# Patient Record
Sex: Female | Born: 1935
Health system: Southern US, Community
[De-identification: ages and names within clinical notes are randomized; demographics above are authoritative.]

## PROBLEM LIST (undated history)

## (undated) DIAGNOSIS — I509 Heart failure, unspecified: Secondary | ICD-10-CM

## (undated) DIAGNOSIS — N289 Disorder of kidney and ureter, unspecified: Secondary | ICD-10-CM

## (undated) DIAGNOSIS — E785 Hyperlipidemia, unspecified: Secondary | ICD-10-CM

## (undated) DIAGNOSIS — I10 Essential (primary) hypertension: Secondary | ICD-10-CM

## (undated) HISTORY — DX: Hyperlipidemia, unspecified: E78.5

## (undated) HISTORY — DX: Essential (primary) hypertension: I10

## (undated) HISTORY — PX: POLYPECTOMY: SHX149

## (undated) HISTORY — PX: COLONOSCOPY: SHX174

## (undated) HISTORY — PX: ABDOMINAL HYSTERECTOMY: SHX81

## (undated) HISTORY — PX: KNEE SURGERY: SHX244

## (undated) SURGERY — HEMIARTHROPLASTY, HIP, DIRECT ANTERIOR APPROACH, FOR FRACTURE
Anesthesia: Choice | Laterality: Right

---

## 1999-11-26 ENCOUNTER — Other Ambulatory Visit: Admission: RE | Admit: 1999-11-26 | Discharge: 1999-11-26 | Payer: Self-pay | Admitting: Obstetrics and Gynecology

## 1999-12-01 ENCOUNTER — Encounter: Admission: RE | Admit: 1999-12-01 | Discharge: 1999-12-01 | Payer: Self-pay | Admitting: Obstetrics and Gynecology

## 1999-12-01 ENCOUNTER — Encounter: Payer: Self-pay | Admitting: Obstetrics and Gynecology

## 2000-12-08 ENCOUNTER — Encounter: Payer: Self-pay | Admitting: Obstetrics and Gynecology

## 2000-12-08 ENCOUNTER — Encounter: Admission: RE | Admit: 2000-12-08 | Discharge: 2000-12-08 | Payer: Self-pay | Admitting: Obstetrics and Gynecology

## 2000-12-10 ENCOUNTER — Other Ambulatory Visit: Admission: RE | Admit: 2000-12-10 | Discharge: 2000-12-10 | Payer: Self-pay | Admitting: Obstetrics and Gynecology

## 2000-12-21 ENCOUNTER — Encounter: Admission: RE | Admit: 2000-12-21 | Discharge: 2000-12-21 | Payer: Self-pay | Admitting: Obstetrics and Gynecology

## 2000-12-21 ENCOUNTER — Encounter: Payer: Self-pay | Admitting: Obstetrics and Gynecology

## 2002-01-11 ENCOUNTER — Encounter: Admission: RE | Admit: 2002-01-11 | Discharge: 2002-01-11 | Payer: Self-pay | Admitting: Internal Medicine

## 2002-01-11 ENCOUNTER — Encounter: Payer: Self-pay | Admitting: Internal Medicine

## 2003-01-02 ENCOUNTER — Encounter: Admission: RE | Admit: 2003-01-02 | Discharge: 2003-01-02 | Payer: Self-pay | Admitting: Internal Medicine

## 2003-01-02 ENCOUNTER — Encounter: Payer: Self-pay | Admitting: Internal Medicine

## 2004-01-04 ENCOUNTER — Encounter: Admission: RE | Admit: 2004-01-04 | Discharge: 2004-01-04 | Payer: Self-pay | Admitting: Internal Medicine

## 2005-01-05 ENCOUNTER — Encounter: Admission: RE | Admit: 2005-01-05 | Discharge: 2005-01-05 | Payer: Self-pay | Admitting: Internal Medicine

## 2005-01-26 ENCOUNTER — Ambulatory Visit: Payer: Self-pay | Admitting: Internal Medicine

## 2005-02-05 ENCOUNTER — Ambulatory Visit: Payer: Self-pay | Admitting: Internal Medicine

## 2005-02-05 ENCOUNTER — Encounter (INDEPENDENT_AMBULATORY_CARE_PROVIDER_SITE_OTHER): Payer: Self-pay | Admitting: Specialist

## 2005-07-07 ENCOUNTER — Ambulatory Visit (HOSPITAL_COMMUNITY): Admission: RE | Admit: 2005-07-07 | Discharge: 2005-07-07 | Payer: Self-pay | Admitting: Internal Medicine

## 2006-01-06 ENCOUNTER — Encounter: Admission: RE | Admit: 2006-01-06 | Discharge: 2006-01-06 | Payer: Self-pay | Admitting: Internal Medicine

## 2006-04-01 ENCOUNTER — Ambulatory Visit: Payer: Self-pay | Admitting: Internal Medicine

## 2006-04-14 ENCOUNTER — Ambulatory Visit: Payer: Self-pay | Admitting: Internal Medicine

## 2006-04-14 ENCOUNTER — Encounter (INDEPENDENT_AMBULATORY_CARE_PROVIDER_SITE_OTHER): Payer: Self-pay | Admitting: Specialist

## 2007-01-10 ENCOUNTER — Encounter: Admission: RE | Admit: 2007-01-10 | Discharge: 2007-01-10 | Payer: Self-pay | Admitting: Internal Medicine

## 2008-01-11 ENCOUNTER — Encounter: Admission: RE | Admit: 2008-01-11 | Discharge: 2008-01-11 | Payer: Self-pay | Admitting: Internal Medicine

## 2009-01-11 ENCOUNTER — Encounter: Admission: RE | Admit: 2009-01-11 | Discharge: 2009-01-11 | Payer: Self-pay | Admitting: Internal Medicine

## 2009-03-07 ENCOUNTER — Encounter (INDEPENDENT_AMBULATORY_CARE_PROVIDER_SITE_OTHER): Payer: Self-pay | Admitting: *Deleted

## 2009-09-06 ENCOUNTER — Encounter (INDEPENDENT_AMBULATORY_CARE_PROVIDER_SITE_OTHER): Payer: Self-pay | Admitting: *Deleted

## 2009-09-09 ENCOUNTER — Ambulatory Visit: Payer: Self-pay | Admitting: Internal Medicine

## 2009-09-09 ENCOUNTER — Encounter (INDEPENDENT_AMBULATORY_CARE_PROVIDER_SITE_OTHER): Payer: Self-pay | Admitting: *Deleted

## 2009-09-20 ENCOUNTER — Telehealth: Payer: Self-pay | Admitting: Internal Medicine

## 2010-01-13 ENCOUNTER — Encounter: Admission: RE | Admit: 2010-01-13 | Discharge: 2010-01-13 | Payer: Self-pay | Admitting: Internal Medicine

## 2010-07-15 NOTE — Letter (Signed)
Summary: Diabetic Instructions  [facility]  [place], [place] [address]   Phone: [phone]  Fax: [phone]    Abigail Huang 1930/08/06 MRN: [id number]   _  _   ORAL DIABETIC MEDICATION INSTRUCTIONS  The day before your procedure:   Take your diabetic pill as you do normally  The day of your procedure:   Do not take your diabetic pill    We will check your blood sugar levels during the admission process and again in Recovery before discharging you home  ________________________________________________________________________

## 2010-07-15 NOTE — Letter (Signed)
Summary: [facility] Instructions  [facility]  [place], [place] [address]   Phone: [phone]  Fax: [phone]       SHANEN MACFADDEN    [birth year]    MRN: [id number]        Procedure Day Sudie Grumbling:  Wednesday 09/25/2009     Arrival Time: 7:30 am      Procedure Time: 8:30 am     Location of Procedure:                    _x _  [place] (4th Floor)                        [place]   Starting 5 days prior to your procedure Friday 4/8 do not eat nuts, seeds, popcorn, corn, beans, peas,  salads, or any raw vegetables.  Do not take any fiber supplements (e.g. Metamucil, Citrucel, and Benefiber).  THE DAY BEFORE YOUR PROCEDURE         DATE: Tuesday 4/12  1.  Drink clear liquids the entire day-NO SOLID FOOD  2.  Do not drink anything colored red or purple.  Avoid juices with pulp.  No orange juice.  3.  Drink at least 64 oz. (8 glasses) of fluid/clear liquids during the day to prevent dehydration and help the prep work efficiently.  CLEAR LIQUIDS INCLUDE: Water Jello Ice Popsicles Tea (sugar ok, no milk/cream) Powdered fruit flavored drinks Coffee (sugar ok, no milk/cream) Gatorade Juice: apple, white grape, white cranberry  Lemonade Clear bullion, consomm, broth Carbonated beverages (any kind) Strained chicken noodle soup Hard Candy                             4.  In the morning, mix first dose of MoviPrep solution:    Empty 1 Pouch A and 1 Pouch B into the disposable container    Add lukewarm drinking water to the top line of the container. Mix to dissolve    Refrigerate (mixed solution should be used within 24 hrs)  5.  Begin drinking the prep at 5:00 p.m. The MoviPrep container is divided by 4 marks.   Every 15 minutes drink the solution down to the next mark (approximately 8 oz) until the full liter is complete.   6.  Follow completed prep with 16 oz of clear liquid of your choice (Nothing  red or purple).  Continue to drink clear liquids until bedtime.  7.  Before going to bed, mix second dose of MoviPrep solution:    Empty 1 Pouch A and 1 Pouch B into the disposable container    Add lukewarm drinking water to the top line of the container. Mix to dissolve    Refrigerate  THE DAY OF YOUR PROCEDURE      DATE: Wednesday 4/13  Beginning at 3:30 a.m. (5 hours before procedure):         1. Every 15 minutes, drink the solution down to the next mark (approx 8 oz) until the full liter is complete.  2. Follow completed prep with 16 oz. of clear liquid of your choice.    3. You may drink clear liquids until 6:30 am (2 HOURS BEFORE PROCEDURE).   MEDICATION INSTRUCTIONS  Unless otherwise instructed, you should take regular prescription medications with a small sip of water   as early as possible the morning of  your procedure.  Diabetic patients - see separate instructions.    Additional medication instructions:  Do not take Triam/HCTZ morning of procedure.         OTHER INSTRUCTIONS  You will need a responsible adult at least 75 years of age to accompany you and drive you home.   This person must remain in the waiting room during your procedure.  Wear loose fitting clothing that is easily removed.  Leave jewelry and other valuables at home.  However, you may wish to bring a book to read or  an iPod/MP3 player to listen to music as you wait for your procedure to start.  Remove all body piercing jewelry and leave at home.  Total time from sign-in until discharge is approximately 2-3 hours.  You should go home directly after your procedure and rest.  You can resume normal activities the  day after your procedure.  The day of your procedure you should not:   Drive   Make legal decisions   Operate machinery   Drink alcohol   Return to work  You will receive specific instructions about eating, activities and medications before you leave.    The above  instructions have been reviewed and explained to me by   Ulice Dash RN  September 09, 2009 11:12 AM     I fully understand and can verbalize these instructions _____________________________ Date _________

## 2010-07-15 NOTE — Miscellaneous (Signed)
Summary: [facility]  Clinical Lists Changes  Medications: Added new medication of MOVIPREP 100 GM  SOLR (PEG-KCL-NACL-NASULF-NA ASC-C) As per prep instructions. - Signed Rx of MOVIPREP 100 GM  SOLR (PEG-KCL-NACL-NASULF-NA ASC-C) As per prep instructions.;  #1 x 0;  Signed;  Entered by: Ulice Dash RN;  Authorized by: Irene Shipper MD;  Method used: Print then Give to Patient Observations: Added new observation of NKA: T (09/09/2009 10:46)    Prescriptions: MOVIPREP 100 GM  SOLR (PEG-KCL-NACL-NASULF-NA ASC-C) As per prep instructions.  #1 x 0   Entered by:   Ulice Dash RN   Authorized by:   Irene Shipper MD   Signed by:   Ulice Dash RN on 09/09/2009   Method used:   Print then Give to Patient   RxID:   [id number]

## 2010-07-15 NOTE — Progress Notes (Signed)
Summary: Moviprep too expensive  Phone Note Call from Patient Call back at [facility] Phone [phone]   Caller: Patient Call For: Dr. Henrene Pastor Reason for Call: Talk to Nurse Summary of Call: Moviprep is too expensive. Needs something less expensive Initial call taken by: Webb Laws,  September 20, 2009 10:45 AM    Offered pt. rebate coupon toward the cost of her Movi prep or miralax prep.  Explained to pt. she would need to come by for new prep instructions.  Pt. decided to cancel procedure for now and will do at a later time.

## 2010-08-25 ENCOUNTER — Encounter (INDEPENDENT_AMBULATORY_CARE_PROVIDER_SITE_OTHER): Payer: Self-pay | Admitting: *Deleted

## 2010-09-02 NOTE — Letter (Signed)
Summary: Pre Visit Letter Revised  [facility]  [place], [place] [address]   Phone: [phone]  Fax: [phone]        08/25/2010 MRN: [id number] Abigail Huang [address] [place], [place]  [address]             Procedure Date:  October 09, 2010   recall col-Dr Dora Sims to the Gastroenterology Division at [facility].    You are scheduled to see a nurse for your pre-procedure visit on September 25, 2010 at 9:00am on the 3rd floor at [facility] [place] [facility].  We ask that you try to arrive at our office 15 minutes prior to your appointment time to allow for check-in.  Please take a minute to review the attached form.  If you answer "Yes" to one or more of the questions on the first page, we ask that you call the person listed at your earliest opportunity.  If you answer "No" to all of the questions, please complete the rest of the form and bring it to your appointment.    Your nurse visit will consist of discussing your medical and surgical history, your immediate family medical history, and your medications.   If you are unable to list all of your medications on the form, please bring the medication bottles to your appointment and we will list them.  We will need to be aware of both prescribed and over the counter drugs.  We will need to know exact dosage information as well.    Please be prepared to read and sign documents such as consent forms, a financial agreement, and acknowledgement forms.  If necessary, and with your consent, a friend or relative is welcome to sit-in on the nurse visit with you.  Please bring your insurance card so that we may make a copy of it.  If your insurance requires a referral to see a specialist, please bring your referral form from your primary care physician.  No co-pay is required for this nurse visit.     If you cannot keep your appointment, please call [phone] to cancel or reschedule prior to your  appointment date.  This allows [place] the opportunity to schedule an appointment for another patient in need of care.    Thank you for choosing [facility] for your medical needs.  We appreciate the opportunity to care for you.  Please visit [place] at our website  to learn more about our practice.  Sincerely, The Gastroenterology Division

## 2010-09-25 ENCOUNTER — Ambulatory Visit (AMBULATORY_SURGERY_CENTER): Payer: Medicare HMO

## 2010-09-25 VITALS — Ht 67.0 in | Wt 153.3 lb

## 2010-09-25 DIAGNOSIS — Z86010 Personal history of colonic polyps: Secondary | ICD-10-CM

## 2010-09-25 MED ORDER — PEG-KCL-NACL-NASULF-NA ASC-C 100 G PO SOLR: ORAL | Status: DC

## 2010-09-26 ENCOUNTER — Telehealth: Payer: Self-pay | Admitting: *Deleted

## 2010-09-26 MED ORDER — PEG-KCL-NACL-NASULF-NA ASC-C 100 G PO SOLR: ORAL | Status: DC

## 2010-09-26 NOTE — Telephone Encounter (Signed)
Patient's insurance will not pay would need prior auth for Moviprep. Cancelled rx with [employer]. Sample given to patient

## 2010-10-08 ENCOUNTER — Encounter: Payer: Self-pay | Admitting: Internal Medicine

## 2010-10-09 ENCOUNTER — Encounter: Payer: Self-pay | Admitting: Internal Medicine

## 2010-10-09 ENCOUNTER — Ambulatory Visit (AMBULATORY_SURGERY_CENTER): Payer: Medicare HMO | Admitting: Internal Medicine

## 2010-10-09 VITALS — BP 175/81 | HR 73 | Temp 96.8°F | Resp 18 | Ht 67.0 in | Wt 153.0 lb

## 2010-10-09 DIAGNOSIS — K648 Other hemorrhoids: Secondary | ICD-10-CM

## 2010-10-09 DIAGNOSIS — Z86010 Personal history of colonic polyps: Secondary | ICD-10-CM

## 2010-10-09 DIAGNOSIS — Z1211 Encounter for screening for malignant neoplasm of colon: Secondary | ICD-10-CM

## 2010-10-09 LAB — GLUCOSE, CAPILLARY
Glucose-Capillary: 185 mg/dL — ABNORMAL HIGH (ref 70–99)
Glucose-Capillary: 167 mg/dL — ABNORMAL HIGH (ref 70–99)

## 2010-10-09 MED ORDER — SODIUM CHLORIDE 0.9 % IV SOLN: 500.0000 mL | INTRAVENOUS | Status: DC

## 2010-10-09 NOTE — Patient Instructions (Signed)
Discharged instructions given with verbal understanding. Handout on hemorrhoids given. Resume previous medications.

## 2010-10-09 NOTE — Progress Notes (Signed)
Pt discharged from monitor in procedure room prior to printing vs strip. VSS throughout procedure, last bp 180/88. SR rate in the 70's. RR 14, sats 100%

## 2010-10-10 ENCOUNTER — Telehealth: Payer: Self-pay

## 2010-10-10 NOTE — Telephone Encounter (Signed)

## 2010-12-26 ENCOUNTER — Other Ambulatory Visit: Payer: Self-pay | Admitting: Internal Medicine

## 2010-12-29 ENCOUNTER — Other Ambulatory Visit: Payer: Self-pay | Admitting: Internal Medicine

## 2010-12-29 ENCOUNTER — Ambulatory Visit
Admission: RE | Admit: 2010-12-29 | Discharge: 2010-12-29 | Disposition: A | Discharge status: Home / Self Care | Payer: Medicare HMO | Source: Ambulatory Visit | Attending: Internal Medicine | Admitting: Internal Medicine

## 2011-02-09 ENCOUNTER — Other Ambulatory Visit: Payer: Self-pay | Admitting: Internal Medicine

## 2011-02-09 DIAGNOSIS — Z1231 Encounter for screening mammogram for malignant neoplasm of breast: Secondary | ICD-10-CM

## 2011-02-12 ENCOUNTER — Ambulatory Visit
Admission: RE | Admit: 2011-02-12 | Discharge: 2011-02-12 | Disposition: A | Discharge status: Home / Self Care | Payer: Medicare HMO | Source: Ambulatory Visit | Attending: Internal Medicine | Admitting: Internal Medicine

## 2011-02-12 ENCOUNTER — Ambulatory Visit: Payer: Medicare HMO

## 2011-02-12 DIAGNOSIS — Z1231 Encounter for screening mammogram for malignant neoplasm of breast: Secondary | ICD-10-CM

## 2011-06-06 ENCOUNTER — Other Ambulatory Visit: Payer: Self-pay

## 2011-06-06 ENCOUNTER — Emergency Department (HOSPITAL_COMMUNITY)
Admission: EM | Admit: 2011-06-06 | Discharge: 2011-06-06 | Disposition: A | Discharge status: Home / Self Care | Payer: Medicare HMO | Attending: Emergency Medicine | Admitting: Emergency Medicine

## 2011-06-06 ENCOUNTER — Encounter (HOSPITAL_COMMUNITY): Payer: Self-pay | Admitting: *Deleted

## 2011-06-06 ENCOUNTER — Emergency Department (HOSPITAL_COMMUNITY): Payer: Medicare HMO

## 2011-06-06 DIAGNOSIS — I10 Essential (primary) hypertension: Secondary | ICD-10-CM | POA: Insufficient documentation

## 2011-06-06 DIAGNOSIS — J40 Bronchitis, not specified as acute or chronic: Secondary | ICD-10-CM | POA: Insufficient documentation

## 2011-06-06 DIAGNOSIS — R079 Chest pain, unspecified: Secondary | ICD-10-CM | POA: Insufficient documentation

## 2011-06-06 DIAGNOSIS — Z79899 Other long term (current) drug therapy: Secondary | ICD-10-CM | POA: Insufficient documentation

## 2011-06-06 DIAGNOSIS — R059 Cough, unspecified: Secondary | ICD-10-CM | POA: Insufficient documentation

## 2011-06-06 DIAGNOSIS — E119 Type 2 diabetes mellitus without complications: Secondary | ICD-10-CM | POA: Insufficient documentation

## 2011-06-06 DIAGNOSIS — R05 Cough: Secondary | ICD-10-CM | POA: Insufficient documentation

## 2011-06-06 DIAGNOSIS — E785 Hyperlipidemia, unspecified: Secondary | ICD-10-CM | POA: Insufficient documentation

## 2011-06-06 MED ORDER — AEROCHAMBER Z-STAT PLUS/MEDIUM MISC
1.0000 | Freq: Once | Status: AC
  Administered 2011-06-06: 1
  Filled 2011-06-06: qty 1

## 2011-06-06 MED ORDER — ALBUTEROL SULFATE HFA 108 (90 BASE) MCG/ACT IN AERS
2.0000 | INHALATION_SPRAY | RESPIRATORY_TRACT | Status: DC | PRN
  Administered 2011-06-06: 2 via RESPIRATORY_TRACT
  Filled 2011-06-06: qty 6.7

## 2011-06-06 NOTE — ED Provider Notes (Signed)
History     CSN: [id number]  Arrival date & time 06/06/11  1510   First MD Initiated Contact with Patient 06/06/11 1740      Chief Complaint  Patient presents with  . Chest Pain    (Consider location/radiation/quality/duration/timing/severity/associated sxs/prior treatment) Patient is a 77 y.o. female presenting with chest pain. The history is provided by the patient and a relative.  Chest Pain Episode onset: 4 days ago. Chest pain occurs intermittently. The chest pain is unchanged. Associated with: Intermittent para, chest fullness lasting a few seconds, in the last several days. Quality: Full feeling. The pain does not radiate. Primary symptoms include cough (Productive of green, and yellow mucus). Pertinent negatives for primary symptoms include no fever, no fatigue, no syncope, no wheezing, no nausea and no vomiting.  Pertinent negatives for associated symptoms include no diaphoresis and no orthopnea. Treatments tried: Treated with an oral antibiotic and oral cough meds.  Pertinent negatives for past medical history include no aneurysm, no aortic dissection, no arrhythmia, no MI and no PE.     Past Medical History  Diagnosis Date  . Diabetes mellitus   . Hyperlipidemia   . Hypertension     Past Surgical History  Procedure Date  . Abdominal hysterectomy   . Colonoscopy   . Polypectomy     Family History  Problem Relation Age of Onset  . Heart disease Mother   . Heart disease Father   . Heart disease Sister     History  Substance Use Topics  . Smoking status: Former Smoker    Quit date: 06/27/1967  . Smokeless tobacco: Never Used  . Alcohol Use: No    OB History    Grav Para Term Preterm Abortions TAB SAB Ect Mult Living                  Review of Systems  Constitutional: Negative for fever, diaphoresis and fatigue.  Respiratory: Positive for cough (Productive of green, and yellow mucus). Negative for wheezing.   Cardiovascular: Positive for chest pain.  Negative for orthopnea and syncope.  Gastrointestinal: Negative for nausea and vomiting.  All other systems reviewed and are negative.    Allergies  Review of patient's allergies indicates no known allergies.  Home Medications   Current Outpatient Rx  Name Route Sig Dispense Refill  . ATENOLOL 50 MG PO TABS Oral Take 50 mg by mouth daily.     Marland Kitchen CALTRATE 600+D PO Oral Take 1 tablet by mouth.      . TYLENOL PM EXTRA STRENGTH PO Oral Take 1 tablet by mouth at bedtime as needed. For sleep     . ESTRADIOL 1 MG PO TABS Oral Take 1 mg by mouth daily.     Marland Kitchen GLIPIZIDE ER 10 MG PO TB24 Oral Take 10 mg by mouth daily.     Marland Kitchen LISINOPRIL 20 MG PO TABS Oral Take 20 mg by mouth daily.     Marland Kitchen METFORMIN HCL 1000 MG PO TABS Oral Take 1,000 mg by mouth 2 (two) times daily before a meal.     . NIACIN 500 MG PO TABS Oral Take 500 mg by mouth daily with breakfast.      . SIMVASTATIN 20 MG PO TABS Oral Take 20 mg by mouth at bedtime.      . TRIAMTERENE-HCTZ 75-50 MG PO TABS Oral Take 1 tablet by mouth daily.       BP 177/70  Pulse 71  Temp(Src) 97.7 F (36.5 C) (Oral)  Resp 16  SpO2 100%  Physical Exam  Nursing note and vitals reviewed. Constitutional: She is oriented to person, place, and time. She appears well-developed and well-nourished. No distress.  HENT:  Head: Normocephalic and atraumatic.  Right Ear: External ear normal.  Left Ear: External ear normal.  Nose: Nose normal.  Mouth/Throat: Oropharynx is clear and moist. No oropharyngeal exudate.  Eyes: Conjunctivae and lids are normal. Pupils are equal, round, and reactive to light.  Neck: Trachea normal, normal range of motion and phonation normal. Neck supple. No Brudzinski's sign noted.  Cardiovascular: Normal rate, regular rhythm, normal heart sounds and intact distal pulses.   Pulmonary/Chest: Effort normal and breath sounds normal. No accessory muscle usage. No respiratory distress. She has no wheezes. She has no rales.  Abdominal:  Soft. Bowel sounds are normal. She exhibits no distension and no mass. There is no tenderness.  Musculoskeletal: Normal range of motion. She exhibits no edema and no tenderness.       Right upper arm: Normal.       Left upper arm: Normal.       Right upper leg: Normal.       Left upper leg: Normal.       Right lower leg: Normal.       Left lower leg: Normal.  Lymphadenopathy:       Head (right side): No submandibular and no preauricular adenopathy present.       Head (left side): No submandibular and no preauricular adenopathy present.       Right cervical: No deep cervical adenopathy present.      Left cervical: No deep cervical adenopathy present.  Neurological: She is alert and oriented to person, place, and time. No cranial nerve deficit or sensory deficit. She exhibits normal muscle tone. Coordination normal.  Skin: Skin is warm and dry. No rash noted. No erythema. No pallor.  Psychiatric: She has a normal mood and affect. Her behavior is normal. Judgment and thought content normal.    ED Course  Procedures (including critical care time)  Labs Reviewed - No data to display Dg Chest 2 View  06/06/2011  *RADIOLOGY REPORT*  Clinical Data: Chest pain, cough.  Shortness of breath.  CHEST - 2 VIEW  Comparison: None.  Findings: Heart and mediastinal contours are within normal limits. No focal opacities or effusions.  No acute bony abnormality.  IMPRESSION: No active cardiopulmonary disease.  Original Report Authenticated By: Raelyn Number, M.D.     1. Bronchitis       MDM  Patient with a reassuring chest x-ray, with persistent cough, productive after antibiotic treatment. This most likely represents a bronchitis. Which may improve with time and/or bronchodilator therapy. She does not have COPD       Richarda Blade, MD 06/06/11 [phone]

## 2011-06-06 NOTE — ED Notes (Signed)
Patient with chest congestion for about three weeks and now having chest discomfort.  Patient was taking antibiotics for URI and symptoms are not any better

## 2011-12-03 ENCOUNTER — Emergency Department (HOSPITAL_BASED_OUTPATIENT_CLINIC_OR_DEPARTMENT_OTHER): Payer: Medicare HMO

## 2011-12-03 ENCOUNTER — Encounter (HOSPITAL_BASED_OUTPATIENT_CLINIC_OR_DEPARTMENT_OTHER): Payer: Self-pay | Admitting: *Deleted

## 2011-12-03 ENCOUNTER — Emergency Department (HOSPITAL_BASED_OUTPATIENT_CLINIC_OR_DEPARTMENT_OTHER)
Admission: EM | Admit: 2011-12-03 | Discharge: 2011-12-03 | Disposition: A | Discharge status: Home / Self Care | Payer: Medicare HMO | Attending: Emergency Medicine | Admitting: Emergency Medicine

## 2011-12-03 DIAGNOSIS — S0003XA Contusion of scalp, initial encounter: Secondary | ICD-10-CM | POA: Insufficient documentation

## 2011-12-03 DIAGNOSIS — W010XXA Fall on same level from slipping, tripping and stumbling without subsequent striking against object, initial encounter: Secondary | ICD-10-CM | POA: Insufficient documentation

## 2011-12-03 DIAGNOSIS — H5710 Ocular pain, unspecified eye: Secondary | ICD-10-CM | POA: Insufficient documentation

## 2011-12-03 DIAGNOSIS — I10 Essential (primary) hypertension: Secondary | ICD-10-CM | POA: Insufficient documentation

## 2011-12-03 DIAGNOSIS — E785 Hyperlipidemia, unspecified: Secondary | ICD-10-CM | POA: Insufficient documentation

## 2011-12-03 DIAGNOSIS — S0083XA Contusion of other part of head, initial encounter: Secondary | ICD-10-CM

## 2011-12-03 DIAGNOSIS — Y9301 Activity, walking, marching and hiking: Secondary | ICD-10-CM | POA: Insufficient documentation

## 2011-12-03 DIAGNOSIS — E119 Type 2 diabetes mellitus without complications: Secondary | ICD-10-CM | POA: Insufficient documentation

## 2011-12-03 DIAGNOSIS — R51 Headache: Secondary | ICD-10-CM | POA: Insufficient documentation

## 2011-12-03 NOTE — ED Notes (Signed)
Tripped and fell onto gravel driveway. Abrasions swelling and pain to her face.

## 2011-12-03 NOTE — ED Notes (Signed)
Pt states she had an xray of her nose earlier today after the fall and the xray was negative. Her daughter is a Marine scientist in [place] and would not rest until she came to the ED for evaluation.

## 2011-12-03 NOTE — Discharge Instructions (Signed)

## 2011-12-03 NOTE — ED Provider Notes (Signed)
History     CSN: [id number]  Arrival date & time 12/03/11  2155   First MD Initiated Contact with Patient 12/03/11 2210      Chief Complaint  Patient presents with  . Fall    (Consider location/radiation/quality/duration/timing/severity/associated sxs/prior treatment) Patient is a 78 y.o. female presenting with fall. The history is provided by the patient.  Fall The accident occurred 3 to 5 hours ago. The fall occurred while walking (tripped and fell face first into the gravel). She fell from a height of 1 to 2 ft. She landed on a hard floor. The volume of blood lost was minimal. Point of impact: face. Pain location: face. The pain is at a severity of 5/10. The pain is moderate. She was ambulatory at the scene. Pertinent negatives include no visual change, no numbness, no nausea, no vomiting, no headaches, no loss of consciousness and no tingling. Exacerbated by: nothing. She has tried ice and acetaminophen for the symptoms. The treatment provided moderate relief.    Past Medical History  Diagnosis Date  . Diabetes mellitus   . Hyperlipidemia   . Hypertension     Past Surgical History  Procedure Date  . Abdominal hysterectomy   . Colonoscopy   . Polypectomy     Family History  Problem Relation Age of Onset  . Heart disease Mother   . Heart disease Father   . Heart disease Sister     History  Substance Use Topics  . Smoking status: Former Smoker    Quit date: 06/27/1967  . Smokeless tobacco: Never Used  . Alcohol Use: No    OB History    Grav Para Term Preterm Abortions TAB SAB Ect Mult Living                  Review of Systems  Gastrointestinal: Negative for nausea and vomiting.  Neurological: Negative for tingling, loss of consciousness, syncope, weakness, numbness and headaches.  All other systems reviewed and are negative.    Allergies  Review of patient's allergies indicates no known allergies.  Home Medications   Current Outpatient Rx  Name  Route Sig Dispense Refill  . ATENOLOL 50 MG PO TABS Oral Take 50 mg by mouth daily.     Marland Kitchen CALTRATE 600+D PO Oral Take 1 tablet by mouth daily.     Marland Kitchen ESTRADIOL 1 MG PO TABS Oral Take 1 mg by mouth daily.     Marland Kitchen GLIPIZIDE ER 10 MG PO TB24 Oral Take 10 mg by mouth daily.     Marland Kitchen LISINOPRIL 20 MG PO TABS Oral Take 20 mg by mouth daily.     Marland Kitchen METFORMIN HCL 1000 MG PO TABS Oral Take 1,000 mg by mouth 2 (two) times daily before a meal.     . NIACIN 500 MG PO TABS Oral Take 500 mg by mouth daily with breakfast.      . SIMVASTATIN 20 MG PO TABS Oral Take 20 mg by mouth daily.     . TRIAMTERENE-HCTZ 75-50 MG PO TABS Oral Take 1 tablet by mouth daily.       BP 174/77  Pulse 80  Temp 98.1 F (36.7 C) (Oral)  Resp 18  SpO2 99%  Physical Exam  Nursing note and vitals reviewed. Constitutional: She is oriented to person, place, and time. She appears well-developed and well-nourished. No distress.  HENT:  Head: Normocephalic. Head is with raccoon's eyes, with abrasion and with contusion.    Mouth/Throat: Oropharynx is clear and  moist.  Eyes: Conjunctivae and EOM are normal. Pupils are equal, round, and reactive to light.  Neck: Normal range of motion. Neck supple. No spinous process tenderness and no muscular tenderness present.  Musculoskeletal: Normal range of motion. She exhibits no edema and no tenderness.       Arms: Neurological: She is alert and oriented to person, place, and time.  Skin: Skin is warm and dry. No rash noted. No erythema.  Psychiatric: She has a normal mood and affect. Her behavior is normal.    ED Course  Procedures (including critical care time)  Labs Reviewed - No data to display Ct Head Wo Contrast  12/03/2011  *RADIOLOGY REPORT*  Clinical Data:  Fall.  Head trauma.  Headache and orbital pain and bruising.  CT HEAD WITHOUT CONTRAST CT MAXILLOFACIAL WITHOUT CONTRAST  Technique:  Multidetector CT imaging of the head and maxillofacial structures were performed using the  standard protocol without intravenous contrast. Multiplanar CT image reconstructions of the maxillofacial structures were also generated.  Comparison:   None.  CT HEAD  Findings: There is no evidence of intracranial hemorrhage, brain edema or other signs of acute infarction.  There is no evidence of intracranial mass lesion or mass effect.  No abnormal extra-axial fluid collections are identified.  Ventricles are normal in size.  Mild chronic small vessel disease is noted.  No skull abnormality identified.  IMPRESSION:  1.  No acute intracranial abnormality. 2.  Mild chronic small vessel disease.  CT MAXILLOFACIAL  Findings:   No evidence of orbital or facial bone fracture.  Globes and intraorbital contents are normal appearance.  No evidence of orbital emphysema or sinus air fluid levels.  No other significant bone abnormality identified.  IMPRESSION: Negative.  No evidence of orbital or facial bone fracture.  Original Report Authenticated By: Marlaine Hind, M.D.   Ct Maxillofacial Wo Cm  12/03/2011  *RADIOLOGY REPORT*  Clinical Data:  Fall.  Head trauma.  Headache and orbital pain and bruising.  CT HEAD WITHOUT CONTRAST CT MAXILLOFACIAL WITHOUT CONTRAST  Technique:  Multidetector CT imaging of the head and maxillofacial structures were performed using the standard protocol without intravenous contrast. Multiplanar CT image reconstructions of the maxillofacial structures were also generated.  Comparison:   None.  CT HEAD  Findings: There is no evidence of intracranial hemorrhage, brain edema or other signs of acute infarction.  There is no evidence of intracranial mass lesion or mass effect.  No abnormal extra-axial fluid collections are identified.  Ventricles are normal in size.  Mild chronic small vessel disease is noted.  No skull abnormality identified.  IMPRESSION:  1.  No acute intracranial abnormality. 2.  Mild chronic small vessel disease.  CT MAXILLOFACIAL  Findings:   No evidence of orbital or facial  bone fracture.  Globes and intraorbital contents are normal appearance.  No evidence of orbital emphysema or sinus air fluid levels.  No other significant bone abnormality identified.  IMPRESSION: Negative.  No evidence of orbital or facial bone fracture.  Original Report Authenticated By: Marlaine Hind, M.D.     1. Facial contusion       MDM   Patient with a mechanical fall earlier today where she fell face first into the pavement. She denies LOC and is not on blood thinners however she was very concerned about possible bleeding in the brain. She denies any neck pain but does have raccoon eyes on exam and tenderness over her orbital bones. CT of the head and  face are negative for acute injury. This was relayed to the patient she was discharged home        Blanchie Dessert, MD 12/03/11 2334

## 2012-02-29 ENCOUNTER — Other Ambulatory Visit: Payer: Self-pay | Admitting: Internal Medicine

## 2012-02-29 DIAGNOSIS — Z1231 Encounter for screening mammogram for malignant neoplasm of breast: Secondary | ICD-10-CM

## 2012-03-08 ENCOUNTER — Emergency Department (HOSPITAL_COMMUNITY)
Admission: EM | Admit: 2012-03-08 | Discharge: 2012-03-08 | Disposition: A | Discharge status: Home / Self Care | Payer: Medicare HMO | Attending: Emergency Medicine | Admitting: Emergency Medicine

## 2012-03-08 ENCOUNTER — Emergency Department (HOSPITAL_COMMUNITY): Payer: Medicare HMO

## 2012-03-08 ENCOUNTER — Encounter (HOSPITAL_COMMUNITY): Payer: Self-pay | Admitting: Emergency Medicine

## 2012-03-08 DIAGNOSIS — R42 Dizziness and giddiness: Secondary | ICD-10-CM | POA: Insufficient documentation

## 2012-03-08 DIAGNOSIS — Z79899 Other long term (current) drug therapy: Secondary | ICD-10-CM | POA: Insufficient documentation

## 2012-03-08 DIAGNOSIS — H539 Unspecified visual disturbance: Secondary | ICD-10-CM | POA: Insufficient documentation

## 2012-03-08 DIAGNOSIS — E119 Type 2 diabetes mellitus without complications: Secondary | ICD-10-CM | POA: Insufficient documentation

## 2012-03-08 DIAGNOSIS — I10 Essential (primary) hypertension: Secondary | ICD-10-CM | POA: Insufficient documentation

## 2012-03-08 LAB — TROPONIN I: Troponin I: <0.3 ng/mL (ref <0.30)

## 2012-03-08 LAB — COMPREHENSIVE METABOLIC PANEL
Albumin: 3.9 g/dL (ref 3.5–5.2)
Alkaline Phosphatase: 40 U/L (ref 39–117)
BUN: 26 mg/dL — ABNORMAL HIGH (ref 6–23)
CO2: 21 mEq/L (ref 19–32)
Chloride: 102 mEq/L (ref 96–112)
Creatinine, Ser: 1.58 mg/dL — ABNORMAL HIGH (ref 0.50–1.10)
GFR calc non Af Amer: 30 mL/min — ABNORMAL LOW (ref >90)
Potassium: 4.8 mEq/L (ref 3.5–5.1)
Total Bilirubin: 0.3 mg/dL (ref 0.3–1.2)

## 2012-03-08 LAB — URINALYSIS, ROUTINE W REFLEX MICROSCOPIC
Bilirubin Urine: NEGATIVE
Glucose, UA: 100 mg/dL — AB
Ketones, ur: NEGATIVE mg/dL
Protein, ur: NEGATIVE mg/dL
pH: 5 (ref 5.0–8.0)

## 2012-03-08 LAB — CBC WITH DIFFERENTIAL/PLATELET
Basophils Relative: 0 % (ref 0–1)
HCT: 38.5 % (ref 36.0–46.0)
Hemoglobin: 12.7 g/dL (ref 12.0–15.0)
Lymphocytes Relative: 26 % (ref 12–46)
Lymphs Abs: 2.2 10*3/uL (ref 0.7–4.0)
MCHC: 33 g/dL (ref 30.0–36.0)
Monocytes Absolute: 0.7 10*3/uL (ref 0.1–1.0)
Monocytes Relative: 9 % (ref 3–12)
Neutro Abs: 5.4 10*3/uL (ref 1.7–7.7)
Neutrophils Relative %: 64 % (ref 43–77)
RBC: 4.18 MIL/uL (ref 3.87–5.11)
WBC: 8.5 10*3/uL (ref 4.0–10.5)

## 2012-03-08 MED ORDER — SODIUM CHLORIDE 0.9 % IV BOLUS (SEPSIS)
500.0000 mL | Freq: Once | INTRAVENOUS | Status: AC
  Administered 2012-03-08: 500 mL via INTRAVENOUS

## 2012-03-08 NOTE — ED Notes (Addendum)
Pt didn't want to have blood drawn at this time. Reports that she had blood drawn less than a week ago. Pt wants to sit with her husband who is in the main waiting room. Nurse first aware.

## 2012-03-08 NOTE — ED Notes (Signed)
Pt informed of wait times. 

## 2012-03-08 NOTE — ED Notes (Addendum)
Pt c/o vision change x 1 episode 1 month ago. 3 days ago pt reports felt like something was over her eyes and she became dizzy. A third episode occurred last night and vision remains blurry at this time.

## 2012-03-08 NOTE — ED Provider Notes (Signed)
History     CSN: [id number]  Arrival date & time 03/08/12  1132   First MD Initiated Contact with Patient 03/08/12 1500      Chief Complaint  Patient presents with  . Dizziness     HPI The patient presents after several episodes of visual changes.  She was the first episode began insidiously, with transient, consisted of darkening of her visual fields acuity loss.  It lasted briefly, resolved spontaneously.  Yesterday she had a similar episode.  She notes that since the event last night she's had persistent headache.  Headache is frontal, throbbing, nonradiating.  She notes that her episode of visual field loss has resolved, and she currently only complains of a vague bilateral blurriness.  No visual pain. No current fever, chills, cough, ataxia, disequilibrium or discoordination. No clear precipitating, alleviating, exacerbating factors. The patient has a family history of prior brain tumor. Past Medical History  Diagnosis Date  . Diabetes mellitus   . Hyperlipidemia   . Hypertension     Past Surgical History  Procedure Date  . Abdominal hysterectomy   . Colonoscopy   . Polypectomy     Family History  Problem Relation Age of Onset  . Heart disease Mother   . Heart disease Father   . Heart disease Sister     History  Substance Use Topics  . Smoking status: Former Smoker    Quit date: 06/27/1967  . Smokeless tobacco: Never Used  . Alcohol Use: No    OB History    Grav Para Term Preterm Abortions TAB SAB Ect Mult Living                  Review of Systems  Constitutional:       HPI  HENT:       HPI otherwise negative  Eyes: Negative.   Respiratory:       HPI, otherwise negative  Cardiovascular:       HPI, otherwise nmegative  Gastrointestinal: Negative for vomiting.  Genitourinary:       HPI, otherwise negative  Musculoskeletal:       HPI, otherwise negative  Skin: Negative.   Neurological: Positive for headaches. Negative for dizziness, tremors,  seizures, syncope, facial asymmetry, speech difficulty, weakness, light-headedness and numbness.    Allergies  Review of patient's allergies indicates no known allergies.  Home Medications   Current Outpatient Rx  Name Route Sig Dispense Refill  . ATENOLOL 50 MG PO TABS Oral Take 50 mg by mouth daily.     Marland Kitchen CALTRATE 600+D PO Oral Take 1 tablet by mouth daily.     Marland Kitchen VITAMIN D 1000 UNITS PO TABS Oral Take 1,000 Units by mouth daily.    Marland Kitchen ESTRADIOL 1 MG PO TABS Oral Take 1 mg by mouth daily.     Marland Kitchen GLIPIZIDE ER 10 MG PO TB24 Oral Take 10 mg by mouth daily.     Marland Kitchen LISINOPRIL 20 MG PO TABS Oral Take 20 mg by mouth daily.     Marland Kitchen METFORMIN HCL 1000 MG PO TABS Oral Take 1,000 mg by mouth 2 (two) times daily before a meal.     . CENTRUM SILVER PO Oral Take 1 tablet by mouth daily.    Marland Kitchen NIACIN 500 MG PO TABS Oral Take 500 mg by mouth daily with breakfast.      . SIMVASTATIN 20 MG PO TABS Oral Take 20 mg by mouth daily.     . TRIAMTERENE-HCTZ 75-50 MG PO  TABS Oral Take 1 tablet by mouth daily.       BP 162/60  Pulse 78  Temp 98.7 F (37.1 C) (Oral)  Resp 16  Ht 5\' 6"  (1.676 m)  Wt 150 lb (68.04 kg)  BMI 24.21 kg/m2  SpO2 98%  Physical Exam  Nursing note and vitals reviewed. Constitutional: She is oriented to person, place, and time. She appears well-developed and well-nourished. No distress.  HENT:  Head: Normocephalic and atraumatic.  Eyes: Conjunctivae normal and EOM are normal. No scleral icterus. Right eye exhibits normal extraocular motion and no nystagmus. Left eye exhibits normal extraocular motion and no nystagmus. Right pupil is round and reactive. Left pupil is round and reactive. Pupils are equal.       On funduscopic exam there no gross hemorrhage, arteries and veins appear appropriate.  Cardiovascular: Normal rate and regular rhythm.   Pulmonary/Chest: Effort normal and breath sounds normal. No stridor. No respiratory distress.  Abdominal: She exhibits no distension.    Musculoskeletal: She exhibits no edema.  Neurological: She is alert and oriented to person, place, and time. No cranial nerve deficit.  Skin: Skin is warm and dry.  Psychiatric: She has a normal mood and affect.    ED Course  Procedures (including critical care time)   Labs Reviewed  CBC WITH DIFFERENTIAL  COMPREHENSIVE METABOLIC PANEL  TROPONIN I  URINALYSIS, ROUTINE W REFLEX MICROSCOPIC  PROTIME-INR   No results found.   No diagnosis found.  Cardiac: 70 sr, normal  Pulse ox 99%ra, normal   Date: 03/08/2012  Rate: 71  Rhythm: normal sinus rhythm  QRS Axis: normal  Intervals: normal  ST/T Wave abnormalities: normal  Conduction Disutrbances:none  Narrative Interpretation:   Old EKG Reviewed: none available NORMAL   MDM    This pleasant elderly female presents after multiple episodes of visual disruption.  On exam the patient is in no distress.  The patient has no discernible neurologic deficiencies.  Given the patient's description of bilateral visual changes or some suspicion for a mass lesion versus TIA.  Given the patient's history of a family member with brain tumor, the patient had MRI evaluation following initial CT which was negative.  MRI was similarly reassuring with no acute findings, but with age-appropriate changes.  The patient's labs were unremarkable beyond borderline renal function.  The patient was advised of all results, discharged in stable condition to follow up with primary care physician.        Carmin Muskrat, MD 03/08/12 [phone]

## 2012-03-18 ENCOUNTER — Ambulatory Visit
Admission: RE | Admit: 2012-03-18 | Discharge: 2012-03-18 | Disposition: A | Discharge status: Home / Self Care | Payer: Medicare HMO | Source: Ambulatory Visit | Attending: Internal Medicine | Admitting: Internal Medicine

## 2012-03-18 DIAGNOSIS — Z1231 Encounter for screening mammogram for malignant neoplasm of breast: Secondary | ICD-10-CM

## 2013-02-14 ENCOUNTER — Other Ambulatory Visit: Payer: Self-pay

## 2013-02-14 DIAGNOSIS — Z1231 Encounter for screening mammogram for malignant neoplasm of breast: Secondary | ICD-10-CM

## 2013-03-31 ENCOUNTER — Ambulatory Visit: Payer: Medicare HMO

## 2013-04-14 ENCOUNTER — Ambulatory Visit
Admission: RE | Admit: 2013-04-14 | Discharge: 2013-04-14 | Disposition: A | Discharge status: Home / Self Care | Payer: Medicare HMO | Source: Ambulatory Visit

## 2013-04-14 DIAGNOSIS — Z1231 Encounter for screening mammogram for malignant neoplasm of breast: Secondary | ICD-10-CM

## 2014-02-03 ENCOUNTER — Encounter (HOSPITAL_COMMUNITY): Payer: Self-pay | Admitting: Emergency Medicine

## 2014-02-03 ENCOUNTER — Emergency Department (HOSPITAL_COMMUNITY): Payer: Medicare HMO

## 2014-02-03 ENCOUNTER — Observation Stay (HOSPITAL_COMMUNITY)
Admission: EM | Admit: 2014-02-03 | Discharge: 2014-02-05 | Disposition: A | Discharge status: Skilled Nursing Facility | Payer: Medicare HMO | Attending: Internal Medicine | Admitting: Internal Medicine

## 2014-02-03 DIAGNOSIS — Z860100 Personal history of colon polyps, unspecified: Secondary | ICD-10-CM | POA: Insufficient documentation

## 2014-02-03 DIAGNOSIS — S0003XA Contusion of scalp, initial encounter: Secondary | ICD-10-CM | POA: Diagnosis not present

## 2014-02-03 DIAGNOSIS — S3282XA Multiple fractures of pelvis without disruption of pelvic ring, initial encounter for closed fracture: Secondary | ICD-10-CM | POA: Insufficient documentation

## 2014-02-03 DIAGNOSIS — Z90710 Acquired absence of both cervix and uterus: Secondary | ICD-10-CM | POA: Insufficient documentation

## 2014-02-03 DIAGNOSIS — Z87891 Personal history of nicotine dependence: Secondary | ICD-10-CM | POA: Insufficient documentation

## 2014-02-03 DIAGNOSIS — W108XXA Fall (on) (from) other stairs and steps, initial encounter: Secondary | ICD-10-CM | POA: Insufficient documentation

## 2014-02-03 DIAGNOSIS — M810 Age-related osteoporosis without current pathological fracture: Secondary | ICD-10-CM | POA: Diagnosis not present

## 2014-02-03 DIAGNOSIS — E119 Type 2 diabetes mellitus without complications: Secondary | ICD-10-CM | POA: Insufficient documentation

## 2014-02-03 DIAGNOSIS — Y92009 Unspecified place in unspecified non-institutional (private) residence as the place of occurrence of the external cause: Secondary | ICD-10-CM | POA: Diagnosis not present

## 2014-02-03 DIAGNOSIS — S0083XA Contusion of other part of head, initial encounter: Secondary | ICD-10-CM | POA: Insufficient documentation

## 2014-02-03 DIAGNOSIS — S0100XA Unspecified open wound of scalp, initial encounter: Secondary | ICD-10-CM | POA: Diagnosis not present

## 2014-02-03 DIAGNOSIS — Y9301 Activity, walking, marching and hiking: Secondary | ICD-10-CM | POA: Insufficient documentation

## 2014-02-03 DIAGNOSIS — E785 Hyperlipidemia, unspecified: Secondary | ICD-10-CM | POA: Diagnosis not present

## 2014-02-03 DIAGNOSIS — G319 Degenerative disease of nervous system, unspecified: Secondary | ICD-10-CM | POA: Insufficient documentation

## 2014-02-03 DIAGNOSIS — S32592A Other specified fracture of left pubis, initial encounter for closed fracture: Secondary | ICD-10-CM

## 2014-02-03 DIAGNOSIS — IMO0002 Reserved for concepts with insufficient information to code with codable children: Secondary | ICD-10-CM

## 2014-02-03 DIAGNOSIS — S1093XA Contusion of unspecified part of neck, initial encounter: Secondary | ICD-10-CM

## 2014-02-03 DIAGNOSIS — Y998 Other external cause status: Secondary | ICD-10-CM | POA: Insufficient documentation

## 2014-02-03 DIAGNOSIS — I10 Essential (primary) hypertension: Secondary | ICD-10-CM | POA: Diagnosis not present

## 2014-02-03 DIAGNOSIS — S329XXA Fracture of unspecified parts of lumbosacral spine and pelvis, initial encounter for closed fracture: Secondary | ICD-10-CM | POA: Diagnosis present

## 2014-02-03 LAB — GLUCOSE, CAPILLARY: Glucose-Capillary: 188 mg/dL — ABNORMAL HIGH (ref 70–99)

## 2014-02-03 MED ORDER — OXYCODONE HCL 5 MG PO TABS
5.0000 mg | ORAL_TABLET | ORAL | Status: DC | PRN
  Administered 2014-02-03 – 2014-02-05 (×8): 5 mg via ORAL
  Filled 2014-02-03 (×8): qty 1

## 2014-02-03 MED ORDER — ONDANSETRON HCL 4 MG PO TABS: 4.0000 mg | ORAL_TABLET | Freq: Four times a day (QID) | ORAL | Status: DC | PRN

## 2014-02-03 MED ORDER — ACETAMINOPHEN 325 MG PO TABS
650.0000 mg | ORAL_TABLET | Freq: Four times a day (QID) | ORAL | Status: DC | PRN
Start: 2014-02-03 — End: 2014-02-05
  Administered 2014-02-03 – 2014-02-05 (×6): 650 mg via ORAL
  Filled 2014-02-03 (×6): qty 2

## 2014-02-03 MED ORDER — POLYETHYLENE GLYCOL 3350 17 G PO PACK
17.0000 g | PACK | Freq: Every day | ORAL | Status: DC | PRN
  Administered 2014-02-04: 17 g via ORAL
  Filled 2014-02-03: qty 1

## 2014-02-03 MED ORDER — METFORMIN HCL 500 MG PO TABS
1000.0000 mg | ORAL_TABLET | Freq: Two times a day (BID) | ORAL | Status: DC
Start: 2014-02-04 — End: 2014-02-05
  Administered 2014-02-04 – 2014-02-05 (×3): 1000 mg via ORAL
  Filled 2014-02-03 (×5): qty 2

## 2014-02-03 MED ORDER — ATENOLOL 50 MG PO TABS
50.0000 mg | ORAL_TABLET | Freq: Every day | ORAL | Status: DC
  Administered 2014-02-04 – 2014-02-05 (×2): 50 mg via ORAL
  Filled 2014-02-03 (×2): qty 1

## 2014-02-03 MED ORDER — VITAMIN D3 25 MCG (1000 UNIT) PO TABS
1000.0000 [IU] | ORAL_TABLET | Freq: Every day | ORAL | Status: DC
  Administered 2014-02-04 – 2014-02-05 (×2): 1000 [IU] via ORAL
  Filled 2014-02-03 (×2): qty 1

## 2014-02-03 MED ORDER — ONDANSETRON HCL 4 MG/2ML IJ SOLN: 4.0000 mg | Freq: Four times a day (QID) | INTRAMUSCULAR | Status: DC | PRN

## 2014-02-03 MED ORDER — SODIUM CHLORIDE 0.9 % IV SOLN: 500.0000 mL | INTRAVENOUS | Status: DC

## 2014-02-03 MED ORDER — ADULT MULTIVITAMIN W/MINERALS CH
1.0000 | ORAL_TABLET | Freq: Every day | ORAL | Status: DC
  Administered 2014-02-04 – 2014-02-05 (×2): 1 via ORAL
  Filled 2014-02-03 (×2): qty 1

## 2014-02-03 MED ORDER — ATORVASTATIN CALCIUM 20 MG PO TABS
20.0000 mg | ORAL_TABLET | Freq: Every day | ORAL | Status: DC
  Administered 2014-02-04 – 2014-02-05 (×2): 20 mg via ORAL
  Filled 2014-02-03 (×2): qty 1

## 2014-02-03 MED ORDER — SODIUM CHLORIDE 0.9 % IV SOLN: INTRAVENOUS | Status: DC

## 2014-02-03 MED ORDER — DOCUSATE SODIUM 100 MG PO CAPS
100.0000 mg | ORAL_CAPSULE | Freq: Two times a day (BID) | ORAL | Status: DC
  Administered 2014-02-04 – 2014-02-05 (×3): 100 mg via ORAL
  Filled 2014-02-03 (×4): qty 1

## 2014-02-03 MED ORDER — LISINOPRIL 20 MG PO TABS
20.0000 mg | ORAL_TABLET | Freq: Every day | ORAL | Status: DC
  Administered 2014-02-04 – 2014-02-05 (×2): 20 mg via ORAL
  Filled 2014-02-03 (×2): qty 1

## 2014-02-03 MED ORDER — ACETAMINOPHEN 650 MG RE SUPP: 650.0000 mg | Freq: Four times a day (QID) | RECTAL | Status: DC | PRN

## 2014-02-03 MED ORDER — OXYCODONE-ACETAMINOPHEN 5-325 MG PO TABS
1.0000 | ORAL_TABLET | Freq: Once | ORAL | Status: AC
  Administered 2014-02-03: 1 via ORAL
  Filled 2014-02-03: qty 1

## 2014-02-03 MED ORDER — MORPHINE SULFATE 2 MG/ML IJ SOLN: 2.0000 mg | INTRAMUSCULAR | Status: DC | PRN

## 2014-02-03 MED ORDER — CALCIUM CARBONATE-VITAMIN D 500-200 MG-UNIT PO TABS
ORAL_TABLET | Freq: Every day | ORAL | Status: DC
  Administered 2014-02-04 – 2014-02-05 (×2): 1 via ORAL
  Filled 2014-02-03 (×2): qty 1

## 2014-02-03 MED ORDER — CENTRUM SILVER PO TABS: ORAL_TABLET | Freq: Every day | ORAL | Status: DC

## 2014-02-03 MED ORDER — ENOXAPARIN SODIUM 40 MG/0.4ML ~~LOC~~ SOLN
40.0000 mg | SUBCUTANEOUS | Status: DC
  Administered 2014-02-04 – 2014-02-05 (×2): 40 mg via SUBCUTANEOUS
  Filled 2014-02-03 (×2): qty 0.4

## 2014-02-03 NOTE — H&P (Signed)
Abigail Huang is an 80 y.o. female.   Chief Complaint: fall HPI: Abigail Huang is a pleasant, independent lady.  She was at her daughters house when she fell coming down some stairs. She staggered and fell on the driveway.  She has a scalp laceration which has been stapled.  She has a left pelvic fracture and cannot ambulate.   She was feeling well prior to this.  No fevers,  No LOC. She will be admited.  Past Medical History  Diagnosis Date  . Diabetes mellitus  Since 30 yrs ago   . Hyperlipidemia   . Hypertension   Colon poly removed.  Past Surgical History  Procedure Laterality Date  . Abdominal hysterectomy- but appendix in.  Both ovaries removed 1980.  She has been on HRT ever since.    . Colonoscopy    . Polypectomy      Family History  Problem Relation Age of Onset  . Heart disease Mother   . Heart disease Father   . Heart disease Sister    Social History:  reports that she quit smoking about 46 years ago. She has never used smokeless tobacco. She reports that she does not drink alcohol or use illicit drugs.  Allergies: No Known Allergies  Home meds:  Glucotrol  XL  10 mg ER Metformin 1000 mg twice a day Atenolol 50 mg daily Estrace 1 mg daily lipitor 20 mg daily Niacin 500 mg daily caltrate with  D 600   No results found for this or any previous visit (from the past 48 hour(s)). Dg Hip Complete Left  02/03/2014   CLINICAL DATA:  Pain post trauma  EXAM: LEFT HIP - COMPLETE 2+ VIEW  COMPARISON:  None.  FINDINGS: Frontal pelvis as well as frontal and lateral left hip images were obtained. There is a fracture of the medial left superior pubic ramus, mildly displaced. There is a fracture of the medial left ischium. There is a fracture along the superior most aspect of the right pubic symphysis. No other fractures. No dislocation. There is mild symmetric narrowing of both hip joints.  IMPRESSION: Complex fractures of the medial left superior pubic ramus and ischium. Fracture  along the superior most aspect of the right pubic symphysis. Alignment in these areas is overall near anatomic. No other fractures. No dislocation. Mild symmetric narrowing of both hip joints.   Electronically Signed   By: Lowella Grip M.D.   On: 02/03/2014 17:46   Ct Head Wo Contrast  02/03/2014   CLINICAL DATA:  Fall, head trauma  EXAM: CT HEAD WITHOUT CONTRAST  TECHNIQUE: Contiguous axial images were obtained from the base of the skull through the vertex without intravenous contrast.  COMPARISON:  03/08/2012  FINDINGS: There is scalp swelling in left parietal region. Subcutaneous hematoma in the left scalp measures 2.7 cm length by 6 mm thickness.  No skull fracture is noted. Paranasal sinuses and mastoid air cells are unremarkable. Mild cerebral atrophy. Periventricular white matter decreased attenuation probable due to chronic small vessel ischemic changes. No acute cortical infarction. No mass lesion is noted on this unenhanced scan.  IMPRESSION: No acute intracranial abnormality. There is scalp swelling and subcutaneous hematoma in left parietal scalp. Mild cerebral atrophy. Mild periventricular white matter decreased attenuation probable due to chronic small vessel ischemic changes.   Electronically Signed   By: Lahoma Crocker M.D.   On: 02/03/2014 18:01    [id number] well prior to this accident. No dementia  Blood pressure 140/65, pulse 87,  temperature 97.9 F (36.6 C), temperature source Oral, resp. rate 21, SpO2 97.00%.  age appropriate, nad. supine in bed. no pallor or icterus. alert and oriented times 4.  lungs cta anterolaterally, no w/r/r.  heart rrr no m/r/g . abd soft, nt, nd, no mass or hsm . moe ext. x 4. grossly neurologically intact.    Assessment/Plan 80 yo female with fall, scalp laceration and left pelvic fracture.  She needs inpt admit for pain control, PT and possible snf vs. D/c home with family in 1-3 days.   We will follow sugars.   Hold glucotrol in house.  Continue  other home meds. Add dvt prophylaxis.  This is technically an osteoporotic fracture although she has been on long term HRT for surgical menopause.   If pain not resolving well in 4-6 weeks addition of forteo could be considered.  She reports actually having well preserved bone density on testing at the office and this is her first ever clinical fracture.   Either way, defer any additional or change in bone medication to pcp.  Full code status.  Jerlyn Ly, MD 02/03/2014, 8:32 PM

## 2014-02-03 NOTE — ED Notes (Signed)
Pt c/o severe left hip pain when taken off LSB, pain 10 out of 10 on 0 to 10 pain scale; no other c/o pain.  LSB removed and C-collar.  Dr. Canary Brim advised of left hip pain and gave verbal order for xray

## 2014-02-03 NOTE — ED Notes (Signed)
[facility] presents with a 80 yo female from daughter's house that fell from the bottom step while exiting the residence to leave.  The pt was going home and exiting residence when she got to bottom step and slipped and hit her buttocks and hit the back of head causing laceration of approximately 2 to 3 inches.  No LOC.  Pt was heard screaming by family and called for help.  Fall was unwitnessed.  VS stable and pt placed on LSB and transported to this facility.

## 2014-02-03 NOTE — ED Provider Notes (Signed)
CSN: [id number]     Arrival date & time 02/03/14  1447 History   First MD Initiated Contact with Patient 02/03/14 1638     Chief Complaint  Patient presents with  . Fall     (Consider location/radiation/quality/duration/timing/severity/associated sxs/prior Treatment) HPI Comments: Patient presents after sustaining a fall. She was at her daughter's house and was going down some stairs outside her front door. She tripped on one of the steps and fell into her left side. She also hit her head. She denies any loss of consciousness. She had a laceration to the left side of her head. She denies any neck or back pain. She's had some constant throbbing pain to her left hip since the fall. She denies any other injuries. She denies any symptoms preceding the fall such as chest pain dizziness or shortness of breath. She states her tetanus shot is up-to-date.  Patient is a 80 y.o. female presenting with fall.  Fall Associated symptoms include headaches. Pertinent negatives include no chest pain, no abdominal pain and no shortness of breath.    Past Medical History  Diagnosis Date  . Diabetes mellitus   . Hyperlipidemia   . Hypertension    Past Surgical History  Procedure Laterality Date  . Abdominal hysterectomy    . Colonoscopy    . Polypectomy     Family History  Problem Relation Age of Onset  . Heart disease Mother   . Heart disease Father   . Heart disease Sister    History  Substance Use Topics  . Smoking status: Former Smoker    Quit date: 06/27/1967  . Smokeless tobacco: Never Used  . Alcohol Use: No   OB History   Grav Para Term Preterm Abortions TAB SAB Ect Mult Living                 Review of Systems  Constitutional: Negative for fever, chills, diaphoresis and fatigue.  HENT: Negative for congestion, rhinorrhea and sneezing.   Eyes: Negative.   Respiratory: Negative for cough, chest tightness and shortness of breath.   Cardiovascular: Negative for chest pain and leg  swelling.  Gastrointestinal: Negative for nausea, vomiting, abdominal pain, diarrhea and blood in stool.  Genitourinary: Negative for frequency, hematuria, flank pain and difficulty urinating.  Musculoskeletal: Positive for arthralgias. Negative for back pain.  Skin: Positive for wound. Negative for rash.  Neurological: Positive for headaches. Negative for speech difficulty, weakness and numbness.      Allergies  Review of patient's allergies indicates no known allergies.  Home Medications   Prior to Admission medications   Medication Sig Start Date End Date Taking? Authorizing Provider  atenolol (TENORMIN) 50 MG tablet Take 50 mg by mouth daily.    Yes Historical Provider, MD  atorvastatin (LIPITOR) 20 MG tablet Take 20 mg by mouth daily.   Yes Historical Provider, MD  Calcium Carbonate-Vitamin D (CALTRATE 600+D PO) Take 1 tablet by mouth daily.    Yes Historical Provider, MD  cholecalciferol (VITAMIN D) 1000 UNITS tablet Take 1,000 Units by mouth daily.   Yes Historical Provider, MD  estradiol (ESTRACE) 1 MG tablet Take 1 mg by mouth once a week.    Yes Historical Provider, MD  glipiZIDE (GLUCOTROL) 10 MG 24 hr tablet Take 10 mg by mouth 2 (two) times daily.    Yes Historical Provider, MD  lisinopril (PRINIVIL,ZESTRIL) 20 MG tablet Take 20 mg by mouth daily.    Yes Historical Provider, MD  metFORMIN (GLUCOPHAGE) 1000 MG tablet  Take 1,000 mg by mouth daily with breakfast.    Yes Historical Provider, MD  Multiple Vitamins-Minerals (CENTRUM SILVER PO) Take 1 tablet by mouth daily.   Yes Historical Provider, MD   BP 140/65  Pulse 87  Temp(Src) 97.9 F (36.6 C) (Oral)  Resp 21  SpO2 97% Physical Exam  Constitutional: She is oriented to person, place, and time. She appears well-developed and well-nourished.  HENT:  Head: Normocephalic.  2 cm laceration to the left parietal area. there is no active bleeding.  Eyes: Pupils are equal, round, and reactive to light.  Neck:  No pain  along the cervical thoracic or lumbosacral spine  Cardiovascular: Normal rate, regular rhythm and normal heart sounds.   Pulmonary/Chest: Effort normal and breath sounds normal. No respiratory distress. She has no wheezes. She has no rales. She exhibits no tenderness.  Abdominal: Soft. Bowel sounds are normal. There is no tenderness. There is no rebound and no guarding.  Musculoskeletal: Normal range of motion. She exhibits no edema.  Positive tenderness on range of motion of left hip. There is no pain to the knee or ankle. She has positive pedal pulses bilaterally.  She has abrasions to her hands, her left elbow, and her left ankle. There is no underlying bony tenderness.  Lymphadenopathy:    She has no cervical adenopathy.  Neurological: She is alert and oriented to person, place, and time.  Skin: Skin is warm and dry. No rash noted.  Psychiatric: She has a normal mood and affect.    ED Course  LACERATION REPAIR Date/Time: 02/03/2014 8:04 PM Performed by: Malvin Johns Authorized by: Malvin Johns Consent: Verbal consent obtained. Risks and benefits: risks, benefits and alternatives were discussed Consent given by: patient Body area: head/neck Location details: scalp Laceration length: 2 cm Foreign bodies: no foreign bodies Tendon involvement: none Nerve involvement: none Vascular damage: no Anesthesia: local infiltration Local anesthetic: lidocaine 1% with epinephrine Anesthetic total: 4 ml Preparation: Patient was prepped and draped in the usual sterile fashion. Irrigation solution: saline Irrigation method: syringe Amount of cleaning: standard Skin closure: staples Number of sutures: 3 Technique: simple Approximation: close Approximation difficulty: simple   (including critical care time) Labs Review Labs Reviewed - No data to display  Imaging Review Dg Hip Complete Left  02/03/2014   CLINICAL DATA:  Pain post trauma  EXAM: LEFT HIP - COMPLETE 2+ VIEW  COMPARISON:   None.  FINDINGS: Frontal pelvis as well as frontal and lateral left hip images were obtained. There is a fracture of the medial left superior pubic ramus, mildly displaced. There is a fracture of the medial left ischium. There is a fracture along the superior most aspect of the right pubic symphysis. No other fractures. No dislocation. There is mild symmetric narrowing of both hip joints.  IMPRESSION: Complex fractures of the medial left superior pubic ramus and ischium. Fracture along the superior most aspect of the right pubic symphysis. Alignment in these areas is overall near anatomic. No other fractures. No dislocation. Mild symmetric narrowing of both hip joints.   Electronically Signed   By: Lowella Grip M.D.   On: 02/03/2014 17:46   Ct Head Wo Contrast  02/03/2014   CLINICAL DATA:  Fall, head trauma  EXAM: CT HEAD WITHOUT CONTRAST  TECHNIQUE: Contiguous axial images were obtained from the base of the skull through the vertex without intravenous contrast.  COMPARISON:  03/08/2012  FINDINGS: There is scalp swelling in left parietal region. Subcutaneous hematoma in the left scalp  measures 2.7 cm length by 6 mm thickness.  No skull fracture is noted. Paranasal sinuses and mastoid air cells are unremarkable. Mild cerebral atrophy. Periventricular white matter decreased attenuation probable due to chronic small vessel ischemic changes. No acute cortical infarction. No mass lesion is noted on this unenhanced scan.  IMPRESSION: No acute intracranial abnormality. There is scalp swelling and subcutaneous hematoma in left parietal scalp. Mild cerebral atrophy. Mild periventricular white matter decreased attenuation probable due to chronic small vessel ischemic changes.   Electronically Signed   By: Lahoma Crocker M.D.   On: 02/03/2014 18:01     EKG Interpretation   Date/Time:  Saturday February 03 2014 14:47:53 EDT Ventricular Rate:  85 PR Interval:  187 QRS Duration: 80 QT Interval:  350 QTC Calculation:  416 R Axis:   12 Text Interpretation:  Sinus rhythm since last tracing no significant  change Confirmed by Earlena Werst  MD, Alia Parsley ([id number]) on 02/03/2014 6:12:21 PM      MDM   Final diagnoses:  Laceration  Multiple closed stable fractures of pubic ramus, left, initial encounter    Patient presents after a fall. She has no evidence of intracranial hemorrhage. She has no spinal tenderness. Her laceration was repaired by me in the ED. She has pubic rami fractures. We attempted to ambulate her but she's unable to bear weight. I consulted with Dr. Joylene Draft who is on call for [facility] and he is going to admit the patient for pain control and physical therapy.    Malvin Johns, MD 02/03/14 2006

## 2014-02-04 ENCOUNTER — Inpatient Hospital Stay (HOSPITAL_COMMUNITY): Payer: Medicare HMO

## 2014-02-04 LAB — CBC WITH DIFFERENTIAL/PLATELET
BASOS ABS: 0 10*3/uL (ref 0.0–0.1)
BASOS PCT: 0 % (ref 0–1)
Eosinophils Absolute: 0.1 10*3/uL (ref 0.0–0.7)
Eosinophils Relative: 1 % (ref 0–5)
HCT: 36.3 % (ref 36.0–46.0)
Hemoglobin: 12.3 g/dL (ref 12.0–15.0)
Lymphocytes Relative: 14 % (ref 12–46)
Lymphs Abs: 1.4 10*3/uL (ref 0.7–4.0)
MCH: 30.2 pg (ref 26.0–34.0)
MCHC: 33.9 g/dL (ref 30.0–36.0)
MCV: 89.2 fL (ref 78.0–100.0)
MONO ABS: 1 10*3/uL (ref 0.1–1.0)
MONOS PCT: 11 % (ref 3–12)
NEUTROS PCT: 74 % (ref 43–77)
Neutro Abs: 7.2 10*3/uL (ref 1.7–7.7)
Platelets: 263 10*3/uL (ref 150–400)
RBC: 4.07 MIL/uL (ref 3.87–5.11)
RDW: 12.2 % (ref 11.5–15.5)
WBC: 9.6 10*3/uL (ref 4.0–10.5)

## 2014-02-04 LAB — COMPREHENSIVE METABOLIC PANEL
ALK PHOS: 44 U/L (ref 39–117)
ALT: 13 U/L (ref 0–35)
AST: 16 U/L (ref 0–37)
Albumin: 3.4 g/dL — ABNORMAL LOW (ref 3.5–5.2)
Anion gap: 14 (ref 5–15)
BILIRUBIN TOTAL: 0.5 mg/dL (ref 0.3–1.2)
BUN: 25 mg/dL — ABNORMAL HIGH (ref 6–23)
CHLORIDE: 96 meq/L (ref 96–112)
CO2: 21 mEq/L (ref 19–32)
Calcium: 9.7 mg/dL (ref 8.4–10.5)
Creatinine, Ser: 1.54 mg/dL — ABNORMAL HIGH (ref 0.50–1.10)
GFR calc non Af Amer: 31 mL/min — ABNORMAL LOW (ref >90)
GFR, EST AFRICAN AMERICAN: 36 mL/min — AB (ref >90)
GLUCOSE: 216 mg/dL — AB (ref 70–99)
POTASSIUM: 4.9 meq/L (ref 3.7–5.3)
SODIUM: 131 meq/L — AB (ref 137–147)
Total Protein: 6.9 g/dL (ref 6.0–8.3)

## 2014-02-04 LAB — GLUCOSE, CAPILLARY
Glucose-Capillary: 157 mg/dL — ABNORMAL HIGH (ref 70–99)
Glucose-Capillary: 294 mg/dL — ABNORMAL HIGH (ref 70–99)

## 2014-02-04 MED ORDER — INSULIN ASPART 100 UNIT/ML ~~LOC~~ SOLN
0.0000 [IU] | Freq: Every day | SUBCUTANEOUS | Status: DC
  Administered 2014-02-04: 3 [IU] via SUBCUTANEOUS

## 2014-02-04 MED ORDER — INSULIN ASPART 100 UNIT/ML ~~LOC~~ SOLN
0.0000 [IU] | Freq: Three times a day (TID) | SUBCUTANEOUS | Status: DC
  Administered 2014-02-05: 3 [IU] via SUBCUTANEOUS
  Administered 2014-02-05: 8 [IU] via SUBCUTANEOUS

## 2014-02-04 NOTE — Progress Notes (Signed)
PT Cancellation Note  Patient Details Name: Abigail Huang MRN: [id number] DOB: 11/09/1930   Cancelled Treatment:    Reason Eval/Treat Not Completed: Patient declined, no reason specified  Patient politely declines PT services at this time. States she has just received company and requests PT return at a later time for evaluation. Will follow up as time allows.  [place], [place]  [name] 02/04/2014, 2:39 PM

## 2014-02-04 NOTE — Progress Notes (Signed)
HS CBG was 294. Dr. Virgina Jock notified. Ordered Sliding scale coverage.

## 2014-02-04 NOTE — Progress Notes (Addendum)
Subjective: Pain reasonably controlled with the oxycodone. She does have sig. Scapula pain on the left and the nurse astutely asked for plain film of left scapula which is pending now.  Objective: Vital signs in last 24 hours: Temp:  [97.9 F (36.6 C)-99 F (37.2 C)] 99 F (37.2 C) (08/23 0516) Pulse Rate:  [80-89] 80 (08/23 0516) Resp:  [16-23] 16 (08/23 0516) BP: (122-175)/(59-108) 175/88 mmHg (08/23 0516) SpO2:  [95 %-100 %] 100 % (08/23 0516) Weight:  [68.493 kg (151 lb)] 68.493 kg (151 lb) (08/23 0516) Weight change:  Last BM Date: 02/03/14  Intake/Output from previous day: 08/22 0701 - 08/23 0700 In: 320 [P.O.:320] Out: 400 [Urine:400] Intake/Output this shift: Total I/O In: 240 [P.O.:240] Out: -   General appearance: alert, cooperative and appears stated age Resp: clear to auscultation bilaterally Cardio: regular rate and rhythm, S1, S2 normal, no murmur, click, rub or gallop GI: soft, non-tender; bowel sounds normal; no masses,  no organomegaly Extremities: extremities normal, atraumatic, no cyanosis or edema Neurologic: Grossly normal   Lab Results: No results found for this basename: WBC, HGB, HCT, PLT,  in the last 72 hours BMET No results found for this basename: NA, K, CL, CO2, GLUCOSE, BUN, CREATININE, CALCIUM,  in the last 72 hours CMET CMP     Component Value Date/Time   NA 135 03/08/2012 1553   K 4.8 03/08/2012 1553   CL 102 03/08/2012 1553   CO2 21 03/08/2012 1553   GLUCOSE 132* 03/08/2012 1553   BUN 26* 03/08/2012 1553   CREATININE 1.58* 03/08/2012 1553   CALCIUM 10.6* 03/08/2012 1553   PROT 7.4 03/08/2012 1553   ALBUMIN 3.9 03/08/2012 1553   AST 14 03/08/2012 1553   ALT 14 03/08/2012 1553   ALKPHOS 40 03/08/2012 1553   BILITOT 0.3 03/08/2012 1553   GFRNONAA 30* 03/08/2012 1553   GFRAA 35* 03/08/2012 1553     Studies/Results: Dg Hip Complete Left  02/03/2014   CLINICAL DATA:  Pain post trauma  EXAM: LEFT HIP - COMPLETE 2+ VIEW  COMPARISON:  None.   FINDINGS: Frontal pelvis as well as frontal and lateral left hip images were obtained. There is a fracture of the medial left superior pubic ramus, mildly displaced. There is a fracture of the medial left ischium. There is a fracture along the superior most aspect of the right pubic symphysis. No other fractures. No dislocation. There is mild symmetric narrowing of both hip joints.  IMPRESSION: Complex fractures of the medial left superior pubic ramus and ischium. Fracture along the superior most aspect of the right pubic symphysis. Alignment in these areas is overall near anatomic. No other fractures. No dislocation. Mild symmetric narrowing of both hip joints.   Electronically Signed   By: Lowella Grip M.D.   On: 02/03/2014 17:46   Ct Head Wo Contrast  02/03/2014   CLINICAL DATA:  Fall, head trauma  EXAM: CT HEAD WITHOUT CONTRAST  TECHNIQUE: Contiguous axial images were obtained from the base of the skull through the vertex without intravenous contrast.  COMPARISON:  03/08/2012  FINDINGS: There is scalp swelling in left parietal region. Subcutaneous hematoma in the left scalp measures 2.7 cm length by 6 mm thickness.  No skull fracture is noted. Paranasal sinuses and mastoid air cells are unremarkable. Mild cerebral atrophy. Periventricular white matter decreased attenuation probable due to chronic small vessel ischemic changes. No acute cortical infarction. No mass lesion is noted on this unenhanced scan.  IMPRESSION: No acute intracranial abnormality. There  is scalp swelling and subcutaneous hematoma in left parietal scalp. Mild cerebral atrophy. Mild periventricular white matter decreased attenuation probable due to chronic small vessel ischemic changes.   Electronically Signed   By: Lahoma Crocker M.D.   On: 02/03/2014 18:01    Medications: I have reviewed the patient's current medications. Marland Kitchen atenolol  50 mg Oral Daily  . atorvastatin  20 mg Oral Daily  . calcium-vitamin D   Oral Daily  .  cholecalciferol  1,000 Units Oral Daily  . docusate sodium  100 mg Oral BID  . enoxaparin (LOVENOX) injection  40 mg Subcutaneous Q24H  . lisinopril  20 mg Oral Daily  . metFORMIN  1,000 mg Oral BID WC  . multivitamin with minerals  1 tablet Oral Daily     Assessment/Plan: Status post fall with pelvic fracture,  Scalp LAC.  xrays pending to rule out scapula fx.   Continue pain meds, PT, CSW. Possibly home vs. SNF in the next day or two depending on her progress. Check a few baseline labs.  She is leaning towards a snf/rehab stay and I feel that would be best if it can be done.   They are considering a facility in [place].    Active Problems:   Pelvic fracture   LOS: 1 day   Louvina Cleary A, MD 02/04/2014, 11:19 AM

## 2014-02-05 LAB — GLUCOSE, CAPILLARY
Glucose-Capillary: 193 mg/dL — ABNORMAL HIGH (ref 70–99)
Glucose-Capillary: 255 mg/dL — ABNORMAL HIGH (ref 70–99)

## 2014-02-05 MED ORDER — POLYETHYLENE GLYCOL 3350 17 G PO PACK: 17.0000 g | PACK | Freq: Every day | ORAL | Status: DC | PRN

## 2014-02-05 MED ORDER — INSULIN ASPART 100 UNIT/ML ~~LOC~~ SOLN: 0.0000 [IU] | Freq: Three times a day (TID) | SUBCUTANEOUS | Status: DC

## 2014-02-05 MED ORDER — ACETAMINOPHEN 325 MG PO TABS: 650.0000 mg | ORAL_TABLET | Freq: Four times a day (QID) | ORAL | Status: DC | PRN

## 2014-02-05 MED ORDER — ENOXAPARIN SODIUM 30 MG/0.3ML ~~LOC~~ SOLN: 30.0000 mg | SUBCUTANEOUS | Status: DC

## 2014-02-05 MED ORDER — OXYCODONE HCL 5 MG PO TABS: 5.0000 mg | ORAL_TABLET | ORAL | Status: DC | PRN

## 2014-02-05 NOTE — Clinical Social Work Psychosocial (Signed)
Clinical Social Work Department BRIEF PSYCHOSOCIAL ASSESSMENT 02/05/2014  Patient:  Abigail Huang, Abigail Huang     Account Number:  [id number]     Admit date:  02/03/2014  Clinical Social Worker:  Wylene Men  Date/Time:  02/05/2014 02:27 PM  Referred by:  Physician  Date Referred:  02/05/2014 Referred for  SNF Placement   Other Referral:   none   Interview type:  Other - See comment Other interview type:   CSW met with pt, daughter and husband at bedside    PSYCHOSOCIAL DATA Living Status:  HUSBAND Admitted from facility:   Level of care:   Primary support name:  Ronnie Primary support relationship to patient:  SPOUSE Degree of support available:   strong    CURRENT CONCERNS Current Concerns  Post-Acute Placement   Other Concerns:   none    SOCIAL WORK ASSESSMENT / PLAN CSW assessed pt at bedside. Pt was accompanied by husband and daughter.  PT has recommended STR/SNF once medically dc'd.  Pt and family aware and agreeable.  Pt has [employer] which will require authorization to an in network facility.  Pt and family aware.  Family chooses [facility] which is out of network with insurance. Family aware and agreeable to pay 20%.  Transportation requested from [employer].    Family is saddened by the pt needing to go to SNF and not home, but is grateful that pt is able to receive STR in their choice facility.   Assessment/plan status:  Psychosocial Support/Ongoing Assessment of Needs Other assessment/ plan:   FL2  PASARR   Information/referral to community resources:   SNF    PATIENT'S/FAMILY'S RESPONSE TO PLAN OF CARE: Family agreeable to plan of care and dc to SNF.         Nonnie Done, [place] [phone]  Clinical Social Work

## 2014-02-05 NOTE — Clinical Social Work Note (Signed)
Pt to dc to [facility], SNF which is Out of Network with [employer].  Family aware and agreeable to pay 20%.   RN to call report to [phone] Transportation: [facility] Family at bedside and updated by CSW.  Daughter to follow [facility] to SNF to complete paperwork.  Nonnie Done, [place] [phone]  Clinical Social Work

## 2014-02-05 NOTE — Evaluation (Signed)
Physical Therapy Evaluation Patient Details Name: Abigail Huang MRN: [id number] DOB: 1930-11-06 Today's Date: 02/05/2014   History of Present Illness  80 y.o. female admitted with Complex fractures of the medial left superior pubic ramus and ischium. Fracture along the superior most aspect of the right pubic symphysis.  Clinical Impression  Pt admitted with above. Pt currently with functional limitations due to the deficits listed below (see PT Problem List). Able to ambulate up to 8 feet today, limited by pain with weight-bearing through LLE, and arm fatigue from use of rolling walker. Highly motivated to improve and tolerated therapeutic exercises well. Pt will benefit from skilled PT to increase their independence and safety with mobility to allow discharge to the venue listed below.       Follow Up Recommendations SNF;Supervision for mobility/OOB    Equipment Recommendations  Rolling walker with 5" wheels;3in1 (PT)    Recommendations for Other Services       Precautions / Restrictions Precautions Precautions: Fall Restrictions Weight Bearing Restrictions: No      Mobility  Bed Mobility                  Transfers Overall transfer level: Needs assistance Equipment used: Rolling walker (2 wheeled) Transfers: Sit to/from Stand Sit to Stand: Min assist         General transfer comment: Min assist for boost to stand. Tactile cues for hand placement. Requires extra time.  Ambulation/Gait Ambulation/Gait assistance: Min assist Ambulation Distance (Feet): 8 Feet Assistive device: Rolling walker (2 wheeled) Gait Pattern/deviations: Step-to pattern;Decreased step length - right;Decreased step length - left;Decreased stance time - left;Antalgic   Gait velocity interpretation: Below normal speed for age/gender General Gait Details: Poor weight-bearing through LLE. Max cues for use of UEs through walker. Educated on safe DME use with walker. VC for sequencing and min  assist for stability and walker positioning.  Stairs            Wheelchair Mobility    Modified Rankin (Stroke Patients Only)       Balance Overall balance assessment: Needs assistance Sitting-balance support: No upper extremity supported;Feet supported Sitting balance-Leahy Scale: Good     Standing balance support: Bilateral upper extremity supported Standing balance-Leahy Scale: Poor                               Pertinent Vitals/Pain Pain Assessment: 0-10 Pain Score: 10-Worst pain ever Pain Location: "Left hip" Pain Descriptors / Indicators: Sore Pain Intervention(s): Limited activity within patient's tolerance;Monitored during session;Premedicated before session;Repositioned    Home Living Family/patient expects to be discharged to:: Skilled nursing facility ([facility] [place], [place]) Living Arrangements: Spouse/significant other Available Help at Discharge: [place] Type of Home: House Home Access: Stairs to enter Entrance Stairs-Rails: Left Entrance Stairs-Number of Steps: 1 Home Layout: One level Home Equipment: Shower seat      Prior Function Level of Independence: Independent               Hand Dominance   Dominant Hand: Right    Extremity/Trunk Assessment   Upper Extremity Assessment: Defer to OT evaluation           Lower Extremity Assessment: LLE deficits/detail   LLE Deficits / Details: Decreased strength and ROM as expected with this fracture. Limited movement due to pain     Communication   Communication: No difficulties  Cognition Arousal/Alertness: Awake/alert Behavior During Therapy: [facility]  for tasks assessed/performed Overall Cognitive Status: Within Functional Limits for tasks assessed                      General Comments      Exercises General Exercises - Lower Extremity Ankle Circles/Pumps: AROM;Both;5 reps;Seated Gluteal Sets: Strengthening;Both;5  reps;Seated Long Arc Quad: Strengthening;5 reps;Seated;Left Hip Flexion/Marching: Strengthening;Both;5 reps;Seated      Assessment/Plan    PT Assessment Patient needs continued PT services  PT Diagnosis Difficulty walking;Abnormality of gait;Acute pain   PT Problem List Decreased strength;Decreased range of motion;Decreased activity tolerance;Decreased balance;Decreased mobility;Decreased knowledge of use of DME;Pain  PT Treatment Interventions DME instruction;Gait training;Functional mobility training;Therapeutic activities;Therapeutic exercise;Balance training;Neuromuscular re-education;Patient/family education;Modalities   PT Goals (Current goals can be found in the Care Plan section) Acute Rehab PT Goals Patient Stated Goal: Get well PT Goal Formulation: With patient Time For Goal Achievement: 02/12/14 Potential to Achieve Goals: Good    Frequency Min 3X/week   Barriers to discharge        Co-evaluation               End of Session   Activity Tolerance: Patient limited by pain Patient left: in chair;with call bell/phone within reach;with family/visitor present Nurse Communication: Mobility status    Functional Assessment Tool Used: Clinical observation Functional Limitation: Mobility: Walking and moving around Mobility: Walking and Moving Around Current Status [id number]): At least 20 percent but less than 40 percent impaired, limited or restricted Mobility: Walking and Moving Around Goal Status [phone]): At least 1 percent but less than 20 percent impaired, limited or restricted    Time: 0852-0917 PT Time Calculation (min): 25 min   Charges:   PT Evaluation $Initial PT Evaluation Tier I: 1 Procedure PT Treatments $Therapeutic Activity: 8-22 mins   PT G Codes:   Functional Assessment Tool Used: Clinical observation Functional Limitation: Mobility: Walking and moving around   Abigail Huang, [place]  Abigail Huang 02/05/2014, 10:17 AM

## 2014-02-05 NOTE — Clinical Social Work Placement (Signed)
Clinical Social Work Department CLINICAL SOCIAL WORK PLACEMENT NOTE 02/05/2014  Patient:  Abigail Huang, Abigail Huang  Account Number:  [id number] [place] date:  02/03/2014  Clinical Social Worker:  Wylene Men  Date/time:  02/05/2014 02:32 PM  Clinical Social Work is seeking post-discharge placement for this patient at the following level of care:   SKILLED NURSING   (*CSW will update this form in Epic as items are completed)   02/05/2014  Patient/family provided with [facility] Department of Clinical Social Work's list of facilities offering this level of care within the geographic area requested by the patient (or if unable, by the patient's family).  02/05/2014  Patient/family informed of their freedom to choose among providers that offer the needed level of care, that participate in Medicare, Medicaid or managed care program needed by the patient, have an available bed and are willing to accept the patient.  02/05/2014  Patient/family informed of [facility]' ownership interest in [facility], as well as of the fact that they are under no obligation to receive care at this facility.  PASARR submitted to EDS on 02/05/2014 PASARR number received on 02/05/2014  FL2 transmitted to all facilities in geographic area requested by pt/family on  02/05/2014 FL2 transmitted to all facilities within larger geographic area on   Patient informed that his/her managed care company has contracts with or will negotiate with  certain facilities, including the following:     Patient/family informed of bed offers received:  02/05/2014 Patient chooses bed at [place], [facility] Physician recommends and patient chooses bed at    Patient to be transferred to [place], [facility] on  02/05/2014 Patient to be transferred to facility by [facility] Patient and family notified of transfer on 02/05/2014 Name of family member notified:  Edd Arbour  The following physician request were  entered in Epic:   Additional Comments: Nonnie Done, [place] [phone]  Clinical Social Work

## 2014-02-05 NOTE — Discharge Summary (Signed)
DISCHARGE SUMMARY  Abigail Huang  MR#: [id number]  DOB:13-Dec-1930  Date of Admission: 02/03/2014 Date of Discharge: 02/05/2014  Attending Physician:Sindhu Nguyen A  Patient's DW:[id number], MD  Consults:  none  Discharge Diagnoses: Active Problems:   Pelvic fracture   Diabetes mellitus type 2 oral agents   Hyperlipidemia   Essential hypertension  Discharge Medications:   Medication List         acetaminophen 325 MG tablet  Commonly known as:  TYLENOL  Take 2 tablets (650 mg total) by mouth every 6 (six) hours as needed for mild pain (or Fever >/= 101).     atenolol 50 MG tablet  Commonly known as:  TENORMIN  Take 50 mg by mouth daily.     atorvastatin 20 MG tablet  Commonly known as:  LIPITOR  Take 20 mg by mouth daily.     CALTRATE 600+D PO  Take 1 tablet by mouth daily.     CENTRUM SILVER PO  Take 1 tablet by mouth daily.     cholecalciferol 1000 UNITS tablet  Commonly known as:  VITAMIN D  Take 1,000 Units by mouth daily.     estradiol 1 MG tablet  Commonly known as:  ESTRACE  Take 1 mg by mouth once a week.     glipiZIDE 10 MG 24 hr tablet  Commonly known as:  GLUCOTROL XL  Take 10 mg by mouth 2 (two) times daily.     insulin aspart 100 UNIT/ML injection  Commonly known as:  novoLOG  Inject 0-15 Units into the skin 3 (three) times daily with meals.     lisinopril 20 MG tablet  Commonly known as:  PRINIVIL,ZESTRIL  Take 20 mg by mouth daily.     metFORMIN 1000 MG tablet  Commonly known as:  GLUCOPHAGE  Take 1,000 mg by mouth daily with breakfast.     oxyCODONE 5 MG immediate release tablet  Commonly known as:  Oxy IR/ROXICODONE  Take 1 tablet (5 mg total) by mouth every 4 (four) hours as needed for moderate pain.     polyethylene glycol packet  Commonly known as:  MIRALAX / GLYCOLAX  Take 17 g by mouth daily as needed for mild constipation.        Hospital Procedures: Dg Scapula Left  02/04/2014   CLINICAL DATA:  Pain  post trauma  EXAM: LEFT SCAPULA - 2+ VIEWS  COMPARISON:  None.  FINDINGS: Frontal and Y scapular images were obtained. There is osteoarthritic change in the left shoulder. No fracture or dislocation apparent. No erosive change.  IMPRESSION: No fracture or dislocation.  Osteoarthritic change left shoulder.   Electronically Signed   By: Lowella Grip M.D.   On: 02/04/2014 17:00   Dg Hip Complete Left  02/03/2014   CLINICAL DATA:  Pain post trauma  EXAM: LEFT HIP - COMPLETE 2+ VIEW  COMPARISON:  None.  FINDINGS: Frontal pelvis as well as frontal and lateral left hip images were obtained. There is a fracture of the medial left superior pubic ramus, mildly displaced. There is a fracture of the medial left ischium. There is a fracture along the superior most aspect of the right pubic symphysis. No other fractures. No dislocation. There is mild symmetric narrowing of both hip joints.  IMPRESSION: Complex fractures of the medial left superior pubic ramus and ischium. Fracture along the superior most aspect of the right pubic symphysis. Alignment in these areas is overall near anatomic. No other fractures. No dislocation. Mild symmetric narrowing of both  hip joints.   Electronically Signed   By: Lowella Grip M.D.   On: 02/03/2014 17:46   Ct Head Wo Contrast  02/03/2014   CLINICAL DATA:  Fall, head trauma  EXAM: CT HEAD WITHOUT CONTRAST  TECHNIQUE: Contiguous axial images were obtained from the base of the skull through the vertex without intravenous contrast.  COMPARISON:  03/08/2012  FINDINGS: There is scalp swelling in left parietal region. Subcutaneous hematoma in the left scalp measures 2.7 cm length by 6 mm thickness.  No skull fracture is noted. Paranasal sinuses and mastoid air cells are unremarkable. Mild cerebral atrophy. Periventricular white matter decreased attenuation probable due to chronic small vessel ischemic changes. No acute cortical infarction. No mass lesion is noted on this unenhanced  scan.  IMPRESSION: No acute intracranial abnormality. There is scalp swelling and subcutaneous hematoma in left parietal scalp. Mild cerebral atrophy. Mild periventricular white matter decreased attenuation probable due to chronic small vessel ischemic changes.   Electronically Signed   By: Lahoma Crocker M.D.   On: 02/03/2014 18:01    History of Present Illness:  Abigail Huang is a pleasant, independent lady. She was at her daughters house when she fell coming down some stairs. She staggered and fell on the driveway. She has a scalp laceration which has been stapled. She has a left pelvic fracture and cannot ambulate. She was feeling well prior to this. No fevers, No LOC.  She will be admited.  Hospital Course: Patient was admitted to the medical service is after sustaining a fall that was nonsurgical involving her pelvis. She has been evaluated by physical therapy and is partially weight bearing as tolerated. Pain has been controlled on by mouth intake is good. Atelectatic discussion with patient and daughter at the bedside regarding home with home health and skilled nursing the latter being their choice as she's not felt to be terribly safe at home given her limited mobility. Her blood sugars have overall been stable with oral agents and sliding scale. Despite having sustained a scalp laceration she is neurologically been stable no signs of any neurologic issues. She is discharged skilled nursing for recovery after her pelvic fracture  Day of Discharge Exam BP 128/71  Pulse 97  Temp(Src) 98.1 F (36.7 C) (Oral)  Resp 16  Ht 5\' 9"  (1.753 m)  Wt 68.493 kg (151 lb)  BMI 22.29 kg/m2  SpO2 97%  Physical Exam: General appearance: alert, cooperative and no distress Eyes: no scleral icterus Throat: oropharynx moist without erythema Resp: clear to auscultation bilaterally Cardio: regular rate and rhythm Extremities: no clubbing, cyanosis or edema Neurologic exam is normal  Discharge Labs:  Recent  Labs  02/04/14 1136  NA 131*  K 4.9  CL 96  CO2 21  GLUCOSE 216*  BUN 25*  CREATININE 1.54*  CALCIUM 9.7    Recent Labs  02/04/14 1136  AST 16  ALT 13  ALKPHOS 44  BILITOT 0.5  PROT 6.9  ALBUMIN 3.4*    Recent Labs  02/04/14 1136  WBC 9.6  NEUTROABS 7.2  HGB 12.3  HCT 36.3  MCV 89.2  PLT 263   No results found for this basename: CKTOTAL, CKMB, CKMBINDEX, TROPONINI,  in the last 72 hours No results found for this basename: TSH, T4TOTAL, FREET3, T3FREE, THYROIDAB,  in the last 72 hours No results found for this basename: VITAMINB12, FOLATE, FERRITIN, TIBC, IRON, RETICCTPCT,  in the last 72 hours  Discharge instructions:     Discharge Instructions   Diet -  low sodium heart healthy    Complete by:  As directed      Increase activity slowly    Complete by:  As directed            Disposition: Skilled nursing for short-term recovery  Follow-up Appts: Follow-up with Dr. Reynaldo Minium at [facility] in 1 week.  Call for appointment.  Condition on Discharge: Stable  Tests Needing Follow-up: None  Signed: Saquoia Sianez A 02/05/2014, 7:57 AM

## 2014-03-28 ENCOUNTER — Other Ambulatory Visit: Payer: Self-pay

## 2014-03-28 DIAGNOSIS — Z1239 Encounter for other screening for malignant neoplasm of breast: Secondary | ICD-10-CM

## 2014-04-06 ENCOUNTER — Other Ambulatory Visit: Payer: Self-pay

## 2014-04-06 DIAGNOSIS — Z1231 Encounter for screening mammogram for malignant neoplasm of breast: Secondary | ICD-10-CM

## 2014-04-16 ENCOUNTER — Ambulatory Visit
Admission: RE | Admit: 2014-04-16 | Discharge: 2014-04-16 | Disposition: A | Discharge status: Home / Self Care | Payer: Medicare HMO | Source: Ambulatory Visit

## 2014-04-16 DIAGNOSIS — Z1231 Encounter for screening mammogram for malignant neoplasm of breast: Secondary | ICD-10-CM

## 2014-05-20 ENCOUNTER — Encounter (HOSPITAL_BASED_OUTPATIENT_CLINIC_OR_DEPARTMENT_OTHER): Payer: Self-pay | Admitting: *Deleted

## 2014-05-20 ENCOUNTER — Emergency Department (HOSPITAL_BASED_OUTPATIENT_CLINIC_OR_DEPARTMENT_OTHER)
Admission: EM | Admit: 2014-05-20 | Discharge: 2014-05-20 | Disposition: A | Discharge status: Home / Self Care | Payer: Medicare HMO | Attending: Emergency Medicine | Admitting: Emergency Medicine

## 2014-05-20 ENCOUNTER — Emergency Department (HOSPITAL_BASED_OUTPATIENT_CLINIC_OR_DEPARTMENT_OTHER): Payer: Medicare HMO

## 2014-05-20 DIAGNOSIS — E785 Hyperlipidemia, unspecified: Secondary | ICD-10-CM | POA: Insufficient documentation

## 2014-05-20 DIAGNOSIS — W01198A Fall on same level from slipping, tripping and stumbling with subsequent striking against other object, initial encounter: Secondary | ICD-10-CM | POA: Insufficient documentation

## 2014-05-20 DIAGNOSIS — I10 Essential (primary) hypertension: Secondary | ICD-10-CM | POA: Insufficient documentation

## 2014-05-20 DIAGNOSIS — Z794 Long term (current) use of insulin: Secondary | ICD-10-CM | POA: Insufficient documentation

## 2014-05-20 DIAGNOSIS — S0990XA Unspecified injury of head, initial encounter: Secondary | ICD-10-CM | POA: Diagnosis present

## 2014-05-20 DIAGNOSIS — S60413A Abrasion of left middle finger, initial encounter: Secondary | ICD-10-CM | POA: Diagnosis not present

## 2014-05-20 DIAGNOSIS — S60415A Abrasion of left ring finger, initial encounter: Secondary | ICD-10-CM | POA: Insufficient documentation

## 2014-05-20 DIAGNOSIS — Z79899 Other long term (current) drug therapy: Secondary | ICD-10-CM | POA: Insufficient documentation

## 2014-05-20 DIAGNOSIS — Z87891 Personal history of nicotine dependence: Secondary | ICD-10-CM | POA: Diagnosis not present

## 2014-05-20 DIAGNOSIS — S60417A Abrasion of left little finger, initial encounter: Secondary | ICD-10-CM | POA: Diagnosis not present

## 2014-05-20 DIAGNOSIS — Y9389 Activity, other specified: Secondary | ICD-10-CM | POA: Insufficient documentation

## 2014-05-20 DIAGNOSIS — Y998 Other external cause status: Secondary | ICD-10-CM | POA: Insufficient documentation

## 2014-05-20 DIAGNOSIS — E119 Type 2 diabetes mellitus without complications: Secondary | ICD-10-CM | POA: Insufficient documentation

## 2014-05-20 DIAGNOSIS — Y9289 Other specified places as the place of occurrence of the external cause: Secondary | ICD-10-CM | POA: Insufficient documentation

## 2014-05-20 DIAGNOSIS — S0081XA Abrasion of other part of head, initial encounter: Secondary | ICD-10-CM | POA: Diagnosis not present

## 2014-05-20 DIAGNOSIS — S3992XA Unspecified injury of lower back, initial encounter: Secondary | ICD-10-CM | POA: Insufficient documentation

## 2014-05-20 DIAGNOSIS — S6990XA Unspecified injury of unspecified wrist, hand and finger(s), initial encounter: Secondary | ICD-10-CM

## 2014-05-20 NOTE — ED Notes (Addendum)
Pt c/o fall with Head injury and left hand injury x 2 hrs ago no LOC

## 2014-05-20 NOTE — ED Provider Notes (Signed)
CSN: [id number]     Arrival date & time 05/20/14  1357 History   First MD Initiated Contact with Patient 05/20/14 1410     Chief Complaint  Patient presents with  . Head Injury     (Consider location/radiation/quality/duration/timing/severity/associated sxs/prior Treatment) HPI Comments: Patient with history of diabetic neuropathy, high blood pressure, high cholesterol -- presents with complaint of head injury. Patient was walking upstairs at approximately 10 AM and lost her footing. She did not feel lightheaded or lose consciousness. She fell backwards onto her lower back and then struck the back of her head on the ground. She also sustained abrasions to her left hand. Patient was able to get up off the ground. She presents for evaluation. No headache, nausea or vomiting, weakness in arms or legs. No neck pain, hip pain, lower back pain. No treatments PTA. Patient went to a restaurant and ate lunch prior to coming to the ED.   The history is provided by the patient.    Past Medical History  Diagnosis Date  . Diabetes mellitus   . Hyperlipidemia   . Hypertension    Past Surgical History  Procedure Laterality Date  . Abdominal hysterectomy    . Colonoscopy    . Polypectomy     Family History  Problem Relation Age of Onset  . Heart disease Mother   . Heart disease Father   . Heart disease Sister    History  Substance Use Topics  . Smoking status: Former Smoker    Quit date: 06/27/1967  . Smokeless tobacco: Never Used  . Alcohol Use: No   OB History    No data available     Review of Systems  Constitutional: Negative for fatigue.  HENT: Negative for tinnitus.   Eyes: Negative for photophobia, pain and visual disturbance.  Respiratory: Negative for shortness of breath.   Cardiovascular: Negative for chest pain.  Gastrointestinal: Negative for nausea and vomiting.  Musculoskeletal: Negative for back pain, gait problem and neck pain.  Skin: Positive for wound.   Neurological: Negative for dizziness, weakness, light-headedness, numbness and headaches.  Psychiatric/Behavioral: Negative for confusion and decreased concentration.    Allergies  Review of patient's allergies indicates no known allergies.  Home Medications   Prior to Admission medications   Medication Sig Start Date End Date Taking? Authorizing Provider  acetaminophen (TYLENOL) 325 MG tablet Take 2 tablets (650 mg total) by mouth every 6 (six) hours as needed for mild pain (or Fever >/= 101). 02/05/14   Geoffery Lyons, MD  atenolol (TENORMIN) 50 MG tablet Take 50 mg by mouth daily.     Historical Provider, MD  atorvastatin (LIPITOR) 20 MG tablet Take 20 mg by mouth daily.    Historical Provider, MD  Calcium Carbonate-Vitamin D (CALTRATE 600+D PO) Take 1 tablet by mouth daily.     Historical Provider, MD  cholecalciferol (VITAMIN D) 1000 UNITS tablet Take 1,000 Units by mouth daily.    Historical Provider, MD  estradiol (ESTRACE) 1 MG tablet Take 1 mg by mouth once a week.     Historical Provider, MD  glipiZIDE (GLUCOTROL) 10 MG 24 hr tablet Take 10 mg by mouth 2 (two) times daily.     Historical Provider, MD  insulin aspart (NOVOLOG) 100 UNIT/ML injection Inject 0-15 Units into the skin 3 (three) times daily with meals. 02/05/14   Geoffery Lyons, MD  lisinopril (PRINIVIL,ZESTRIL) 20 MG tablet Take 20 mg by mouth daily.     Historical Provider, MD  metFORMIN (GLUCOPHAGE) 1000 MG tablet Take 1,000 mg by mouth daily with breakfast.     Historical Provider, MD  Multiple Vitamins-Minerals (CENTRUM SILVER PO) Take 1 tablet by mouth daily.    Historical Provider, MD  oxyCODONE (OXY IR/ROXICODONE) 5 MG immediate release tablet Take 1 tablet (5 mg total) by mouth every 4 (four) hours as needed for moderate pain. 02/05/14   Geoffery Lyons, MD  polyethylene glycol [facility] / Floria Raveling) packet Take 17 g by mouth daily as needed for mild constipation. 02/05/14   Geoffery Lyons, MD   BP 178/82  mmHg  Pulse 87  Temp(Src) 97.7 F (36.5 C) (Oral)  Resp 16  Ht 5\' 8"  (1.727 m)  Wt 145 lb (65.772 kg)  BMI 22.05 kg/m2  SpO2 100%   Physical Exam  Constitutional: She is oriented to person, place, and time. She appears well-developed and well-nourished.  HENT:  Head: Normocephalic. Head is without raccoon's eyes and without Battle's sign.  Right Ear: Tympanic membrane, external ear and ear canal normal. No hemotympanum.  Left Ear: Tympanic membrane, external ear and ear canal normal. No hemotympanum.  Nose: Nose normal. No nasal septal hematoma.  Mouth/Throat: Uvula is midline, oropharynx is clear and moist and mucous membranes are normal.  Slight abrasion noted L occiput.   Eyes: Conjunctivae, EOM and lids are normal. Pupils are equal, round, and reactive to light. Right eye exhibits no nystagmus. Left eye exhibits no nystagmus.  No visible hyphema noted  Neck: Normal range of motion. Neck supple.  Full ROM neck, no mid-line pain.   Cardiovascular: Normal rate and regular rhythm.   No murmur heard. Pulmonary/Chest: Effort normal and breath sounds normal. No respiratory distress. She has no wheezes. She has no rales.  Abdominal: Soft. There is no tenderness. There is no rebound and no guarding.  Musculoskeletal: She exhibits tenderness. She exhibits no edema.       Cervical back: She exhibits normal range of motion, no tenderness and no bony tenderness.       Thoracic back: She exhibits no tenderness and no bony tenderness.       Lumbar back: She exhibits no tenderness and no bony tenderness.  Patient with abrasions overlying PIP joints, dorsum of L hand, long, ring, and small digits. She has point tenderness between PIP and DIP of ring finger.   Neurological: She is alert and oriented to person, place, and time. She has normal strength and normal reflexes. No cranial nerve deficit or sensory deficit. Coordination normal. GCS eye subscore is 4. GCS verbal subscore is 5. GCS motor  subscore is 6.  Skin: Skin is warm and dry.  Psychiatric: She has a normal mood and affect.  Nursing note and vitals reviewed.   ED Course  Procedures (including critical care time) Labs Review Labs Reviewed - No data to display  Imaging Review Ct Head Wo Contrast  05/20/2014   CLINICAL DATA:  Patient status post fall. Hit the back of the head. No loss of consciousness.  EXAM: CT HEAD WITHOUT CONTRAST  TECHNIQUE: Contiguous axial images were obtained from the base of the skull through the vertex without intravenous contrast.  COMPARISON:  CT brain 02/03/2014  FINDINGS: Soft tissue swelling and hematoma is demonstrated overlying the left posterior parietal calvarium. No underlying depressed skull fracture. The calvarium is intact. Paranasal sinuses are unremarkable. Small left mastoid effusion. Right mastoid air cells are unremarkable.  Ventricles and sulci are appropriate for patient's age. Periventricular and subcortical white matter hypodensity  compatible with chronic small vessel ischemic change. No evidence for acute cortically based infarct, intracranial hemorrhage, mass lesion or mass effect.  IMPRESSION: No acute intracranial process.  Small left mastoid effusion.  Swelling of the soft tissues overlying the left posterior parietal calvarium.  Chronic small vessel ischemic change.   Electronically Signed   By: Lovey Newcomer M.D.   On: 05/20/2014 15:10   Dg Hand Complete Left  05/20/2014   CLINICAL DATA:  Initial evaluation for pain third fourth and fifth finger after falling today  EXAM: LEFT HAND - COMPLETE 3+ VIEW  COMPARISON:  None.  FINDINGS: Diffuse arthritic change.  No fracture or dislocation.  IMPRESSION: No acute abnormality   Electronically Signed   By: Skipper Cliche M.D.   On: 05/20/2014 15:42     EKG Interpretation None      2:48 PM Patient seen and examined. Work-up initiated. Pt discussed with and seen by Dr. Reather Converse.   Vital signs reviewed and are as follows: BP 178/82  mmHg  Pulse 87  Temp(Src) 97.7 F (36.5 C) (Oral)  Resp 16  Ht 5\' 8"  (1.727 m)  Wt 145 lb (65.772 kg)  BMI 22.05 kg/m2  SpO2 100%   Discussed that L ring finger could possibly use a stitch. Patient does not want stitches and will perform good wound care at home.   4:10 PM CT negative. Given point tenderness, decision made to x-ray hand -- also neg.   Pt and husband informed. Counseled on good wound care for abraded areas.   Patient was counseled on head injury precautions and symptoms that should indicate their return to the ED.  These include severe worsening headache, vision changes, confusion, loss of consciousness, trouble walking, nausea & vomiting, or weakness/tingling in extremities.    MDM   Final diagnoses:  Head injury  Hand injury   Head injury: CT negative. Patient without signs and symptoms of closed head injury. No neck pain and I do not suspect cervical spine injury.  Hand injury: Imaging is negative. Patient with abrasions and one area on ring finger which could be closed with suturing, however patient refuses. She has full range of motion in hand. Fingers are neurovascularly intact distal to her injury. She was counseled on wound care and precautions and signs and symptoms to return.    Carlisle Cater, PA-C 05/20/14 1612  Mariea Clonts, MD 05/21/14 [phone]

## 2014-05-20 NOTE — ED Notes (Signed)
D/c home with spouse- no new rx given

## 2014-05-20 NOTE — Discharge Instructions (Signed)
Please read and follow all provided instructions.  Your diagnoses today include:  1. Head injury   2. Hand injury     Tests performed today include:  CT scan of your head that did not show any serious injury.  X-ray of hand that did not show any broken bones  Vital signs. See below for your results today.   Medications prescribed:   None  Take any prescribed medications only as directed.  Home care instructions:  Follow any educational materials contained in this packet.  BE VERY CAREFUL not to take multiple medicines containing Tylenol (also called acetaminophen). Doing so can lead to an overdose which can damage your liver and cause liver failure and possibly death.   Follow-up instructions: Please follow-up with your primary care provider in the next 3 days for further evaluation of your symptoms.   Return instructions:  SEEK IMMEDIATE MEDICAL ATTENTION IF:  There is confusion or drowsiness (although children frequently become drowsy after injury).   You cannot awaken the injured person.   You have more than one episode of vomiting.   You notice dizziness or unsteadiness which is getting worse, or inability to walk.   You have convulsions or unconsciousness.   You experience severe, persistent headaches not relieved by Tylenol.  You cannot use arms or legs normally.   There are changes in pupil sizes. (This is the black center in the colored part of the eye)   There is clear or bloody discharge from the nose or ears.   You have change in speech, vision, swallowing, or understanding.   Localized weakness, numbness, tingling, or change in bowel or bladder control.  You have any other emergent concerns.  Additional Information: You have had a head injury which does not appear to require admission at this time.  Your vital signs today were: BP 178/82 mmHg   Pulse 87   Temp(Src) 97.7 F (36.5 C) (Oral)   Resp 16   Ht 5\' 8"  (1.727 m)   Wt 145 lb (65.772 kg)    BMI 22.05 kg/m2   SpO2 100% If your blood pressure (BP) was elevated above 135/85 this visit, please have this repeated by your doctor within one month. --------------

## 2014-11-13 ENCOUNTER — Encounter: Payer: Self-pay | Admitting: Internal Medicine

## 2015-04-10 DIAGNOSIS — M1712 Unilateral primary osteoarthritis, left knee: Secondary | ICD-10-CM | POA: Diagnosis not present

## 2015-04-10 DIAGNOSIS — M179 Osteoarthritis of knee, unspecified: Secondary | ICD-10-CM | POA: Diagnosis not present

## 2015-04-10 DIAGNOSIS — G8918 Other acute postprocedural pain: Secondary | ICD-10-CM | POA: Diagnosis not present

## 2015-04-11 DIAGNOSIS — M12869 Other specific arthropathies, not elsewhere classified, unspecified knee: Secondary | ICD-10-CM | POA: Diagnosis not present

## 2015-04-18 DIAGNOSIS — M1712 Unilateral primary osteoarthritis, left knee: Secondary | ICD-10-CM | POA: Diagnosis not present

## 2015-04-25 DIAGNOSIS — Z23 Encounter for immunization: Secondary | ICD-10-CM | POA: Diagnosis not present

## 2015-05-02 DIAGNOSIS — M1712 Unilateral primary osteoarthritis, left knee: Secondary | ICD-10-CM | POA: Diagnosis not present

## 2015-05-07 ENCOUNTER — Other Ambulatory Visit: Payer: Self-pay

## 2015-05-07 DIAGNOSIS — Z1231 Encounter for screening mammogram for malignant neoplasm of breast: Secondary | ICD-10-CM

## 2015-05-23 DIAGNOSIS — M1712 Unilateral primary osteoarthritis, left knee: Secondary | ICD-10-CM | POA: Diagnosis not present

## 2015-06-12 ENCOUNTER — Ambulatory Visit
Admission: RE | Admit: 2015-06-12 | Discharge: 2015-06-12 | Disposition: A | Discharge status: Home / Self Care | Payer: Medicare HMO | Source: Ambulatory Visit

## 2015-06-12 DIAGNOSIS — Z1231 Encounter for screening mammogram for malignant neoplasm of breast: Secondary | ICD-10-CM | POA: Diagnosis not present

## 2015-06-27 DIAGNOSIS — Z0000 Encounter for general adult medical examination without abnormal findings: Secondary | ICD-10-CM | POA: Diagnosis not present

## 2015-06-27 DIAGNOSIS — E1129 Type 2 diabetes mellitus with other diabetic kidney complication: Secondary | ICD-10-CM | POA: Diagnosis not present

## 2015-07-04 DIAGNOSIS — M1990 Unspecified osteoarthritis, unspecified site: Secondary | ICD-10-CM | POA: Diagnosis not present

## 2015-07-04 DIAGNOSIS — K219 Gastro-esophageal reflux disease without esophagitis: Secondary | ICD-10-CM | POA: Diagnosis not present

## 2015-07-04 DIAGNOSIS — Z0000 Encounter for general adult medical examination without abnormal findings: Secondary | ICD-10-CM | POA: Diagnosis not present

## 2015-07-04 DIAGNOSIS — Z23 Encounter for immunization: Secondary | ICD-10-CM | POA: Diagnosis not present

## 2015-07-04 DIAGNOSIS — Z1389 Encounter for screening for other disorder: Secondary | ICD-10-CM | POA: Diagnosis not present

## 2015-07-04 DIAGNOSIS — R5383 Other fatigue: Secondary | ICD-10-CM | POA: Diagnosis not present

## 2015-07-04 DIAGNOSIS — E784 Other hyperlipidemia: Secondary | ICD-10-CM | POA: Diagnosis not present

## 2015-07-04 DIAGNOSIS — E1129 Type 2 diabetes mellitus with other diabetic kidney complication: Secondary | ICD-10-CM | POA: Diagnosis not present

## 2015-07-04 DIAGNOSIS — I10 Essential (primary) hypertension: Secondary | ICD-10-CM | POA: Diagnosis not present

## 2015-07-04 DIAGNOSIS — N182 Chronic kidney disease, stage 2 (mild): Secondary | ICD-10-CM | POA: Diagnosis not present

## 2015-08-20 DIAGNOSIS — H2511 Age-related nuclear cataract, right eye: Secondary | ICD-10-CM | POA: Diagnosis not present

## 2015-08-20 DIAGNOSIS — H538 Other visual disturbances: Secondary | ICD-10-CM | POA: Diagnosis not present

## 2015-08-21 DIAGNOSIS — L570 Actinic keratosis: Secondary | ICD-10-CM | POA: Diagnosis not present

## 2015-08-21 DIAGNOSIS — X32XXXD Exposure to sunlight, subsequent encounter: Secondary | ICD-10-CM | POA: Diagnosis not present

## 2015-09-09 DIAGNOSIS — H52209 Unspecified astigmatism, unspecified eye: Secondary | ICD-10-CM | POA: Diagnosis not present

## 2015-09-09 DIAGNOSIS — H5200 Hypermetropia, unspecified eye: Secondary | ICD-10-CM | POA: Diagnosis not present

## 2015-09-09 DIAGNOSIS — H269 Unspecified cataract: Secondary | ICD-10-CM | POA: Diagnosis not present

## 2015-09-09 DIAGNOSIS — H401130 Primary open-angle glaucoma, bilateral, stage unspecified: Secondary | ICD-10-CM | POA: Diagnosis not present

## 2015-09-09 DIAGNOSIS — H538 Other visual disturbances: Secondary | ICD-10-CM | POA: Diagnosis not present

## 2015-09-09 DIAGNOSIS — H2513 Age-related nuclear cataract, bilateral: Secondary | ICD-10-CM | POA: Diagnosis not present

## 2015-09-09 DIAGNOSIS — E1136 Type 2 diabetes mellitus with diabetic cataract: Secondary | ICD-10-CM | POA: Diagnosis not present

## 2015-09-09 DIAGNOSIS — H2511 Age-related nuclear cataract, right eye: Secondary | ICD-10-CM | POA: Diagnosis not present

## 2015-09-09 DIAGNOSIS — H524 Presbyopia: Secondary | ICD-10-CM | POA: Diagnosis not present

## 2015-09-09 DIAGNOSIS — Z7984 Long term (current) use of oral hypoglycemic drugs: Secondary | ICD-10-CM | POA: Diagnosis not present

## 2015-09-09 DIAGNOSIS — I10 Essential (primary) hypertension: Secondary | ICD-10-CM | POA: Diagnosis not present

## 2015-09-09 DIAGNOSIS — Z79899 Other long term (current) drug therapy: Secondary | ICD-10-CM | POA: Diagnosis not present

## 2015-11-20 DIAGNOSIS — E1129 Type 2 diabetes mellitus with other diabetic kidney complication: Secondary | ICD-10-CM | POA: Diagnosis not present

## 2015-11-20 DIAGNOSIS — I10 Essential (primary) hypertension: Secondary | ICD-10-CM | POA: Diagnosis not present

## 2015-11-20 DIAGNOSIS — E784 Other hyperlipidemia: Secondary | ICD-10-CM | POA: Diagnosis not present

## 2015-11-20 DIAGNOSIS — M1990 Unspecified osteoarthritis, unspecified site: Secondary | ICD-10-CM | POA: Diagnosis not present

## 2015-11-20 DIAGNOSIS — K219 Gastro-esophageal reflux disease without esophagitis: Secondary | ICD-10-CM | POA: Diagnosis not present

## 2015-11-20 DIAGNOSIS — Z6823 Body mass index (BMI) 23.0-23.9, adult: Secondary | ICD-10-CM | POA: Diagnosis not present

## 2015-11-20 DIAGNOSIS — N183 Chronic kidney disease, stage 3 (moderate): Secondary | ICD-10-CM | POA: Diagnosis not present

## 2015-12-31 DIAGNOSIS — L570 Actinic keratosis: Secondary | ICD-10-CM | POA: Diagnosis not present

## 2015-12-31 DIAGNOSIS — X32XXXD Exposure to sunlight, subsequent encounter: Secondary | ICD-10-CM | POA: Diagnosis not present

## 2016-01-21 DIAGNOSIS — E1129 Type 2 diabetes mellitus with other diabetic kidney complication: Secondary | ICD-10-CM | POA: Diagnosis not present

## 2016-01-21 DIAGNOSIS — I10 Essential (primary) hypertension: Secondary | ICD-10-CM | POA: Diagnosis not present

## 2016-01-21 DIAGNOSIS — E7800 Pure hypercholesterolemia, unspecified: Secondary | ICD-10-CM | POA: Diagnosis not present

## 2016-01-21 DIAGNOSIS — Z794 Long term (current) use of insulin: Secondary | ICD-10-CM | POA: Diagnosis not present

## 2016-01-21 DIAGNOSIS — Z0000 Encounter for general adult medical examination without abnormal findings: Secondary | ICD-10-CM | POA: Diagnosis not present

## 2016-02-04 DIAGNOSIS — L570 Actinic keratosis: Secondary | ICD-10-CM | POA: Diagnosis not present

## 2016-02-04 DIAGNOSIS — X32XXXD Exposure to sunlight, subsequent encounter: Secondary | ICD-10-CM | POA: Diagnosis not present

## 2016-03-25 DIAGNOSIS — Z23 Encounter for immunization: Secondary | ICD-10-CM | POA: Diagnosis not present

## 2016-03-30 DIAGNOSIS — N183 Chronic kidney disease, stage 3 (moderate): Secondary | ICD-10-CM | POA: Diagnosis not present

## 2016-03-30 DIAGNOSIS — K219 Gastro-esophageal reflux disease without esophagitis: Secondary | ICD-10-CM | POA: Diagnosis not present

## 2016-03-30 DIAGNOSIS — E1129 Type 2 diabetes mellitus with other diabetic kidney complication: Secondary | ICD-10-CM | POA: Diagnosis not present

## 2016-03-30 DIAGNOSIS — E784 Other hyperlipidemia: Secondary | ICD-10-CM | POA: Diagnosis not present

## 2016-03-30 DIAGNOSIS — M1990 Unspecified osteoarthritis, unspecified site: Secondary | ICD-10-CM | POA: Diagnosis not present

## 2016-03-30 DIAGNOSIS — Z6824 Body mass index (BMI) 24.0-24.9, adult: Secondary | ICD-10-CM | POA: Diagnosis not present

## 2016-03-30 DIAGNOSIS — I10 Essential (primary) hypertension: Secondary | ICD-10-CM | POA: Diagnosis not present

## 2016-04-28 DIAGNOSIS — X32XXXD Exposure to sunlight, subsequent encounter: Secondary | ICD-10-CM | POA: Diagnosis not present

## 2016-04-28 DIAGNOSIS — L570 Actinic keratosis: Secondary | ICD-10-CM | POA: Diagnosis not present

## 2016-04-28 DIAGNOSIS — D0461 Carcinoma in situ of skin of right upper limb, including shoulder: Secondary | ICD-10-CM | POA: Diagnosis not present

## 2016-05-04 ENCOUNTER — Other Ambulatory Visit: Payer: Self-pay | Admitting: Internal Medicine

## 2016-05-04 DIAGNOSIS — Z1231 Encounter for screening mammogram for malignant neoplasm of breast: Secondary | ICD-10-CM

## 2016-06-11 ENCOUNTER — Ambulatory Visit: Payer: Medicare HMO

## 2016-06-30 ENCOUNTER — Ambulatory Visit: Payer: Medicare HMO

## 2016-07-08 ENCOUNTER — Ambulatory Visit
Admission: RE | Admit: 2016-07-08 | Discharge: 2016-07-08 | Disposition: A | Discharge status: Home / Self Care | Payer: Medicare HMO | Source: Ambulatory Visit | Attending: Internal Medicine | Admitting: Internal Medicine

## 2016-07-08 DIAGNOSIS — Z1231 Encounter for screening mammogram for malignant neoplasm of breast: Secondary | ICD-10-CM | POA: Diagnosis not present

## 2016-08-06 DIAGNOSIS — R8299 Other abnormal findings in urine: Secondary | ICD-10-CM | POA: Diagnosis not present

## 2016-08-06 DIAGNOSIS — E784 Other hyperlipidemia: Secondary | ICD-10-CM | POA: Diagnosis not present

## 2016-08-06 DIAGNOSIS — Z0000 Encounter for general adult medical examination without abnormal findings: Secondary | ICD-10-CM | POA: Diagnosis not present

## 2016-08-06 DIAGNOSIS — E1129 Type 2 diabetes mellitus with other diabetic kidney complication: Secondary | ICD-10-CM | POA: Diagnosis not present

## 2016-08-06 DIAGNOSIS — I10 Essential (primary) hypertension: Secondary | ICD-10-CM | POA: Diagnosis not present

## 2016-08-07 DIAGNOSIS — E119 Type 2 diabetes mellitus without complications: Secondary | ICD-10-CM | POA: Diagnosis not present

## 2016-08-07 DIAGNOSIS — H524 Presbyopia: Secondary | ICD-10-CM | POA: Diagnosis not present

## 2016-08-07 DIAGNOSIS — H40001 Preglaucoma, unspecified, right eye: Secondary | ICD-10-CM | POA: Diagnosis not present

## 2016-08-07 DIAGNOSIS — H2512 Age-related nuclear cataract, left eye: Secondary | ICD-10-CM | POA: Diagnosis not present

## 2016-08-13 DIAGNOSIS — K219 Gastro-esophageal reflux disease without esophagitis: Secondary | ICD-10-CM | POA: Diagnosis not present

## 2016-08-13 DIAGNOSIS — N183 Chronic kidney disease, stage 3 (moderate): Secondary | ICD-10-CM | POA: Diagnosis not present

## 2016-08-13 DIAGNOSIS — Z1389 Encounter for screening for other disorder: Secondary | ICD-10-CM | POA: Diagnosis not present

## 2016-08-13 DIAGNOSIS — M1990 Unspecified osteoarthritis, unspecified site: Secondary | ICD-10-CM | POA: Diagnosis not present

## 2016-08-13 DIAGNOSIS — Z0000 Encounter for general adult medical examination without abnormal findings: Secondary | ICD-10-CM | POA: Diagnosis not present

## 2016-08-13 DIAGNOSIS — I10 Essential (primary) hypertension: Secondary | ICD-10-CM | POA: Diagnosis not present

## 2016-08-13 DIAGNOSIS — E784 Other hyperlipidemia: Secondary | ICD-10-CM | POA: Diagnosis not present

## 2016-08-13 DIAGNOSIS — Z6824 Body mass index (BMI) 24.0-24.9, adult: Secondary | ICD-10-CM | POA: Diagnosis not present

## 2016-08-13 DIAGNOSIS — E1129 Type 2 diabetes mellitus with other diabetic kidney complication: Secondary | ICD-10-CM | POA: Diagnosis not present

## 2016-08-26 DIAGNOSIS — E7800 Pure hypercholesterolemia, unspecified: Secondary | ICD-10-CM | POA: Diagnosis not present

## 2016-08-26 DIAGNOSIS — Z794 Long term (current) use of insulin: Secondary | ICD-10-CM | POA: Diagnosis not present

## 2016-08-26 DIAGNOSIS — Z79899 Other long term (current) drug therapy: Secondary | ICD-10-CM | POA: Diagnosis not present

## 2016-08-26 DIAGNOSIS — Z0000 Encounter for general adult medical examination without abnormal findings: Secondary | ICD-10-CM | POA: Diagnosis not present

## 2016-08-26 DIAGNOSIS — I10 Essential (primary) hypertension: Secondary | ICD-10-CM | POA: Diagnosis not present

## 2016-08-26 DIAGNOSIS — E1142 Type 2 diabetes mellitus with diabetic polyneuropathy: Secondary | ICD-10-CM | POA: Diagnosis not present

## 2016-08-26 DIAGNOSIS — Z8781 Personal history of (healed) traumatic fracture: Secondary | ICD-10-CM | POA: Diagnosis not present

## 2016-08-26 DIAGNOSIS — K08109 Complete loss of teeth, unspecified cause, unspecified class: Secondary | ICD-10-CM | POA: Diagnosis not present

## 2016-08-26 DIAGNOSIS — Z9181 History of falling: Secondary | ICD-10-CM | POA: Diagnosis not present

## 2016-08-26 DIAGNOSIS — Z6825 Body mass index (BMI) 25.0-25.9, adult: Secondary | ICD-10-CM | POA: Diagnosis not present

## 2016-08-27 DIAGNOSIS — Z1212 Encounter for screening for malignant neoplasm of rectum: Secondary | ICD-10-CM | POA: Diagnosis not present

## 2016-09-22 DIAGNOSIS — C44329 Squamous cell carcinoma of skin of other parts of face: Secondary | ICD-10-CM | POA: Diagnosis not present

## 2016-09-22 DIAGNOSIS — C44622 Squamous cell carcinoma of skin of right upper limb, including shoulder: Secondary | ICD-10-CM | POA: Diagnosis not present

## 2016-09-22 DIAGNOSIS — L570 Actinic keratosis: Secondary | ICD-10-CM | POA: Diagnosis not present

## 2016-09-22 DIAGNOSIS — X32XXXD Exposure to sunlight, subsequent encounter: Secondary | ICD-10-CM | POA: Diagnosis not present

## 2016-10-12 DIAGNOSIS — Z08 Encounter for follow-up examination after completed treatment for malignant neoplasm: Secondary | ICD-10-CM | POA: Diagnosis not present

## 2016-10-12 DIAGNOSIS — Z85828 Personal history of other malignant neoplasm of skin: Secondary | ICD-10-CM | POA: Diagnosis not present

## 2016-11-03 ENCOUNTER — Emergency Department (HOSPITAL_COMMUNITY): Payer: Medicare HMO

## 2016-11-03 ENCOUNTER — Encounter (HOSPITAL_COMMUNITY): Payer: Self-pay | Admitting: Emergency Medicine

## 2016-11-03 ENCOUNTER — Inpatient Hospital Stay (HOSPITAL_COMMUNITY)
Admission: EM | Admit: 2016-11-03 | Discharge: 2016-11-08 | DRG: 470 | Disposition: A | Discharge status: Skilled Nursing Facility | Payer: Medicare HMO | Attending: Internal Medicine | Admitting: Internal Medicine

## 2016-11-03 DIAGNOSIS — Z419 Encounter for procedure for purposes other than remedying health state, unspecified: Secondary | ICD-10-CM

## 2016-11-03 DIAGNOSIS — S72131A Displaced apophyseal fracture of right femur, initial encounter for closed fracture: Secondary | ICD-10-CM | POA: Diagnosis not present

## 2016-11-03 DIAGNOSIS — D500 Iron deficiency anemia secondary to blood loss (chronic): Secondary | ICD-10-CM | POA: Diagnosis present

## 2016-11-03 DIAGNOSIS — D649 Anemia, unspecified: Secondary | ICD-10-CM | POA: Diagnosis not present

## 2016-11-03 DIAGNOSIS — Z79899 Other long term (current) drug therapy: Secondary | ICD-10-CM | POA: Diagnosis not present

## 2016-11-03 DIAGNOSIS — W010XXA Fall on same level from slipping, tripping and stumbling without subsequent striking against object, initial encounter: Secondary | ICD-10-CM | POA: Diagnosis not present

## 2016-11-03 DIAGNOSIS — E1122 Type 2 diabetes mellitus with diabetic chronic kidney disease: Secondary | ICD-10-CM | POA: Diagnosis present

## 2016-11-03 DIAGNOSIS — R404 Transient alteration of awareness: Secondary | ICD-10-CM | POA: Diagnosis not present

## 2016-11-03 DIAGNOSIS — S72001A Fracture of unspecified part of neck of right femur, initial encounter for closed fracture: Secondary | ICD-10-CM | POA: Diagnosis not present

## 2016-11-03 DIAGNOSIS — Z87891 Personal history of nicotine dependence: Secondary | ICD-10-CM

## 2016-11-03 DIAGNOSIS — E871 Hypo-osmolality and hyponatremia: Secondary | ICD-10-CM | POA: Diagnosis not present

## 2016-11-03 DIAGNOSIS — E785 Hyperlipidemia, unspecified: Secondary | ICD-10-CM | POA: Diagnosis not present

## 2016-11-03 DIAGNOSIS — N184 Chronic kidney disease, stage 4 (severe): Secondary | ICD-10-CM | POA: Diagnosis not present

## 2016-11-03 DIAGNOSIS — J9811 Atelectasis: Secondary | ICD-10-CM | POA: Diagnosis not present

## 2016-11-03 DIAGNOSIS — I10 Essential (primary) hypertension: Secondary | ICD-10-CM | POA: Diagnosis not present

## 2016-11-03 DIAGNOSIS — M810 Age-related osteoporosis without current pathological fracture: Secondary | ICD-10-CM | POA: Diagnosis present

## 2016-11-03 DIAGNOSIS — S72091A Other fracture of head and neck of right femur, initial encounter for closed fracture: Secondary | ICD-10-CM | POA: Diagnosis not present

## 2016-11-03 DIAGNOSIS — M25559 Pain in unspecified hip: Secondary | ICD-10-CM | POA: Diagnosis not present

## 2016-11-03 DIAGNOSIS — I129 Hypertensive chronic kidney disease with stage 1 through stage 4 chronic kidney disease, or unspecified chronic kidney disease: Secondary | ICD-10-CM | POA: Diagnosis not present

## 2016-11-03 DIAGNOSIS — R2689 Other abnormalities of gait and mobility: Secondary | ICD-10-CM | POA: Diagnosis not present

## 2016-11-03 DIAGNOSIS — E119 Type 2 diabetes mellitus without complications: Secondary | ICD-10-CM

## 2016-11-03 DIAGNOSIS — E784 Other hyperlipidemia: Secondary | ICD-10-CM | POA: Diagnosis not present

## 2016-11-03 DIAGNOSIS — D72829 Elevated white blood cell count, unspecified: Secondary | ICD-10-CM | POA: Diagnosis present

## 2016-11-03 DIAGNOSIS — R278 Other lack of coordination: Secondary | ICD-10-CM | POA: Diagnosis not present

## 2016-11-03 DIAGNOSIS — Z471 Aftercare following joint replacement surgery: Secondary | ICD-10-CM | POA: Diagnosis not present

## 2016-11-03 DIAGNOSIS — D631 Anemia in chronic kidney disease: Secondary | ICD-10-CM | POA: Diagnosis present

## 2016-11-03 DIAGNOSIS — Z4789 Encounter for other orthopedic aftercare: Secondary | ICD-10-CM | POA: Diagnosis not present

## 2016-11-03 DIAGNOSIS — S72031A Displaced midcervical fracture of right femur, initial encounter for closed fracture: Secondary | ICD-10-CM | POA: Diagnosis not present

## 2016-11-03 DIAGNOSIS — Z794 Long term (current) use of insulin: Secondary | ICD-10-CM | POA: Diagnosis not present

## 2016-11-03 DIAGNOSIS — Z96641 Presence of right artificial hip joint: Secondary | ICD-10-CM | POA: Diagnosis not present

## 2016-11-03 DIAGNOSIS — R531 Weakness: Secondary | ICD-10-CM | POA: Diagnosis not present

## 2016-11-03 DIAGNOSIS — Z7984 Long term (current) use of oral hypoglycemic drugs: Secondary | ICD-10-CM | POA: Diagnosis not present

## 2016-11-03 DIAGNOSIS — N185 Chronic kidney disease, stage 5: Secondary | ICD-10-CM | POA: Diagnosis present

## 2016-11-03 LAB — BASIC METABOLIC PANEL
Anion gap: 9 (ref 5–15)
BUN: 39 mg/dL — ABNORMAL HIGH (ref 6–20)
CO2: 18 mmol/L — ABNORMAL LOW (ref 22–32)
Calcium: 9.7 mg/dL (ref 8.9–10.3)
Chloride: 103 mmol/L (ref 101–111)
Creatinine, Ser: 2.02 mg/dL — ABNORMAL HIGH (ref 0.44–1.00)
GFR calc Af Amer: 25 mL/min — ABNORMAL LOW (ref >60)
GFR calc non Af Amer: 22 mL/min — ABNORMAL LOW (ref >60)
Glucose, Bld: 168 mg/dL — ABNORMAL HIGH (ref 65–99)
POTASSIUM: 4.8 mmol/L (ref 3.5–5.1)
SODIUM: 130 mmol/L — AB (ref 135–145)

## 2016-11-03 LAB — URINALYSIS, ROUTINE W REFLEX MICROSCOPIC
Bacteria, UA: NONE SEEN
Bilirubin Urine: NEGATIVE
Glucose, UA: NEGATIVE mg/dL
Ketones, ur: NEGATIVE mg/dL
LEUKOCYTES UA: NEGATIVE
Nitrite: NEGATIVE
Protein, ur: 100 mg/dL — AB
Specific Gravity, Urine: 1.008 (ref 1.005–1.030)
pH: 5 (ref 5.0–8.0)

## 2016-11-03 LAB — CBC WITH DIFFERENTIAL/PLATELET
BASOS PCT: 0 %
Basophils Absolute: 0 10*3/uL (ref 0.0–0.1)
EOS PCT: 0 %
Eosinophils Absolute: 0.1 10*3/uL (ref 0.0–0.7)
HCT: 35.3 % — ABNORMAL LOW (ref 36.0–46.0)
Hemoglobin: 11.6 g/dL — ABNORMAL LOW (ref 12.0–15.0)
Lymphocytes Relative: 6 %
Lymphs Abs: 1 10*3/uL (ref 0.7–4.0)
MCH: 29.3 pg (ref 26.0–34.0)
MCHC: 32.9 g/dL (ref 30.0–36.0)
MCV: 89.1 fL (ref 78.0–100.0)
Monocytes Absolute: 0.8 10*3/uL (ref 0.1–1.0)
Monocytes Relative: 5 %
Neutro Abs: 15.8 10*3/uL — ABNORMAL HIGH (ref 1.7–7.7)
Neutrophils Relative %: 89 %
PLATELETS: 288 10*3/uL (ref 150–400)
RBC: 3.96 MIL/uL (ref 3.87–5.11)
RDW: 12.5 % (ref 11.5–15.5)
WBC: 17.8 10*3/uL — AB (ref 4.0–10.5)

## 2016-11-03 MED ORDER — FENTANYL CITRATE (PF) 100 MCG/2ML IJ SOLN
50.0000 ug | Freq: Once | INTRAMUSCULAR | Status: AC
  Administered 2016-11-03: 50 ug via INTRAVENOUS
  Filled 2016-11-03: qty 2

## 2016-11-03 MED ORDER — FENTANYL CITRATE (PF) 100 MCG/2ML IJ SOLN
50.0000 ug | INTRAMUSCULAR | Status: DC | PRN
  Administered 2016-11-03 (×2): 50 ug via INTRAVENOUS
  Filled 2016-11-03 (×2): qty 2

## 2016-11-03 NOTE — ED Notes (Signed)
Per Dr Ralene Bathe allowed to eat until 12, given [place] sandwich and water

## 2016-11-03 NOTE — Progress Notes (Signed)
Patient with displaced right femoral neck fracture.  Will require hemi-arthroplasty.  Presently on OR add-on list for tomorrow afternoon with Dr. Berenice Primas, who will evaluate tomorrow and obtain consent.  1. NPO after midnight 2. NO chemical anti-coagulation--SCDs only

## 2016-11-03 NOTE — ED Triage Notes (Signed)
Per EMS pt from home she was going to use restroom stumbled and fell,R hip pain, denies neck pain or back pain, c/o hip pain no shortening or rotation, IV R hand, pain 4/10, 172/100, 76 HR, 20 RR, 96 RA, CBG 109

## 2016-11-03 NOTE — ED Provider Notes (Signed)
[place] DEPT Provider Note   CSN: [id number] Arrival date & time: 11/03/16  1921     History   Chief Complaint Chief Complaint  Patient presents with  . Hip Pain    HPI ADAMARYS SHALL is a 83 y.o. female.  The history is provided by the patient. No language interpreter was used.  Hip Pain     BERNARDINA CACHO is a 83 y.o. female who presents to the Emergency Department complaining of fall.  She presents for evaluation of hip pain following a fall.  She was walking across the floor and her foot got caught and she fell, striking her right hip on the ground. No head injury or loss of consciousness.  She reports pain to the right hip, thigh and knee area. She laid on the floor for about 20 minutes before she was able to get assistance to get up. She denies a chest pain, shortness breath, abdominal pain, nausea, vomiting. Symptoms are moderate and constant in nature.  Past Medical History:  Diagnosis Date  . Diabetes mellitus   . Hyperlipidemia   . Hypertension     Patient Active Problem List   Diagnosis Date Noted  . Closed displaced fracture of right femoral neck ([place]) 11/03/2016  . Type 2 diabetes mellitus with stage 4 chronic kidney disease, with long-term current use of insulin ([place]) 11/03/2016  . Hyponatremia 11/03/2016  . Leukocytosis 11/03/2016  . Normocytic anemia 11/03/2016  . CKD (chronic kidney disease), stage IV ([place]) 11/03/2016    Past Surgical History:  Procedure Laterality Date  . ABDOMINAL HYSTERECTOMY    . COLONOSCOPY    . POLYPECTOMY      OB History    No data available       Home Medications    Prior to Admission medications   Medication Sig Start Date End Date Taking? Authorizing Provider  atenolol (TENORMIN) 50 MG tablet Take 50 mg by mouth daily.    Yes [provider]  Calcium Carbonate-Vitamin D (CALTRATE 600+D PO) Take 1 tablet by mouth daily.    Yes [provider]  estrogens, conjugated, (PREMARIN) 0.3 MG  tablet Take 0.3 mg by mouth daily. Take daily for 21 days then do not take for 7 days.   Yes [provider]  glipiZIDE (GLUCOTROL) 10 MG 24 hr tablet Take 10 mg by mouth daily with breakfast.    Yes [provider]  insulin detemir (LEVEMIR) 100 UNIT/ML injection Inject 10 Units into the skin daily.   Yes [provider]  lisinopril (PRINIVIL,ZESTRIL) 20 MG tablet Take 20 mg by mouth daily.    Yes [provider]  metFORMIN (GLUCOPHAGE) 1000 MG tablet Take 1,000 mg by mouth 2 (two) times daily with a meal.    Yes [provider]  Multiple Vitamins-Minerals (CENTRUM SILVER PO) Take 1 tablet by mouth daily.   Yes [provider]  niacin 500 MG tablet Take 500 mg by mouth daily.   Yes [provider]  nortriptyline (PAMELOR) 25 MG capsule Take 25 mg by mouth daily.   Yes [provider]  simvastatin (ZOCOR) 20 MG tablet Take 20 mg by mouth daily.   Yes [provider]  triamterene-hydrochlorothiazide (MAXZIDE) 75-50 MG tablet Take 1 tablet by mouth daily.   Yes [provider]    Family History Family History  Problem Relation Age of Onset  . Heart disease Mother   . Heart disease Father   . Heart disease Sister  Social History Social History  Substance Use Topics  . Smoking status: Former Smoker    Quit date: 06/27/1967  . Smokeless tobacco: Never Used  . Alcohol use No     Allergies   Patient has no known allergies.   Review of Systems Review of Systems  All other systems reviewed and are negative.    Physical Exam Updated Vital Signs BP (!) 156/67   Pulse 81   Temp 98.1 F (36.7 C) (Oral)   Resp 18   SpO2 99%   Physical Exam  Constitutional: She is oriented to person, place, and time. She appears well-developed and well-nourished.  HENT:  Head: Normocephalic and atraumatic.  Cardiovascular: Normal rate and regular rhythm.   No murmur heard. Pulmonary/Chest: Effort normal  and breath sounds normal. No respiratory distress.  Abdominal: Soft. There is no tenderness. There is no rebound and no guarding.  Musculoskeletal:  2+ DP pulses bilaterally. Right lower extremities internally rotated and shortened. There is tenderness to palpation throughout the right thigh and hip.  Neurological: She is alert and oriented to person, place, and time.  Skin: Skin is warm and dry.  Psychiatric: She has a normal mood and affect. Her behavior is normal.  Nursing note and vitals reviewed.    ED Treatments / Results  Labs (all labs ordered are listed, but only abnormal results are displayed) Labs Reviewed  BASIC METABOLIC PANEL - Abnormal; Notable for the following:       Result Value   Sodium 130 (*)    CO2 18 (*)    Glucose, Bld 168 (*)    BUN 39 (*)    Creatinine, Ser 2.02 (*)    GFR calc non Af Amer 22 (*)    GFR calc Af Amer 25 (*)    All other components within normal limits  CBC WITH DIFFERENTIAL/PLATELET - Abnormal; Notable for the following:    WBC 17.8 (*)    Hemoglobin 11.6 (*)    HCT 35.3 (*)    Neutro Abs 15.8 (*)    All other components within normal limits  URINALYSIS, ROUTINE W REFLEX MICROSCOPIC - Abnormal; Notable for the following:    Color, Urine STRAW (*)    Hgb urine dipstick SMALL (*)    Protein, ur 100 (*)    Squamous Epithelial / LPF 0-5 (*)    All other components within normal limits    EKG  EKG Interpretation None       Radiology Dg Chest 1 View  Result Date: 11/03/2016 CLINICAL DATA:  Fall in living room EXAM: CHEST 1 VIEW COMPARISON:  03/08/2012 FINDINGS: Mild atelectasis at the left base. No acute consolidation or effusion. Stable cardiomediastinal silhouette. No pneumothorax. IMPRESSION: Mild hypoventilatory changes. No radiographic evidence for acute cardiopulmonary abnormality Electronically Signed   By: Donavan Foil M.D.   On: 11/03/2016 21:12   Dg Hip Unilat W Or Wo Pelvis 2-3 Views Right  Result Date:  11/03/2016 CLINICAL DATA:  83 y/o F; status post fall with right leg deformity. EXAM: DG HIP (WITH OR WITHOUT PELVIS) 2-3V RIGHT COMPARISON:  None. FINDINGS: No hip dislocation. Acute transcervical fracture of the right femoral neck with coxa vara angulation, and mild superior displacement of the femoral shaft. Chronic fracture deformity of the left inferior pubic ramus. Age-indeterminate fracture deformity of the left superior pubic ramus. Extensive vascular calcification. IMPRESSION: 1. Acute transcervical fracture of the right femoral neck with coxa vara angulation and mild superior displacement of the femoral shaft. 2.  Chronic fracture deformity of the left inferior pubic ramus. 3. Age-indeterminate fracture deformity of the left superior pubic ramus. Electronically Signed   By: Kristine Garbe M.D.   On: 11/03/2016 21:13   Dg Femur Min 2 Views Right  Result Date: 11/03/2016 CLINICAL DATA:  83 y/o F; status post fall with right leg deformity. EXAM: RIGHT FEMUR 2 VIEWS COMPARISON:  None. FINDINGS: No hip dislocation. Acute transcervical fracture of the right femoral neck with coxa vara angulation, and mild superior displacement of the femoral shaft. Extensive vascular calcification. Knee joint is grossly maintained. Superior patellar enthesophyte. IMPRESSION: Acute transcervical fracture of the right femoral neck with coxa vara angulation and mild superior displacement of the femoral shaft. Electronically Signed   By: Kristine Garbe M.D.   On: 11/03/2016 21:14    Procedures Procedures (including critical care time)  Medications Ordered in ED Medications  fentaNYL (SUBLIMAZE) injection 50 mcg (50 mcg Intravenous Given 11/03/16 2135)  fentaNYL (SUBLIMAZE) injection 50 mcg (50 mcg Intravenous Given 11/03/16 2010)     Initial Impression / Assessment and Plan / ED Course  I have reviewed the triage vital signs and the nursing notes.  Pertinent labs & imaging results that were  available during my care of the patient were reviewed by me and considered in my medical decision making (see chart for details).     Pt here for evaluation of injuries following a mechanical fall, has a right femoral neck fracture.  She is NVI on exam.  Labs demonstrate slight worsening in CKD - no recent BMP available for comparison.   D/w Dr. Grandville Silos with [facility] - Dr. Berenice Primas will see her in the morning for surgery tomorrow.   Hospitalist consulted for admission for further treatment.  Pt and family updated of findings of studies and recommendation for admission and they are in agreement with plan.    Final Clinical Impressions(s) / ED Diagnoses   Final diagnoses:  None    New Prescriptions New Prescriptions   No medications on file     Quintella Reichert, MD 11/03/16 2344

## 2016-11-03 NOTE — Consult Note (Signed)
Reason for Consult:displaced femoral neck fracture right Referring Physician: hospitalists  Abigail Huang is an 83 y.o. female.  HPI: the patient is an elderly female who fell earlier today.  She suffered a displaced right femoral neck fracture.  We are consult for management of her femoral neck fracture.   She is admitted to the medicine service and cleared for surgical intervention.  Past Medical History:  Diagnosis Date  . Diabetes mellitus   . Hyperlipidemia   . Hypertension     Past Surgical History:  Procedure Laterality Date  . ABDOMINAL HYSTERECTOMY    . COLONOSCOPY    . POLYPECTOMY      Family History  Problem Relation Age of Onset  . Heart disease Mother   . Heart disease Father   . Heart disease Sister     Social History:  reports that she quit smoking about 49 years ago. She has never used smokeless tobacco. She reports that she does not drink alcohol or use drugs.  Allergies: No Known Allergies  Medications: I have reviewed the patient's current medications.  Results for orders placed or performed during the hospital encounter of 11/03/16 (from the past 48 hour(s))  Basic metabolic panel     Status: Abnormal   Collection Time: 11/03/16  7:42 PM  Result Value Ref Range   Sodium 130 (L) 135 - 145 mmol/L   Potassium 4.8 3.5 - 5.1 mmol/L   Chloride 103 101 - 111 mmol/L   CO2 18 (L) 22 - 32 mmol/L   Glucose, Bld 168 (H) 65 - 99 mg/dL   BUN 39 (H) 6 - 20 mg/dL   Creatinine, Ser 2.02 (H) 0.44 - 1.00 mg/dL   Calcium 9.7 8.9 - 10.3 mg/dL   GFR calc non Af Amer 22 (L) >60 mL/min   GFR calc Af Amer 25 (L) >60 mL/min    Comment: (NOTE) The eGFR has been calculated using the CKD EPI equation. This calculation has not been validated in all clinical situations. eGFR's persistently <60 mL/min signify possible Chronic Kidney Disease.    Anion gap 9 5 - 15  CBC with Differential     Status: Abnormal   Collection Time: 11/03/16  7:42 PM  Result Value Ref Range   WBC 17.8 (H) 4.0 - 10.5 K/uL   RBC 3.96 3.87 - 5.11 MIL/uL   Hemoglobin 11.6 (L) 12.0 - 15.0 g/dL   HCT 35.3 (L) 36.0 - 46.0 %   MCV 89.1 78.0 - 100.0 fL   MCH 29.3 26.0 - 34.0 pg   MCHC 32.9 30.0 - 36.0 g/dL   RDW 12.5 11.5 - 15.5 %   Platelets 288 150 - 400 K/uL   Neutrophils Relative % 89 %   Neutro Abs 15.8 (H) 1.7 - 7.7 K/uL   Lymphocytes Relative 6 %   Lymphs Abs 1.0 0.7 - 4.0 K/uL   Monocytes Relative 5 %   Monocytes Absolute 0.8 0.1 - 1.0 K/uL   Eosinophils Relative 0 %   Eosinophils Absolute 0.1 0.0 - 0.7 K/uL   Basophils Relative 0 %   Basophils Absolute 0.0 0.0 - 0.1 K/uL  Urinalysis, Routine w reflex microscopic     Status: Abnormal   Collection Time: 11/03/16 11:03 PM  Result Value Ref Range   Color, Urine STRAW (A) YELLOW   APPearance CLEAR CLEAR   Specific Gravity, Urine 1.008 1.005 - 1.030   pH 5.0 5.0 - 8.0   Glucose, UA NEGATIVE NEGATIVE mg/dL   Hgb urine  dipstick SMALL (A) NEGATIVE   Bilirubin Urine NEGATIVE NEGATIVE   Ketones, ur NEGATIVE NEGATIVE mg/dL   Protein, ur 100 (A) NEGATIVE mg/dL   Nitrite NEGATIVE NEGATIVE   Leukocytes, UA NEGATIVE NEGATIVE   RBC / HPF 0-5 0 - 5 RBC/hpf   WBC, UA 0-5 0 - 5 WBC/hpf   Bacteria, UA NONE SEEN NONE SEEN   Squamous Epithelial / LPF 0-5 (A) NONE SEEN    Dg Chest 1 View  Result Date: 11/03/2016 CLINICAL DATA:  Fall in living room EXAM: CHEST 1 VIEW COMPARISON:  03/08/2012 FINDINGS: Mild atelectasis at the left base. No acute consolidation or effusion. Stable cardiomediastinal silhouette. No pneumothorax. IMPRESSION: Mild hypoventilatory changes. No radiographic evidence for acute cardiopulmonary abnormality Electronically Signed   By: Donavan Foil M.D.   On: 11/03/2016 21:12   Dg Hip Unilat W Or Wo Pelvis 2-3 Views Right  Result Date: 11/03/2016 CLINICAL DATA:  83 y/o F; status post fall with right leg deformity. EXAM: DG HIP (WITH OR WITHOUT PELVIS) 2-3V RIGHT COMPARISON:  None. FINDINGS: No hip dislocation. Acute  transcervical fracture of the right femoral neck with coxa vara angulation, and mild superior displacement of the femoral shaft. Chronic fracture deformity of the left inferior pubic ramus. Age-indeterminate fracture deformity of the left superior pubic ramus. Extensive vascular calcification. IMPRESSION: 1. Acute transcervical fracture of the right femoral neck with coxa vara angulation and mild superior displacement of the femoral shaft. 2. Chronic fracture deformity of the left inferior pubic ramus. 3. Age-indeterminate fracture deformity of the left superior pubic ramus. Electronically Signed   By: Kristine Garbe M.D.   On: 11/03/2016 21:13   Dg Femur Min 2 Views Right  Result Date: 11/03/2016 CLINICAL DATA:  83 y/o F; status post fall with right leg deformity. EXAM: RIGHT FEMUR 2 VIEWS COMPARISON:  None. FINDINGS: No hip dislocation. Acute transcervical fracture of the right femoral neck with coxa vara angulation, and mild superior displacement of the femoral shaft. Extensive vascular calcification. Knee joint is grossly maintained. Superior patellar enthesophyte. IMPRESSION: Acute transcervical fracture of the right femoral neck with coxa vara angulation and mild superior displacement of the femoral shaft. Electronically Signed   By: Kristine Garbe M.D.   On: 11/03/2016 21:14    ROS  ROS: I have reviewed the patient's review of systems thoroughly and there are no positive responses as relates to the HPI. Blood pressure (!) 154/65, pulse 78, temperature 98.1 F (36.7 C), temperature source Oral, resp. rate 17, SpO2 97 %. Physical Exam Well-developed well-nourished patient in no acute distress. Alert and oriented x3 HEENT:within normal limits Cardiac: Regular rate and rhythm Pulmonary: Lungs clear to auscultation Abdomen: Soft and nontender.  Normal active bowel sounds  Musculoskeletal: (right hip: Painful range of motion.  Limited range of motion.  External rotation and  shortening deformity. Assessment/Plan: The patient is an 83 year old female with displaced right femoral neck fracture.  We have discussed at length the nature of the problem and potential solutions.  After fully understanding the potential risks and benefits she'll be taken to the operating room for right hemiarthroplasty when the operating room is available.  Pahoua Schreiner L 11/03/2016, 11:37 PM

## 2016-11-03 NOTE — Progress Notes (Signed)
Patient ID: Abigail Huang, female   DOB: 02-16-1931, 83 y.o.   MRN: [id number] I have reviewed the patient's x-rays and she will need hemiarthroplasty right hip.  I will plan on proceeding with this tomorrow.  I have written a consult note and have pended it until I see her.  A note was written for convenience sake and will be amended as necessary after evaluating her tomorrow.

## 2016-11-03 NOTE — ED Notes (Signed)
Husband  Angelena Sand: [phone]

## 2016-11-03 NOTE — H&P (Signed)
History and Physical  Patient Name: Abigail Huang     LGX:[id number]    DOB: 04-09-1931    DOA: 11/03/2016 Referring provider: Quintella Reichert, MD PCP: Burnard Bunting, MD  Outpatient specialists: Dr. Ranee Gosselin Ortho Patient coming from: Home     Chief Complaint: Hip pain and fall  HPI: Abigail Huang is a 83 y.o. female with a past medical history significant for HTN, IDDM, and CKD who presents with hip pain after a fall.  The patient was in her normal state of health until this evening when she was getting ready to go to [employer] to pick up hotdogs for dinner, went to check her hair in the bathroom, her tennis shoes caught on the hardwoods and she tripped, falling on her right side.  She had immediate RIGHT hip pain and could not walk.    ED course: -Afebrile, heart rate 83, respirations pulse ox normal, blood pressure initially 185/67 -Sodium 130, potassium 4.8, creatinine 2.0 (baseline 1.5 to years ago), WBC 17.8 K, hemoglobin 11.6 and normocytic -Radiograph of the pelvis showed a displaced right femoral neck fracture -Chest x-ray clear -The case was discussed with orthopedics, who recommended nothing by mouth at midnight, surgery tomorrow  There was no preceding dizziness, weakness, lightheadedness or vertigo, no chest discomfort, palpitations, or dyspnea, nor are there any of those symptoms now.  The patient denies active heart issues, angina, or history of MI. She does not climb stairs but can exert to an equivalent of 4 METS doing housework without chest pain or dyspnea.  She denies history of TIAs/CVAs, CAD, CHF. She has no history of COPD or symptoms of OSA, including snoring, daytime drowsiness, apnea.  She has no history of prolonged steroid use.  She does not use alcohol, and has no history of withdrawal symptoms or delirium in the context of previous surgeries.  Anesthesia Specific concerns: Presence of loose teeth: None, upper dentures Anesthesia problems in  past: None History of bleeding disorder: None  Review of Systems:  Review of Systems  Constitutional: Negative for chills and fever.  Respiratory: Negative for cough, sputum production and shortness of breath.   Cardiovascular: Negative for chest pain, palpitations, orthopnea, leg swelling and PND.  Musculoskeletal: Positive for falls and joint pain.  Neurological: Negative for dizziness, tingling, tremors, sensory change, speech change, focal weakness, seizures, loss of consciousness and headaches.  All other systems reviewed and are negative.         Past Medical History:  Diagnosis Date  . Diabetes mellitus   . Hyperlipidemia   . Hypertension     Past Surgical History:  Procedure Laterality Date  . ABDOMINAL HYSTERECTOMY    . COLONOSCOPY    . POLYPECTOMY      Social History: Patient lives with her husband.  Patient walks unassisted without cane or walker.  She is a remote former smoker.  She still drives.  She lives in [place].  Worked as a Scientist, water quality briefly, mostly homemaker.    No Known Allergies  Family history: family history includes Heart disease in her father, mother, and sister.  Prior to Admission medications   Medication Sig Start Date End Date Taking? Authorizing Provider  atenolol (TENORMIN) 50 MG tablet Take 50 mg by mouth daily.    Yes [provider]  Calcium Carbonate-Vitamin D (CALTRATE 600+D PO) Take 1 tablet by mouth daily.    Yes [provider]  estrogens, conjugated, (PREMARIN) 0.3 MG tablet Take 0.3 mg by mouth daily. Take daily  for 21 days then do not take for 7 days.   Yes [provider]  glipiZIDE (GLUCOTROL) 10 MG 24 hr tablet Take 10 mg by mouth daily with breakfast.    Yes [provider]  insulin detemir (LEVEMIR) 100 UNIT/ML injection Inject 10 Units into the skin daily.   Yes [provider]  lisinopril (PRINIVIL,ZESTRIL) 20 MG tablet Take 20 mg by mouth daily.    Yes [provider]    metFORMIN (GLUCOPHAGE) 1000 MG tablet Take 1,000 mg by mouth 2 (two) times daily with a meal.    Yes [provider]  Multiple Vitamins-Minerals (CENTRUM SILVER PO) Take 1 tablet by mouth daily.   Yes [provider]  niacin 500 MG tablet Take 500 mg by mouth daily.   Yes [provider]  nortriptyline (PAMELOR) 25 MG capsule Take 25 mg by mouth daily.   Yes [provider]  simvastatin (ZOCOR) 20 MG tablet Take 20 mg by mouth daily.   Yes [provider]  triamterene-hydrochlorothiazide (MAXZIDE) 75-50 MG tablet Take 1 tablet by mouth daily.   Yes [provider]       Physical Exam: BP (!) 154/65   Pulse 78   Temp 98.1 F (36.7 C) (Oral)   Resp 17   SpO2 97%  General appearance: Thin elderly adult female, alert and in no acute distress.   Eyes: Anicteric, conjunctiva pink, lids and lashes normal. PERRL.    ENT: No nasal deformity, discharge, epistaxis.  Hearing normal. OP moist without lesions.  Dentition good, upper dentures.    Lymph: No cervical, supraclavicular or axillary lymphadenopathy. Skin: Warm and dry.  No jaundice.  No suspicious rashes or lesions. Cardiac: RRR, nl S1-S2, no murmurs appreciated.  Capillary refill is brisk.  JVP normal.  No LE edema.  Radial and DP pulses 2+ and symmetric.  No carotid bruits. Respiratory: Normal respiratory rate and rhythm.  CTAB without rales or wheezes. GI: Abdomen soft without rigidity.  No TTP. No ascites, distension, no hepatosplenomegaly.  MSK: RIGHT leg foreshortened and externally rotated.  No effusions.  No clubbing or cyanosis. Neuro: Cranial nerves 3-12 intact.  Sensorium intact and responding to questions, attention normal.  Speech is fluent.  Moves all extremities equally and with normal coordination excvept right leg.    Psych: The patient is oriented to time, place and person. Behavior appropriate.  Affect normal.  Recall, recent and remote, as well as general fund of  knowledge seem within normal limits. No evidence of aural or visual hallucinations or delusions.     Labs on Admission:  I have personally reviewed following labs and imaging studies: CBC:  Recent Labs Lab 11/03/16 1942  WBC 17.8*  NEUTROABS 15.8*  HGB 11.6*  HCT 35.3*  MCV 89.1  PLT 268   Basic Metabolic Panel:  Recent Labs Lab 11/03/16 1942  NA 130*  K 4.8  CL 103  CO2 18*  GLUCOSE 168*  BUN 39*  CREATININE 2.02*  CALCIUM 9.7   GFR: CrCl cannot be calculated (Unknown ideal weight.).  Liver Function Tests: No results for input(s): AST, ALT, ALKPHOS, BILITOT, PROT, ALBUMIN in the last 168 hours. No results for input(s): LIPASE, AMYLASE in the last 168 hours. No results for input(s): AMMONIA in the last 168 hours. Coagulation Profile: No results for input(s): INR, PROTIME in the last 168 hours. Cardiac Enzymes: No results for input(s): CKTOTAL, CKMB, CKMBINDEX, TROPONINI in the last 168 hours. BNP (last 3 results) No results  for input(s): PROBNP in the last 8760 hours. HbA1C: No results for input(s): HGBA1C in the last 72 hours. CBG: No results for input(s): GLUCAP in the last 168 hours. Lipid Profile: No results for input(s): CHOL, HDL, LDLCALC, TRIG, CHOLHDL, LDLDIRECT in the last 72 hours. Thyroid Function Tests: No results for input(s): TSH, T4TOTAL, FREET4, T3FREE, THYROIDAB in the last 72 hours. Anemia Panel: No results for input(s): VITAMINB12, FOLATE, FERRITIN, TIBC, IRON, RETICCTPCT in the last 72 hours.    Radiological Exams on Admission: Personally reviewed: Dg Chest 1 View  Result Date: 11/03/2016 CLINICAL DATA:  Fall in living room EXAM: CHEST 1 VIEW COMPARISON:  03/08/2012 FINDINGS: Mild atelectasis at the left base. No acute consolidation or effusion. Stable cardiomediastinal silhouette. No pneumothorax. IMPRESSION: Mild hypoventilatory changes. No radiographic evidence for acute cardiopulmonary abnormality Electronically Signed   By: Donavan Foil M.D.   On: 11/03/2016 21:12   Dg Hip Unilat W Or Wo Pelvis 2-3 Views Right  Result Date: 11/03/2016 CLINICAL DATA:  83 y/o F; status post fall with right leg deformity. EXAM: DG HIP (WITH OR WITHOUT PELVIS) 2-3V RIGHT COMPARISON:  None. FINDINGS: No hip dislocation. Acute transcervical fracture of the right femoral neck with coxa vara angulation, and mild superior displacement of the femoral shaft. Chronic fracture deformity of the left inferior pubic ramus. Age-indeterminate fracture deformity of the left superior pubic ramus. Extensive vascular calcification. IMPRESSION: 1. Acute transcervical fracture of the right femoral neck with coxa vara angulation and mild superior displacement of the femoral shaft. 2. Chronic fracture deformity of the left inferior pubic ramus. 3. Age-indeterminate fracture deformity of the left superior pubic ramus. Electronically Signed   By: Kristine Garbe M.D.   On: 11/03/2016 21:13   Dg Femur Min 2 Views Right  Result Date: 11/03/2016 CLINICAL DATA:  83 y/o F; status post fall with right leg deformity. EXAM: RIGHT FEMUR 2 VIEWS COMPARISON:  None. FINDINGS: No hip dislocation. Acute transcervical fracture of the right femoral neck with coxa vara angulation, and mild superior displacement of the femoral shaft. Extensive vascular calcification. Knee joint is grossly maintained. Superior patellar enthesophyte. IMPRESSION: Acute transcervical fracture of the right femoral neck with coxa vara angulation and mild superior displacement of the femoral shaft. Electronically Signed   By: Kristine Garbe M.D.   On: 11/03/2016 21:14    EKG: Independently reviewed. Rate 80. Qtc 420, no ST changes.       Assessment and Plan: 1. Hip fracture: The patient will be seen by Dr. Berenice Primas in the morning at [facility], to evaluate for operative fixation of the RIGHT hip.   -Admit to med-surg bed -Hydrocodone-acetaminophen or morphine as tolerated for pain -Bed rest,  apply ice, document sedation and vitals per Hip fracture protocol -NPO at midnight -MIVF -Nutrition consulted -Hold following meds:  -ACE inhibitor, oral diabetes meds, Premarin   Overall, the patient is at intermediate risk for the planned surgery.  Patient has a RCRI score of 2.  Lyndel Safe risk only 1.59%. The patient has no active cardiac symptoms, a functional capacity >4 METs, and would be expected to be at average risk for cardiac complications from this intermediate risk procedure. -No further testing needed  Pulmonary: Patient does not have diagnosed COPD or OSA. Patient is at average risk for pulmonary complications.   Endocrine: Patient has no history of steroid use.  Uses insulin for diabetes.  Heme: Transfusion threshold 7 mg/dL.  Post-operative medical care: Per AAOS 2014 guidelines on hip fractures in the  elderly: -Recommend osteoporosis screening after discharge if not done previously -Recommend vitamin D 800 IUand calcium 1200 mg supplementation after discharge    2. Hypertension: Hypertensive at admission, improved with pain control. -Continue BB, triamterene, HCTZ -Hold lisinopril, restart post-operatively -Continue statin  3. Insulin-dependent diabetes: -Hold glipizide, metformin -Continue Levemir, slightly lower dose while NPO -SSI every 4 while NPO -Continue nortriptyline -Consider aspirin 81 mg post-operatively  4. Chronic kidney disease: Last baseline 1.5 2 years ago, patient unaware of CKD. -Check FenA -Fluids and trend SCr  5. Anemia of chronic kidney disease: Progressed from previous.  Suspect from kidney disease. -Check ferritin, iron, B12, retics -Transfusion threshold 7 g/dL  6. Hyponatremia: On HCTZ. -Check urine sodium, free water clearance -Trend Na post-op   7. Other medications: -Hold estrogen     DVT prophylaxis: SCDs  Diet: NPO Code Status: FULL  Family Communication: Daughter and hustband at bedside  Disposition Plan:  Anticipate evaluation by Orthopedics and surgical fixation tomorrow, then PT evaluation and discharge to SNF in 2-3 days. Admission status: INPATIENT for hip fracture, medical surgical bed     Medical decision making and consults: Patient seen at 11:10 PM on 11/03/2016.  The patient was discussed with Dr. Ralene Bathe. What exists of the patient's chart was reviewed in depth and summarized above.  Clinical condition: stable.      Edwin Dada [facility] Pager [phone]

## 2016-11-03 NOTE — ED Notes (Signed)
Patient transported to X-ray 

## 2016-11-04 ENCOUNTER — Inpatient Hospital Stay (HOSPITAL_COMMUNITY): Payer: Medicare HMO | Admitting: Certified Registered Nurse Anesthetist

## 2016-11-04 ENCOUNTER — Encounter (HOSPITAL_COMMUNITY)
Admission: EM | Disposition: A | Discharge status: Skilled Nursing Facility | Payer: Self-pay | Source: Home / Self Care | Attending: Internal Medicine

## 2016-11-04 ENCOUNTER — Encounter (HOSPITAL_COMMUNITY): Payer: Self-pay | Admitting: Certified Registered Nurse Anesthetist

## 2016-11-04 ENCOUNTER — Inpatient Hospital Stay (HOSPITAL_COMMUNITY): Payer: Medicare HMO

## 2016-11-04 DIAGNOSIS — E785 Hyperlipidemia, unspecified: Secondary | ICD-10-CM | POA: Diagnosis not present

## 2016-11-04 DIAGNOSIS — M810 Age-related osteoporosis without current pathological fracture: Secondary | ICD-10-CM | POA: Diagnosis not present

## 2016-11-04 DIAGNOSIS — S72001A Fracture of unspecified part of neck of right femur, initial encounter for closed fracture: Principal | ICD-10-CM

## 2016-11-04 DIAGNOSIS — E1122 Type 2 diabetes mellitus with diabetic chronic kidney disease: Secondary | ICD-10-CM | POA: Diagnosis not present

## 2016-11-04 DIAGNOSIS — D631 Anemia in chronic kidney disease: Secondary | ICD-10-CM | POA: Diagnosis not present

## 2016-11-04 DIAGNOSIS — I129 Hypertensive chronic kidney disease with stage 1 through stage 4 chronic kidney disease, or unspecified chronic kidney disease: Secondary | ICD-10-CM | POA: Diagnosis not present

## 2016-11-04 DIAGNOSIS — D500 Iron deficiency anemia secondary to blood loss (chronic): Secondary | ICD-10-CM | POA: Diagnosis not present

## 2016-11-04 DIAGNOSIS — E871 Hypo-osmolality and hyponatremia: Secondary | ICD-10-CM

## 2016-11-04 DIAGNOSIS — D72829 Elevated white blood cell count, unspecified: Secondary | ICD-10-CM | POA: Diagnosis not present

## 2016-11-04 DIAGNOSIS — N184 Chronic kidney disease, stage 4 (severe): Secondary | ICD-10-CM | POA: Diagnosis not present

## 2016-11-04 HISTORY — PX: ANTERIOR APPROACH HEMI HIP ARTHROPLASTY: SHX6690

## 2016-11-04 LAB — GLUCOSE, CAPILLARY
GLUCOSE-CAPILLARY: 105 mg/dL — AB (ref 65–99)
GLUCOSE-CAPILLARY: 211 mg/dL — AB (ref 65–99)
GLUCOSE-CAPILLARY: 317 mg/dL — AB (ref 65–99)
GLUCOSE-CAPILLARY: 78 mg/dL (ref 65–99)
GLUCOSE-CAPILLARY: 99 mg/dL (ref 65–99)
Glucose-Capillary: 66 mg/dL (ref 65–99)
Glucose-Capillary: 93 mg/dL (ref 65–99)
Glucose-Capillary: 96 mg/dL (ref 65–99)

## 2016-11-04 LAB — FERRITIN: Ferritin: 53 ng/mL (ref 11–307)

## 2016-11-04 LAB — BASIC METABOLIC PANEL
ANION GAP: 7 (ref 5–15)
BUN: 33 mg/dL — ABNORMAL HIGH (ref 6–20)
CALCIUM: 9.7 mg/dL (ref 8.9–10.3)
CO2: 20 mmol/L — ABNORMAL LOW (ref 22–32)
Chloride: 106 mmol/L (ref 101–111)
Creatinine, Ser: 1.8 mg/dL — ABNORMAL HIGH (ref 0.44–1.00)
GFR calc Af Amer: 29 mL/min — ABNORMAL LOW (ref >60)
GFR, EST NON AFRICAN AMERICAN: 25 mL/min — AB (ref >60)
GLUCOSE: 79 mg/dL (ref 65–99)
Potassium: 4.3 mmol/L (ref 3.5–5.1)
SODIUM: 133 mmol/L — AB (ref 135–145)

## 2016-11-04 LAB — IRON AND TIBC
IRON: 33 ug/dL (ref 28–170)
Saturation Ratios: 10 % — ABNORMAL LOW (ref 10.4–31.8)
TIBC: 329 ug/dL (ref 250–450)
UIBC: 296 ug/dL

## 2016-11-04 LAB — CBC
HCT: 33.3 % — ABNORMAL LOW (ref 36.0–46.0)
Hemoglobin: 11 g/dL — ABNORMAL LOW (ref 12.0–15.0)
MCH: 29.3 pg (ref 26.0–34.0)
MCHC: 33 g/dL (ref 30.0–36.0)
MCV: 88.6 fL (ref 78.0–100.0)
PLATELETS: 276 10*3/uL (ref 150–400)
RBC: 3.76 MIL/uL — ABNORMAL LOW (ref 3.87–5.11)
RDW: 12.7 % (ref 11.5–15.5)
WBC: 10.4 10*3/uL (ref 4.0–10.5)

## 2016-11-04 LAB — SURGICAL PCR SCREEN
MRSA, PCR: NEGATIVE
STAPHYLOCOCCUS AUREUS: NEGATIVE

## 2016-11-04 LAB — RETICULOCYTES
RBC.: 3.75 MIL/uL — ABNORMAL LOW (ref 3.87–5.11)
RETIC COUNT ABSOLUTE: 33.8 10*3/uL (ref 19.0–186.0)
Retic Ct Pct: 0.9 % (ref 0.4–3.1)

## 2016-11-04 LAB — TYPE AND SCREEN
ABO/RH(D): O POS
Antibody Screen: NEGATIVE

## 2016-11-04 LAB — ABO/RH: ABO/RH(D): O POS

## 2016-11-04 LAB — OSMOLALITY: OSMOLALITY: 291 mosm/kg (ref 275–295)

## 2016-11-04 LAB — VITAMIN B12: Vitamin B-12: 281 pg/mL (ref 180–914)

## 2016-11-04 SURGERY — HEMIARTHROPLASTY, HIP, DIRECT ANTERIOR APPROACH, FOR FRACTURE
Anesthesia: Spinal | Laterality: Right

## 2016-11-04 MED ORDER — ONDANSETRON HCL 4 MG/2ML IJ SOLN
INTRAMUSCULAR | Status: DC | PRN
  Administered 2016-11-04: 4 mg via INTRAVENOUS

## 2016-11-04 MED ORDER — DOCUSATE SODIUM 100 MG PO CAPS
100.0000 mg | ORAL_CAPSULE | Freq: Two times a day (BID) | ORAL | Status: DC
  Administered 2016-11-04 – 2016-11-08 (×8): 100 mg via ORAL
  Filled 2016-11-04 (×8): qty 1

## 2016-11-04 MED ORDER — PROPOFOL 10 MG/ML IV BOLUS
INTRAVENOUS | Status: AC
  Filled 2016-11-04: qty 20

## 2016-11-04 MED ORDER — PROPOFOL 10 MG/ML IV BOLUS
INTRAVENOUS | Status: DC | PRN
  Administered 2016-11-04 (×2): 10 mg via INTRAVENOUS

## 2016-11-04 MED ORDER — POVIDONE-IODINE 10 % EX SWAB: 2.0000 "application " | Freq: Once | CUTANEOUS | Status: DC

## 2016-11-04 MED ORDER — INSULIN DETEMIR 100 UNIT/ML ~~LOC~~ SOLN
7.0000 [IU] | Freq: Every day | SUBCUTANEOUS | Status: DC
  Administered 2016-11-05: 7 [IU] via SUBCUTANEOUS
  Filled 2016-11-04: qty 0.07

## 2016-11-04 MED ORDER — BUPIVACAINE HCL 0.5 % IJ SOLN
INTRAMUSCULAR | Status: DC | PRN
  Administered 2016-11-04: 20 mL

## 2016-11-04 MED ORDER — LIDOCAINE 2% (20 MG/ML) 5 ML SYRINGE
INTRAMUSCULAR | Status: AC
  Filled 2016-11-04: qty 5

## 2016-11-04 MED ORDER — PHENYLEPHRINE HCL 10 MG/ML IJ SOLN
INTRAVENOUS | Status: DC | PRN
  Administered 2016-11-04: 20 ug/min via INTRAVENOUS

## 2016-11-04 MED ORDER — BISACODYL 10 MG RE SUPP: 10.0000 mg | Freq: Every day | RECTAL | Status: DC | PRN

## 2016-11-04 MED ORDER — SODIUM CHLORIDE 0.9 % IV SOLN
INTRAVENOUS | Status: DC
  Administered 2016-11-04 – 2016-11-05 (×3): via INTRAVENOUS

## 2016-11-04 MED ORDER — BUPIVACAINE LIPOSOME 1.3 % IJ SUSP
INTRAMUSCULAR | Status: DC | PRN
  Administered 2016-11-04: 20 mL

## 2016-11-04 MED ORDER — GLIPIZIDE ER 10 MG PO TB24
10.0000 mg | ORAL_TABLET | Freq: Every day | ORAL | Status: DC
  Administered 2016-11-05 – 2016-11-08 (×4): 10 mg via ORAL
  Filled 2016-11-04 (×4): qty 1

## 2016-11-04 MED ORDER — BUPIVACAINE LIPOSOME 1.3 % IJ SUSP
20.0000 mL | INTRAMUSCULAR | Status: DC
  Filled 2016-11-04: qty 20

## 2016-11-04 MED ORDER — GLUCERNA SHAKE PO LIQD
237.0000 mL | Freq: Two times a day (BID) | ORAL | Status: DC
  Administered 2016-11-05 – 2016-11-08 (×7): 237 mL via ORAL

## 2016-11-04 MED ORDER — ONDANSETRON HCL 4 MG/2ML IJ SOLN
INTRAMUSCULAR | Status: AC
  Filled 2016-11-04: qty 2

## 2016-11-04 MED ORDER — CHLORHEXIDINE GLUCONATE 4 % EX LIQD
60.0000 mL | Freq: Once | CUTANEOUS | Status: AC
  Administered 2016-11-04: 4 via TOPICAL
  Filled 2016-11-04: qty 60

## 2016-11-04 MED ORDER — PROTAMINE SULFATE 10 MG/ML IV SOLN
INTRAVENOUS | Status: AC
  Filled 2016-11-04: qty 5

## 2016-11-04 MED ORDER — ONDANSETRON HCL 4 MG/2ML IJ SOLN
INTRAMUSCULAR | Status: AC
  Filled 2016-11-04: qty 4

## 2016-11-04 MED ORDER — PROPOFOL 500 MG/50ML IV EMUL
INTRAVENOUS | Status: DC | PRN
  Administered 2016-11-04: 75 ug/kg/min via INTRAVENOUS

## 2016-11-04 MED ORDER — SODIUM CHLORIDE 0.9 % IV SOLN
1000.0000 mg | INTRAVENOUS | Status: AC
  Administered 2016-11-04: 1000 mg via INTRAVENOUS
  Filled 2016-11-04: qty 10

## 2016-11-04 MED ORDER — NORTRIPTYLINE HCL 25 MG PO CAPS
25.0000 mg | ORAL_CAPSULE | Freq: Every day | ORAL | Status: DC
  Administered 2016-11-05 – 2016-11-08 (×4): 25 mg via ORAL
  Filled 2016-11-04 (×5): qty 1

## 2016-11-04 MED ORDER — ASPIRIN EC 325 MG PO TBEC: 325.0000 mg | DELAYED_RELEASE_TABLET | Freq: Two times a day (BID) | ORAL | 0 refills | Status: DC

## 2016-11-04 MED ORDER — SODIUM CHLORIDE 0.9 % IV SOLN
INTRAVENOUS | Status: DC
  Administered 2016-11-04: 02:00:00 via INTRAVENOUS

## 2016-11-04 MED ORDER — ACETAMINOPHEN 325 MG PO TABS: 650.0000 mg | ORAL_TABLET | Freq: Four times a day (QID) | ORAL | Status: DC | PRN

## 2016-11-04 MED ORDER — PHENYLEPHRINE 40 MCG/ML (10ML) SYRINGE FOR IV PUSH (FOR BLOOD PRESSURE SUPPORT)
PREFILLED_SYRINGE | INTRAVENOUS | Status: AC
  Filled 2016-11-04: qty 10

## 2016-11-04 MED ORDER — PROTAMINE SULFATE 10 MG/ML IV SOLN
INTRAVENOUS | Status: AC
  Filled 2016-11-04: qty 10

## 2016-11-04 MED ORDER — ASPIRIN EC 325 MG PO TBEC
325.0000 mg | DELAYED_RELEASE_TABLET | Freq: Two times a day (BID) | ORAL | Status: DC
  Administered 2016-11-04 – 2016-11-08 (×8): 325 mg via ORAL
  Filled 2016-11-04 (×8): qty 1

## 2016-11-04 MED ORDER — FENTANYL CITRATE (PF) 250 MCG/5ML IJ SOLN
INTRAMUSCULAR | Status: AC
  Filled 2016-11-04: qty 5

## 2016-11-04 MED ORDER — CEFAZOLIN SODIUM-DEXTROSE 2-4 GM/100ML-% IV SOLN
2.0000 g | Freq: Two times a day (BID) | INTRAVENOUS | Status: AC
  Administered 2016-11-04 – 2016-11-05 (×2): 2 g via INTRAVENOUS
  Filled 2016-11-04 (×2): qty 100

## 2016-11-04 MED ORDER — BUPIVACAINE IN DEXTROSE 0.75-8.25 % IT SOLN
INTRATHECAL | Status: DC | PRN
  Administered 2016-11-04: 1.6 mL via INTRATHECAL

## 2016-11-04 MED ORDER — POLYETHYLENE GLYCOL 3350 17 G PO PACK
17.0000 g | PACK | Freq: Every day | ORAL | Status: DC | PRN
  Administered 2016-11-07: 17 g via ORAL
  Filled 2016-11-04: qty 1

## 2016-11-04 MED ORDER — DEXTROSE 50 % IV SOLN
25.0000 mL | INTRAVENOUS | Status: DC | PRN
  Administered 2016-11-04: 25 mL via INTRAVENOUS
  Filled 2016-11-04: qty 50

## 2016-11-04 MED ORDER — LACTATED RINGERS IV SOLN
INTRAVENOUS | Status: DC | PRN
  Administered 2016-11-04 (×2): via INTRAVENOUS

## 2016-11-04 MED ORDER — ATENOLOL 50 MG PO TABS
50.0000 mg | ORAL_TABLET | Freq: Once | ORAL | Status: AC
  Administered 2016-11-04: 50 mg via ORAL
  Filled 2016-11-04: qty 1

## 2016-11-04 MED ORDER — ACETAMINOPHEN 650 MG RE SUPP: 650.0000 mg | Freq: Four times a day (QID) | RECTAL | Status: DC | PRN

## 2016-11-04 MED ORDER — HYDROCODONE-ACETAMINOPHEN 5-325 MG PO TABS
1.0000 | ORAL_TABLET | Freq: Four times a day (QID) | ORAL | Status: DC | PRN
  Administered 2016-11-04 (×2): 1 via ORAL
  Administered 2016-11-05 (×3): 2 via ORAL
  Administered 2016-11-05: 1 via ORAL
  Administered 2016-11-06: 2 via ORAL
  Filled 2016-11-04 (×2): qty 2
  Filled 2016-11-04 (×2): qty 1
  Filled 2016-11-04: qty 2
  Filled 2016-11-04 (×2): qty 1
  Filled 2016-11-04: qty 2

## 2016-11-04 MED ORDER — PHENYLEPHRINE 40 MCG/ML (10ML) SYRINGE FOR IV PUSH (FOR BLOOD PRESSURE SUPPORT)
PREFILLED_SYRINGE | INTRAVENOUS | Status: DC | PRN
  Administered 2016-11-04 (×2): 40 ug via INTRAVENOUS
  Administered 2016-11-04 (×2): 80 ug via INTRAVENOUS

## 2016-11-04 MED ORDER — NEOSTIGMINE METHYLSULFATE 5 MG/5ML IV SOSY
PREFILLED_SYRINGE | INTRAVENOUS | Status: AC
  Filled 2016-11-04: qty 5

## 2016-11-04 MED ORDER — METHOCARBAMOL 500 MG PO TABS
500.0000 mg | ORAL_TABLET | Freq: Four times a day (QID) | ORAL | Status: DC | PRN
  Administered 2016-11-04 – 2016-11-06 (×5): 500 mg via ORAL
  Filled 2016-11-04 (×5): qty 1

## 2016-11-04 MED ORDER — METFORMIN HCL 500 MG PO TABS: 1000.0000 mg | ORAL_TABLET | Freq: Two times a day (BID) | ORAL | Status: DC

## 2016-11-04 MED ORDER — INSULIN DETEMIR 100 UNIT/ML ~~LOC~~ SOLN
7.0000 [IU] | Freq: Every day | SUBCUTANEOUS | Status: DC
  Administered 2016-11-04: 7 [IU] via SUBCUTANEOUS
  Filled 2016-11-04: qty 0.07

## 2016-11-04 MED ORDER — LISINOPRIL 20 MG PO TABS
20.0000 mg | ORAL_TABLET | Freq: Every day | ORAL | Status: DC
  Administered 2016-11-04 – 2016-11-08 (×5): 20 mg via ORAL
  Filled 2016-11-04 (×5): qty 1

## 2016-11-04 MED ORDER — TRIAMTERENE-HCTZ 75-50 MG PO TABS
1.0000 | ORAL_TABLET | Freq: Every day | ORAL | Status: DC
  Administered 2016-11-05 – 2016-11-06 (×2): 1 via ORAL
  Filled 2016-11-04 (×3): qty 1

## 2016-11-04 MED ORDER — TRANEXAMIC ACID 1000 MG/10ML IV SOLN
1000.0000 mg | INTRAVENOUS | Status: AC
  Administered 2016-11-04: 1000 mg via INTRAVENOUS
  Filled 2016-11-04: qty 10

## 2016-11-04 MED ORDER — INSULIN ASPART 100 UNIT/ML ~~LOC~~ SOLN
0.0000 [IU] | SUBCUTANEOUS | Status: DC
  Administered 2016-11-04: 5 [IU] via SUBCUTANEOUS
  Administered 2016-11-04 – 2016-11-05 (×3): 3 [IU] via SUBCUTANEOUS
  Administered 2016-11-05: 2 [IU] via SUBCUTANEOUS

## 2016-11-04 MED ORDER — MORPHINE SULFATE (PF) 4 MG/ML IV SOLN
0.5000 mg | INTRAVENOUS | Status: DC | PRN
  Administered 2016-11-04 – 2016-11-05 (×5): 0.52 mg via INTRAVENOUS
  Filled 2016-11-04 (×5): qty 1

## 2016-11-04 MED ORDER — ONDANSETRON HCL 4 MG PO TABS: 4.0000 mg | ORAL_TABLET | Freq: Four times a day (QID) | ORAL | Status: DC | PRN

## 2016-11-04 MED ORDER — BUPIVACAINE HCL (PF) 0.5 % IJ SOLN
INTRAMUSCULAR | Status: AC
  Filled 2016-11-04: qty 30

## 2016-11-04 MED ORDER — SIMVASTATIN 20 MG PO TABS
20.0000 mg | ORAL_TABLET | Freq: Every day | ORAL | Status: DC
  Administered 2016-11-05 – 2016-11-08 (×4): 20 mg via ORAL
  Filled 2016-11-04 (×4): qty 1

## 2016-11-04 MED ORDER — HYDROCODONE-ACETAMINOPHEN 5-325 MG PO TABS: 1.0000 | ORAL_TABLET | Freq: Four times a day (QID) | ORAL | 0 refills | Status: DC | PRN

## 2016-11-04 MED ORDER — CEFAZOLIN SODIUM-DEXTROSE 2-4 GM/100ML-% IV SOLN
2.0000 g | INTRAVENOUS | Status: AC
  Administered 2016-11-04: 2 g via INTRAVENOUS
  Filled 2016-11-04: qty 100

## 2016-11-04 MED ORDER — ATENOLOL 50 MG PO TABS
50.0000 mg | ORAL_TABLET | Freq: Every day | ORAL | Status: DC
  Administered 2016-11-05 – 2016-11-08 (×4): 50 mg via ORAL
  Filled 2016-11-04 (×4): qty 1

## 2016-11-04 MED ORDER — 0.9 % SODIUM CHLORIDE (POUR BTL) OPTIME
TOPICAL | Status: DC | PRN
  Administered 2016-11-04: 1000 mL

## 2016-11-04 MED ORDER — ONDANSETRON HCL 4 MG/2ML IJ SOLN: 4.0000 mg | Freq: Four times a day (QID) | INTRAMUSCULAR | Status: DC | PRN

## 2016-11-04 MED ORDER — FENTANYL CITRATE (PF) 100 MCG/2ML IJ SOLN
INTRAMUSCULAR | Status: DC | PRN
Start: 2016-11-04 — End: 2016-11-04
  Administered 2016-11-04: 25 ug via INTRAVENOUS

## 2016-11-04 MED ORDER — METHOCARBAMOL 1000 MG/10ML IJ SOLN
500.0000 mg | Freq: Four times a day (QID) | INTRAVENOUS | Status: DC | PRN
  Filled 2016-11-04: qty 5

## 2016-11-04 SURGICAL SUPPLY — 59 items
APL SKNCLS STERI-STRIP NONHPOA (GAUZE/BANDAGES/DRESSINGS) ×1
BENZOIN TINCTURE PRP APPL 2/3 (GAUZE/BANDAGES/DRESSINGS) ×2 IMPLANT
BLADE CLIPPER SURG (BLADE) IMPLANT
BNDG COHESIVE 6X5 TAN STRL LF (GAUZE/BANDAGES/DRESSINGS) IMPLANT
BNDG GAUZE ELAST 4 BULKY (GAUZE/BANDAGES/DRESSINGS) IMPLANT
CAPT HIP HEMI 2 ×1 IMPLANT
CELLS DAT CNTRL 66122 CELL SVR (MISCELLANEOUS) ×1 IMPLANT
CLSR STERI-STRIP ANTIMIC 1/2X4 (GAUZE/BANDAGES/DRESSINGS) ×1 IMPLANT
COVER PERINEAL POST (MISCELLANEOUS) ×2 IMPLANT
COVER SURGICAL LIGHT HANDLE (MISCELLANEOUS) ×2 IMPLANT
DRAPE C-ARM 42X72 X-RAY (DRAPES) ×2 IMPLANT
DRAPE STERI IOBAN 125X83 (DRAPES) ×2 IMPLANT
DRAPE U-SHAPE 47X51 STRL (DRAPES) ×4 IMPLANT
DRSG AQUACEL AG ADV 3.5X10 (GAUZE/BANDAGES/DRESSINGS) ×2 IMPLANT
DURAPREP 26ML APPLICATOR (WOUND CARE) ×2 IMPLANT
ELECT BLADE 4.0 EZ CLEAN MEGAD (MISCELLANEOUS)
ELECT CAUTERY BLADE 6.4 (BLADE) ×2 IMPLANT
ELECT REM PT RETURN 9FT ADLT (ELECTROSURGICAL) ×2
ELECTRODE BLDE 4.0 EZ CLN MEGD (MISCELLANEOUS) IMPLANT
ELECTRODE REM PT RTRN 9FT ADLT (ELECTROSURGICAL) ×1 IMPLANT
FRAME EYE SHIELD (PROTECTIVE WEAR) ×1 IMPLANT
GLOVE BIOGEL PI IND STRL 8 (GLOVE) ×2 IMPLANT
GLOVE BIOGEL PI INDICATOR 8 (GLOVE) ×2
GLOVE ECLIPSE 7.5 STRL STRAW (GLOVE) ×4 IMPLANT
GOWN STRL REUS W/ TWL LRG LVL3 (GOWN DISPOSABLE) ×2 IMPLANT
GOWN STRL REUS W/ TWL XL LVL3 (GOWN DISPOSABLE) ×2 IMPLANT
GOWN STRL REUS W/TWL LRG LVL3 (GOWN DISPOSABLE) ×6
GOWN STRL REUS W/TWL XL LVL3 (GOWN DISPOSABLE) ×4
HOOD PEEL AWAY FACE SHEILD DIS (HOOD) ×4 IMPLANT
IMMOBILIZER KNEE 20 (SOFTGOODS) ×2
IMMOBILIZER KNEE 20 THIGH 36 (SOFTGOODS) IMPLANT
KIT BASIN OR (CUSTOM PROCEDURE TRAY) ×2 IMPLANT
KIT ROOM TURNOVER OR (KITS) ×2 IMPLANT
MANIFOLD NEPTUNE II (INSTRUMENTS) ×2 IMPLANT
NDL SPNL 22GX3.5 QUINCKE BK (NEEDLE) ×1 IMPLANT
NEEDLE 22X1 1/2 (OR ONLY) (NEEDLE) ×1 IMPLANT
NEEDLE SPNL 22GX3.5 QUINCKE BK (NEEDLE) ×2 IMPLANT
NS IRRIG 1000ML POUR BTL (IV SOLUTION) ×2 IMPLANT
PACK TOTAL JOINT (CUSTOM PROCEDURE TRAY) ×2 IMPLANT
PACK UNIVERSAL I (CUSTOM PROCEDURE TRAY) ×2 IMPLANT
PAD ARMBOARD 7.5X6 YLW CONV (MISCELLANEOUS) ×4 IMPLANT
RETRACTOR WND ALEXIS 18 MED (MISCELLANEOUS) IMPLANT
RTRCTR WOUND ALEXIS 18CM MED (MISCELLANEOUS) ×2
SAW OSC TIP CART 19.5X105X1.3 (SAW) ×2 IMPLANT
SPONGE LAP 18X18 X RAY DECT (DISPOSABLE) IMPLANT
STAPLER VISISTAT 35W (STAPLE) IMPLANT
SUT ETHIBOND NAB CT1 #1 30IN (SUTURE) ×4 IMPLANT
SUT MNCRL AB 3-0 PS2 18 (SUTURE) IMPLANT
SUT VIC AB 0 CT1 27 (SUTURE) ×4
SUT VIC AB 0 CT1 27XBRD ANBCTR (SUTURE) ×1 IMPLANT
SUT VIC AB 1 CT1 27 (SUTURE) ×4
SUT VIC AB 1 CT1 27XBRD ANBCTR (SUTURE) ×2 IMPLANT
SUT VIC AB 2-0 CT1 27 (SUTURE) ×2
SUT VIC AB 2-0 CT1 TAPERPNT 27 (SUTURE) ×1 IMPLANT
SYR 50ML LL SCALE MARK (SYRINGE) ×2 IMPLANT
TOWEL OR 17X24 6PK STRL BLUE (TOWEL DISPOSABLE) ×2 IMPLANT
TOWEL OR 17X26 10 PK STRL BLUE (TOWEL DISPOSABLE) ×2 IMPLANT
TRAY CATH 16FR W/PLASTIC CATH (SET/KITS/TRAYS/PACK) IMPLANT
TUBING BULK SUCTION (MISCELLANEOUS) ×1 IMPLANT

## 2016-11-04 NOTE — Transfer of Care (Signed)
Immediate Anesthesia Transfer of Care Note  Patient: Abigail Huang  Procedure(s) Performed: Procedure(s): ANTERIOR APPROACH HEMI HIP ARTHROPLASTY (Right)  Patient Location: PACU  Anesthesia Type:MAC and Regional  Level of Consciousness: awake, alert  and oriented  Airway & Oxygen Therapy: Patient Spontanous Breathing  Post-op Assessment: Report given to RN and Post -op Vital signs reviewed and stable  Post vital signs: Reviewed and stable  Last Vitals:  Vitals:   11/04/16 0026 11/04/16 0429  BP: (!) 164/74 (!) 152/59  Pulse: 81 70  Resp: 18 16  Temp: 36.7 C 37.2 C    Last Pain:  Vitals:   11/04/16 0850  TempSrc:   PainSc: 8          Complications: No apparent anesthesia complications

## 2016-11-04 NOTE — Progress Notes (Signed)
PROGRESS NOTE    Abigail Huang  ALP:[id number] DOB: 1930-10-31 DOA: 11/03/2016 PCP: Burnard Bunting, MD    Brief Narrative: Abigail Huang is a 83 y.o. female with a past medical history significant for HTN, IDDM, and CKD who presents with hip pain after a fall.  Assessment & Plan:   Principal Problem:   Closed displaced fracture of right femoral neck (HCC) Active Problems:   Type 2 diabetes mellitus with stage 4 chronic kidney disease, with long-term current use of insulin (HCC)   Hyponatremia   Leukocytosis   Normocytic anemia   CKD (chronic kidney disease), stage IV (HCC)   Right hip fracture:  S/p rigth hip hemiarthroplasty.  Pain control.  PT evaluation in am.  Appreciate orthopedics consult and recommendations.    Type 2 DM:  CBG (last 3)   Recent Labs  11/04/16 0929 11/04/16 1057 11/04/16 1400  GLUCAP 78 96 99    Resume SSI.    Hypertension:  Well controlled.  Resume home meds , starting in am.     acute on Stage 3 CKD: Baseline creatinine is about 1.5. Creatinine on admission is 2 and improved to 1.8  Leukocytosis:  Probably reactive.  Resolved.    Mild normocytic anemia: probably anemia of chronic disease.   Hyponatremia: improved with hydration.  Monitor.     DVT prophylaxis: scd's Code Status: (Full) Family Communication: NONE at bedside.  Disposition Plan: pending further work up. With PT.    Consultants:   Orthopedics.    Procedures: right hip hemi arthroplasty. On 5/23   Antimicrobials:none.    Subjective: Comfortable.   Objective: Vitals:   11/04/16 1600 11/04/16 1611 11/04/16 1612 11/04/16 1640  BP:   (!) 157/62 135/64  Pulse: 73 78 77 71  Resp: 15 15 17    Temp:  97 F (36.1 C)  99.1 F (37.3 C)  TempSrc:    Oral  SpO2: 97% 97% 94% 95%  Weight:      Height:        Intake/Output Summary (Last 24 hours) at 11/04/16 1808 Last data filed at 11/04/16 1600  Gross per 24 hour  Intake          1236.67 ml    Output              700 ml  Net           536.67 ml   Filed Weights   11/04/16 0100  Weight: 87.4 kg (192 lb 10.9 oz)    Examination:  General exam: Appears calm and comfortable  Respiratory system: Clear to auscultation. Respiratory effort normal. Cardiovascular system: S1 & S2 heard, RRR. No JVD, murmurs, rubs, gallops or clicks. No pedal edema. Gastrointestinal system: Abdomen is nondistended, soft and nontender. No organomegaly or masses felt. Normal bowel sounds heard. Central nervous system: Alert and oriented. No focal neurological deficits. Extremities: right leg tender to touch at the hip area.     Data Reviewed: I have personally reviewed following labs and imaging studies  CBC:  Recent Labs Lab 11/03/16 1942 11/04/16 0751  WBC 17.8* 10.4  NEUTROABS 15.8*  --   HGB 11.6* 11.0*  HCT 35.3* 33.3*  MCV 89.1 88.6  PLT 288 353   Basic Metabolic Panel:  Recent Labs Lab 11/03/16 1942 11/04/16 0751  NA 130* 133*  K 4.8 4.3  CL 103 106  CO2 18* 20*  GLUCOSE 168* 79  BUN 39* 33*  CREATININE 2.02* 1.80*  CALCIUM 9.7 9.7  GFR: Estimated Creatinine Clearance: 25.9 mL/min (A) (by C-G formula based on SCr of 1.8 mg/dL (H)). Liver Function Tests: No results for input(s): AST, ALT, ALKPHOS, BILITOT, PROT, ALBUMIN in the last 168 hours. No results for input(s): LIPASE, AMYLASE in the last 168 hours. No results for input(s): AMMONIA in the last 168 hours. Coagulation Profile: No results for input(s): INR, PROTIME in the last 168 hours. Cardiac Enzymes: No results for input(s): CKTOTAL, CKMB, CKMBINDEX, TROPONINI in the last 168 hours. BNP (last 3 results) No results for input(s): PROBNP in the last 8760 hours. HbA1C: No results for input(s): HGBA1C in the last 72 hours. CBG:  Recent Labs Lab 11/04/16 0444 11/04/16 0545 11/04/16 0929 11/04/16 1057 11/04/16 1400  GLUCAP 66 93 78 96 99   Lipid Profile: No results for input(s): CHOL, HDL, LDLCALC, TRIG,  CHOLHDL, LDLDIRECT in the last 72 hours. Thyroid Function Tests: No results for input(s): TSH, T4TOTAL, FREET4, T3FREE, THYROIDAB in the last 72 hours. Anemia Panel:  Recent Labs  11/04/16 0138  VITAMINB12 281  FERRITIN 53  TIBC 329  IRON 33  RETICCTPCT 0.9   Sepsis Labs: No results for input(s): PROCALCITON, LATICACIDVEN in the last 168 hours.  Recent Results (from the past 240 hour(s))  Surgical pcr screen     Status: None   Collection Time: 11/04/16  2:12 AM  Result Value Ref Range Status   MRSA, PCR NEGATIVE NEGATIVE Final   Staphylococcus aureus NEGATIVE NEGATIVE Final    Comment:        The Xpert SA Assay (FDA approved for NASAL specimens in patients over 48 years of age), is one component of a comprehensive surveillance program.  Test performance has been validated by [facility] for patients greater than or equal to 63 year old. It is not intended to diagnose infection nor to guide or monitor treatment.          Radiology Studies: Dg Chest 1 View  Result Date: 11/03/2016 CLINICAL DATA:  Fall in living room EXAM: CHEST 1 VIEW COMPARISON:  03/08/2012 FINDINGS: Mild atelectasis at the left base. No acute consolidation or effusion. Stable cardiomediastinal silhouette. No pneumothorax. IMPRESSION: Mild hypoventilatory changes. No radiographic evidence for acute cardiopulmonary abnormality Electronically Signed   By: Donavan Foil M.D.   On: 11/03/2016 21:12   Dg C-arm 1-60 Min  Result Date: 11/04/2016 CLINICAL DATA:  Right hip replacement EXAM: DG C-ARM 61-120 MIN; OPERATIVE RIGHT HIP WITH PELVIS COMPARISON:  Radiography from yesterday FINDINGS: Right hip bipolar hemiarthroplasty for treatment of femoral neck fracture. The prosthesis appears located there is no evidence of periprosthetic fracture. Remote left obturator ring fracture. IMPRESSION: Fluoroscopy for right hip hemiarthroplasty. Electronically Signed   By: Monte Fantasia M.D.   On: 11/04/2016 14:08    Dg Hip Operative Unilat W Or W/o Pelvis Right  Result Date: 11/04/2016 CLINICAL DATA:  Right hip replacement EXAM: DG C-ARM 61-120 MIN; OPERATIVE RIGHT HIP WITH PELVIS COMPARISON:  Radiography from yesterday FINDINGS: Right hip bipolar hemiarthroplasty for treatment of femoral neck fracture. The prosthesis appears located there is no evidence of periprosthetic fracture. Remote left obturator ring fracture. IMPRESSION: Fluoroscopy for right hip hemiarthroplasty. Electronically Signed   By: Monte Fantasia M.D.   On: 11/04/2016 14:08   Dg Hip Unilat W Or Wo Pelvis 2-3 Views Right  Result Date: 11/03/2016 CLINICAL DATA:  83 y/o F; status post fall with right leg deformity. EXAM: DG HIP (WITH OR WITHOUT PELVIS) 2-3V RIGHT COMPARISON:  None. FINDINGS: No hip  dislocation. Acute transcervical fracture of the right femoral neck with coxa vara angulation, and mild superior displacement of the femoral shaft. Chronic fracture deformity of the left inferior pubic ramus. Age-indeterminate fracture deformity of the left superior pubic ramus. Extensive vascular calcification. IMPRESSION: 1. Acute transcervical fracture of the right femoral neck with coxa vara angulation and mild superior displacement of the femoral shaft. 2. Chronic fracture deformity of the left inferior pubic ramus. 3. Age-indeterminate fracture deformity of the left superior pubic ramus. Electronically Signed   By: Kristine Garbe M.D.   On: 11/03/2016 21:13   Dg Femur Min 2 Views Right  Result Date: 11/03/2016 CLINICAL DATA:  83 y/o F; status post fall with right leg deformity. EXAM: RIGHT FEMUR 2 VIEWS COMPARISON:  None. FINDINGS: No hip dislocation. Acute transcervical fracture of the right femoral neck with coxa vara angulation, and mild superior displacement of the femoral shaft. Extensive vascular calcification. Knee joint is grossly maintained. Superior patellar enthesophyte. IMPRESSION: Acute transcervical fracture of the right  femoral neck with coxa vara angulation and mild superior displacement of the femoral shaft. Electronically Signed   By: Kristine Garbe M.D.   On: 11/03/2016 21:14        Scheduled Meds: . aspirin EC  325 mg Oral BID PC  . atenolol  50 mg Oral Daily  . docusate sodium  100 mg Oral BID  . [START ON 11/05/2016] feeding supplement (GLUCERNA SHAKE)  237 mL Oral BID BM  . [START ON 11/05/2016] glipiZIDE  10 mg Oral Q breakfast  . insulin aspart  0-9 Units Subcutaneous Q4H  . [START ON 11/05/2016] insulin detemir  7 Units Subcutaneous QHS  . lisinopril  20 mg Oral Daily  . nortriptyline  25 mg Oral Daily  . simvastatin  20 mg Oral Daily  . triamterene-hydrochlorothiazide  1 tablet Oral Daily   Continuous Infusions: . sodium chloride 75 mL/hr at 11/04/16 1658  .  ceFAZolin (ANCEF) IV    . methocarbamol (ROBAXIN)  IV    . tranexamic acid       LOS: 1 day    Time spent: 35 minutes.     Hosie Poisson, MD [facility] Pager [phone]  If 7PM-7AM, please contact night-coverage [email or web address] Password [facility] 11/04/2016, 6:08 PM

## 2016-11-04 NOTE — Brief Op Note (Signed)
11/03/2016 - 11/04/2016  1:32 PM  PATIENT:  Abigail Huang  83 y.o. female  PRE-OPERATIVE DIAGNOSIS:  right hip femoral neck fracture  POST-OPERATIVE DIAGNOSIS:  right hip femoral neck fracture  PROCEDURE:  Procedure(s): ANTERIOR APPROACH HEMI HIP ARTHROPLASTY (Right)  SURGEON:  Surgeon(s) and Role:    Dorna Leitz, MD - Primary  PHYSICIAN ASSISTANT:   ASSISTANTS: bethune   ANESTHESIA:   spinal  EBL:  Total I/O In: 1000 [I.V.:1000] Out: 300 [Blood:300]  BLOOD ADMINISTERED:none  DRAINS: none   LOCAL MEDICATIONS USED:  MARCAINE    and OTHER experel  SPECIMEN:  No Specimen  DISPOSITION OF SPECIMEN:  N/A  COUNTS:  YES  TOURNIQUET:  * No tourniquets in log *  DICTATION: .Other Dictation: Dictation Number [id number]  PLAN OF CARE: Admit to inpatient   PATIENT DISPOSITION:  PACU - hemodynamically stable.   Delay start of Pharmacological VTE agent (>24hrs) due to surgical blood loss or risk of bleeding: no

## 2016-11-04 NOTE — Anesthesia Postprocedure Evaluation (Signed)
Anesthesia Post Note  Patient: Abigail Huang  Procedure(s) Performed: Procedure(s) (LRB): ANTERIOR APPROACH HEMI HIP ARTHROPLASTY (Right)  Patient location during evaluation: PACU Anesthesia Type: Spinal Level of consciousness: oriented and awake and alert Pain management: pain level controlled Vital Signs Assessment: post-procedure vital signs reviewed and stable Respiratory status: spontaneous breathing, respiratory function stable and patient connected to nasal cannula oxygen Cardiovascular status: blood pressure returned to baseline and stable Postop Assessment: no headache and no backache Anesthetic complications: no       Last Vitals:  Vitals:   11/04/16 1430 11/04/16 1445  BP:    Pulse: 62 73  Resp: 12 14  Temp:      Last Pain:  Vitals:   11/04/16 0850  TempSrc:   PainSc: 8                  Ryan P Ellender

## 2016-11-04 NOTE — Progress Notes (Signed)
Inpatient Diabetes Program Recommendations  AACE/ADA: New Consensus Statement on Inpatient Glycemic Control (2015)  Target Ranges:  Prepandial:   less than 140 mg/dL      Peak postprandial:   less than 180 mg/dL (1-2 hours)      Critically ill patients:  140 - 180 mg/dL   Results for NALLELY, YOST (MRN [id number]) as of 11/04/2016 11:55  Ref. Range 11/04/2016 00:19 11/04/2016 04:44 11/04/2016 05:45 11/04/2016 09:29 11/04/2016 10:57  Glucose-Capillary Latest Ref Range: 65 - 99 mg/dL 211 (H) 66 93 78 96   Review of Glycemic Control  Diabetes history: DM2 Outpatient Diabetes medications: Levemir 10 units daily, Glipizide 10 mg QAM, Metformin 1000 mg BID Current orders for Inpatient glycemic control: Levemir 7 units QHS, Novolog 0-9 units Q4H  Inpatient Diabetes Program Recommendations: Insulin - Basal: Noted glucose of 66 mg/dl this morning at 4:44 am. In reviewing chart, noted per home medication list patient took Levemir 10 units at home on 11/03/16. Patient received Levemir 7 units last night here at the hospital. Hypoglycemia likely due to combination of outpatient DM medications taken yesterday and patient receiving Levemir last night at bedtime.   Thanks, Barnie Alderman, RN, MSN, CDE Diabetes Coordinator Inpatient Diabetes Program [phone] (Team Pager from 8am to 5pm)

## 2016-11-04 NOTE — Anesthesia Preprocedure Evaluation (Addendum)
Anesthesia Evaluation  Patient identified by MRN, date of birth, ID band Patient awake    Reviewed: Allergy & Precautions, NPO status , Patient's Chart, lab work & pertinent test results  Airway Mallampati: II  TM Distance: >3 FB Neck ROM: Full    Dental no notable dental hx. (+) Edentulous Upper, Edentulous Lower, Dental Advisory Given   Pulmonary former smoker,    Pulmonary exam normal breath sounds clear to auscultation       Cardiovascular Exercise Tolerance: Good hypertension, Pt. on medications and Pt. on home beta blockers Normal cardiovascular exam Rhythm:Regular Rate:Normal  ECG: NSR, rate 69   Neuro/Psych negative neurological ROS  negative psych ROS   GI/Hepatic negative GI ROS, Neg liver ROS,   Endo/Other  diabetes, Well Controlled, Insulin Dependent  Renal/GU Renal diseaseCKD  negative genitourinary   Musculoskeletal negative musculoskeletal ROS (+)   Abdominal   Peds negative pediatric ROS (+)  Hematology  (+) anemia ,   Anesthesia Other Findings Obese, BMI: 32 Hyperlipidemia  Reproductive/Obstetrics negative OB ROS                           Anesthesia Physical Anesthesia Plan  ASA: III  Anesthesia Plan: Spinal   Post-op Pain Management:    Induction: Intravenous  Airway Management Planned: Simple Face Mask  Additional Equipment:   Intra-op Plan:   Post-operative Plan:   Informed Consent: I have reviewed the patients History and Physical, chart, labs and discussed the procedure including the risks, benefits and alternatives for the proposed anesthesia with the patient or authorized representative who has indicated his/her understanding and acceptance.   Dental advisory given  Plan Discussed with: CRNA and Surgeon  Anesthesia Plan Comments:         Anesthesia Quick Evaluation

## 2016-11-04 NOTE — Anesthesia Procedure Notes (Signed)
Spinal  Patient location during procedure: OR Start time: 11/04/2016 11:45 AM End time: 11/04/2016 11:55 AM Staffing Anesthesiologist: Adele Barthel P Performed: anesthesiologist  Preanesthetic Checklist Completed: patient identified, surgical consent, pre-op evaluation, timeout performed, IV checked, risks and benefits discussed and monitors and equipment checked Spinal Block Patient position: right lateral decubitus Prep: DuraPrep Patient monitoring: cardiac monitor, continuous pulse ox and blood pressure Approach: midline Location: L3-4 Injection technique: single-shot Needle Needle type: Quincke  Needle gauge: 22 G Needle length: 9 cm Assessment Sensory level: T10 Additional Notes Functioning IV was confirmed and monitors were applied. Sterile prep and drape, including hand hygiene and sterile gloves were used. The patient was positioned and the spine was prepped. The skin was anesthetized with lidocaine.  Free flow of clear CSF was obtained prior to injecting local anesthetic into the CSF.  The spinal needle aspirated freely following injection.  The needle was carefully withdrawn.  The patient tolerated the procedure well.

## 2016-11-04 NOTE — Discharge Instructions (Signed)

## 2016-11-04 NOTE — Progress Notes (Signed)
Initial Nutrition Assessment  DOCUMENTATION CODES:   Obesity unspecified  INTERVENTION:  Once diet advances, provide Glucerna Shake po BID, each supplement provides 220 kcal and 10 grams of protein.  NUTRITION DIAGNOSIS:   Increased nutrient needs related to  (post op healing) as evidenced by estimated needs.  GOAL:   Patient will meet greater than or equal to 90% of their needs  MONITOR:   Diet advancement, Skin, Weight trends, Supplement acceptance, Labs, I & O's  REASON FOR ASSESSMENT:   Consult Hip fracture protocol  ASSESSMENT:   83 y.o. female with a past medical history significant for HTN, IDDM, and CKD who presents with hip pain after a fall.  Pt is currently unavailable, in OR, during time of visit. Per MD note, pt was in normal state of health prior to fall and hip pain. RD to order nutritional supplements to aid in post op healing once diet advances.  Unable to complete Nutrition-Focused physical exam at this time.   Labs and medications reviewed.   Diet Order:  Diet NPO time specified Except for: Sips with Meds Diet NPO time specified Except for: Sips with Meds  Skin:  Reviewed, no issues  Last BM:  Unknown  Height:   Ht Readings from Last 1 Encounters:  11/04/16 5\' 5"  (1.651 m)    Weight:   Wt Readings from Last 1 Encounters:  11/04/16 192 lb 10.9 oz (87.4 kg)    Ideal Body Weight:  56.8 kg  BMI:  Body mass index is 32.06 kg/m.  Estimated Nutritional Needs:   Kcal:  1700-1850  Protein:  75-85 grams  Fluid:  1.7 - 1.9 L/day  EDUCATION NEEDS:   No education needs identified at this time  Corrin Parker, MS, RD, LDN Pager # [phone] After hours/ weekend pager # [phone]

## 2016-11-04 NOTE — Anesthesia Procedure Notes (Signed)
Procedure Name: MAC Date/Time: 11/04/2016 11:50 AM Performed by: Garrison [place] T Pre-anesthesia Checklist: Patient identified, Emergency Drugs available, Suction available and Patient being monitored Patient Re-evaluated:Patient Re-evaluated prior to inductionOxygen Delivery Method: Simple face mask Preoxygenation: Pre-oxygenation with 100% oxygen Intubation Type: IV induction Placement Confirmation: positive ETCO2 and breath sounds checked- equal and bilateral

## 2016-11-04 NOTE — Progress Notes (Signed)
PHARMACIST - PHYSICIAN COMMUNICATION Gary Fleet, PA-C CONCERNING:  METFORMIN SAFE ADMINISTRATION POLICY  RECOMMENDATION: Metformin has been placed on DISCONTINUE (rejected order) STATUS and should be reordered only after any of the conditions below are ruled out.  Current Safety recommendations include avoiding metformin for a minimum of 48 hours after the patient's exposure to intravenous contrast media for the following conditions:  . eGFR < 60 ml/min  . Liver disease, alcoholism, heart failure, intra-arterial administration of contrast  DESCRIPTION:  The Pharmacy Committee has adopted a policy that restricts the use of metformin in hospitalized patients until all the contraindications to administration have been ruled out. Specific contraindications are: '[]'  Serum creatinine ? 1.5 for males '[x]'  Serum creatinine ? 1.4 for females, eGFR <30 '[]'  Shock, acute MI, sepsis, hypoxemia, dehydration '[]'  Planned administration of intravenous iodinated contrast media when eGFR < 27m/min, Liver Disease, alcoholism, heart failure or intra-arterial administration of contrast '[]'  Heart Failure patients with low EF '[]'  Acute or chronic metabolic acidosis (including DKA)   LReatha Harps RTom[facility] Center5/23/2018 4:35 PM

## 2016-11-05 ENCOUNTER — Encounter (HOSPITAL_COMMUNITY): Payer: Self-pay | Admitting: Orthopedic Surgery

## 2016-11-05 DIAGNOSIS — Z794 Long term (current) use of insulin: Secondary | ICD-10-CM

## 2016-11-05 DIAGNOSIS — E1122 Type 2 diabetes mellitus with diabetic chronic kidney disease: Secondary | ICD-10-CM

## 2016-11-05 DIAGNOSIS — D649 Anemia, unspecified: Secondary | ICD-10-CM

## 2016-11-05 DIAGNOSIS — D72829 Elevated white blood cell count, unspecified: Secondary | ICD-10-CM

## 2016-11-05 LAB — CBC
HEMATOCRIT: 31.2 % — AB (ref 36.0–46.0)
HEMOGLOBIN: 10 g/dL — AB (ref 12.0–15.0)
MCH: 28.7 pg (ref 26.0–34.0)
MCHC: 32.1 g/dL (ref 30.0–36.0)
MCV: 89.4 fL (ref 78.0–100.0)
PLATELETS: 247 10*3/uL (ref 150–400)
RBC: 3.49 MIL/uL — AB (ref 3.87–5.11)
RDW: 12.9 % (ref 11.5–15.5)
WBC: 9.3 10*3/uL (ref 4.0–10.5)

## 2016-11-05 LAB — BASIC METABOLIC PANEL
ANION GAP: 5 (ref 5–15)
BUN: 23 mg/dL — ABNORMAL HIGH (ref 6–20)
CHLORIDE: 103 mmol/L (ref 101–111)
CO2: 23 mmol/L (ref 22–32)
Calcium: 9.3 mg/dL (ref 8.9–10.3)
Creatinine, Ser: 1.85 mg/dL — ABNORMAL HIGH (ref 0.44–1.00)
GFR calc non Af Amer: 24 mL/min — ABNORMAL LOW (ref >60)
GFR, EST AFRICAN AMERICAN: 28 mL/min — AB (ref >60)
Glucose, Bld: 125 mg/dL — ABNORMAL HIGH (ref 65–99)
POTASSIUM: 4.7 mmol/L (ref 3.5–5.1)
SODIUM: 131 mmol/L — AB (ref 135–145)

## 2016-11-05 LAB — GLUCOSE, CAPILLARY
GLUCOSE-CAPILLARY: 242 mg/dL — AB (ref 65–99)
GLUCOSE-CAPILLARY: 229 mg/dL — AB (ref 65–99)
GLUCOSE-CAPILLARY: 238 mg/dL — AB (ref 65–99)
GLUCOSE-CAPILLARY: 169 mg/dL — AB (ref 65–99)
Glucose-Capillary: 112 mg/dL — ABNORMAL HIGH (ref 65–99)
Glucose-Capillary: 194 mg/dL — ABNORMAL HIGH (ref 65–99)

## 2016-11-05 MED ORDER — INSULIN ASPART 100 UNIT/ML ~~LOC~~ SOLN: 0.0000 [IU] | Freq: Every day | SUBCUTANEOUS | Status: DC

## 2016-11-05 MED ORDER — INSULIN ASPART 100 UNIT/ML ~~LOC~~ SOLN
0.0000 [IU] | Freq: Three times a day (TID) | SUBCUTANEOUS | Status: DC
  Administered 2016-11-05: 5 [IU] via SUBCUTANEOUS
  Administered 2016-11-06: 3 [IU] via SUBCUTANEOUS
  Administered 2016-11-06: 5 [IU] via SUBCUTANEOUS
  Administered 2016-11-06: 2 [IU] via SUBCUTANEOUS
  Administered 2016-11-07 – 2016-11-08 (×3): 5 [IU] via SUBCUTANEOUS

## 2016-11-05 NOTE — NC FL2 (Signed)
[place] LEVEL OF CARE SCREENING TOOL     IDENTIFICATION  Patient Name: Abigail Huang Birthdate: [birth year] Sex: female Admission Date (Current Location): 11/03/2016  [facility] and [place] Number:  Herbalist and Address:  [facility] [place]. [facility], [place] [address] [place], [place] [address]      Provider Number: [id number]  Attending Physician Name and Address:  Hosie Poisson, MD  Relative Name and Phone Number:       Current Level of Care: Hospital Recommended Level of Care: [name] Prior Approval Number:    Date Approved/Denied: 11/05/16 PASRR Number: [id number]  Discharge Plan: SNF    Current Diagnoses: Patient Active Problem List   Diagnosis Date Noted  . Closed displaced fracture of right femoral neck ([place]) 11/03/2016  . Type 2 diabetes mellitus with stage 4 chronic kidney disease, with long-term current use of insulin ([place]) 11/03/2016  . Hyponatremia 11/03/2016  . Leukocytosis 11/03/2016  . Normocytic anemia 11/03/2016  . CKD (chronic kidney disease), stage IV ([place]) 11/03/2016    Orientation RESPIRATION BLADDER Height & Weight     Self, Time, Situation, Place  Normal Indwelling catheter, Continent Weight: 192 lb 10.9 oz (87.4 kg) Height:  5\' 5"  (165.1 cm)  BEHAVIORAL SYMPTOMS/MOOD NEUROLOGICAL BOWEL NUTRITION STATUS      Continent Diet (See Dc Summary)  AMBULATORY STATUS COMMUNICATION OF NEEDS Skin   Extensive Assist Verbally Surgical wounds (Right Hip Closed incision, silver hydorfiber dressing)                       Personal Care Assistance Level of Assistance  Bathing, Feeding, Dressing Bathing Assistance: Limited assistance Feeding assistance: Independent Dressing Assistance: Limited assistance     Functional Limitations Info  Sight, Hearing, Speech Sight Info: Adequate Hearing Info: Adequate Speech Info: Adequate    SPECIAL CARE FACTORS FREQUENCY  PT (By licensed PT), OT (By licensed  OT)     PT Frequency: 5xweek OT Frequency: 5xweek            Contractures      Additional Factors Info  Code Status, Allergies, Insulin Sliding Scale Code Status Info: Full Allergies Info: NKA   Insulin Sliding Scale Info: 9 units every 4 hr; 7 units       Current Medications (11/05/2016):  This is the current hospital active medication list Current Facility-Administered Medications  Medication Dose Route Frequency Provider Last Rate Last Dose  . 0.9 %  sodium chloride infusion   Intravenous Continuous Gary Fleet, PA-C 75 mL/hr at 11/05/16 7622    . acetaminophen (TYLENOL) tablet 650 mg  650 mg Oral Q6H PRN Gary Fleet, PA-C       Or  . acetaminophen (TYLENOL) suppository 650 mg  650 mg Rectal Q6H PRN Gary Fleet, PA-C      . aspirin EC tablet 325 mg  325 mg Oral BID PC Gary Fleet, PA-C   325 mg at 11/05/16 0949  . atenolol (TENORMIN) tablet 50 mg  50 mg Oral Daily Edwin Dada, MD   50 mg at 11/05/16 0949  . bisacodyl (DULCOLAX) suppository 10 mg  10 mg Rectal Daily PRN Danford, Christopher P, MD      . dextrose 50 % solution 25 mL  25 mL Intravenous PRN Edwin Dada, MD   25 mL at 11/04/16 0519  . docusate sodium (COLACE) capsule 100 mg  100 mg Oral BID Danford, Suann Larry, MD   100 mg at  11/05/16 0949  . feeding supplement (GLUCERNA SHAKE) (GLUCERNA SHAKE) liquid 237 mL  237 mL Oral BID BM Hosie Poisson, MD   237 mL at 11/05/16 0958  . glipiZIDE (GLUCOTROL XL) 24 hr tablet 10 mg  10 mg Oral Q breakfast Gary Fleet, PA-C   10 mg at 11/05/16 0950  . HYDROcodone-acetaminophen (NORCO/VICODIN) 5-325 MG per tablet 1-2 tablet  1-2 tablet Oral Q6H PRN Edwin Dada, MD   2 tablet at 11/05/16 1203  . insulin aspart (novoLOG) injection 0-9 Units  0-9 Units Subcutaneous Q4H Edwin Dada, MD   3 Units at 11/05/16 0950  . insulin detemir (LEVEMIR) injection 7 Units  7 Units Subcutaneous QHS Gary Fleet, PA-C      . lisinopril  (PRINIVIL,ZESTRIL) tablet 20 mg  20 mg Oral Daily Gary Fleet, PA-C   20 mg at 11/05/16 0949  . methocarbamol (ROBAXIN) tablet 500 mg  500 mg Oral Q6H PRN Gary Fleet, PA-C   500 mg at 11/05/16 3212   Or  . methocarbamol (ROBAXIN) 500 mg in dextrose 5 % 50 mL IVPB  500 mg Intravenous Q6H PRN Gary Fleet, PA-C      . morphine 4 MG/ML injection 0.52 mg  0.52 mg Intravenous Q2H PRN Edwin Dada, MD   0.52 mg at 11/05/16 0447  . nortriptyline (PAMELOR) capsule 25 mg  25 mg Oral Daily Edwin Dada, MD   25 mg at 11/05/16 0949  . ondansetron (ZOFRAN) tablet 4 mg  4 mg Oral Q6H PRN Gary Fleet, PA-C       Or  . ondansetron [facility]) injection 4 mg  4 mg Intravenous Q6H PRN Gary Fleet, PA-C      . polyethylene glycol (MIRALAX / GLYCOLAX) packet 17 g  17 g Oral Daily PRN Danford, Suann Larry, MD      . simvastatin (ZOCOR) tablet 20 mg  20 mg Oral Daily Edwin Dada, MD   20 mg at 11/05/16 0949  . triamterene-hydrochlorothiazide (MAXZIDE) 75-50 MG per tablet 1 tablet  1 tablet Oral Daily Danford, Suann Larry, MD   1 tablet at 11/05/16 2482     Discharge Medications: Please see discharge summary for a list of discharge medications.  Relevant Imaging Results:  Relevant Lab Results:   Additional Information NO:[id number]  Normajean Baxter, LCSW

## 2016-11-05 NOTE — Evaluation (Signed)
Physical Therapy Evaluation Patient Details Name: Abigail Huang MRN: [id number] DOB: [birth year] Today's Date: 11/05/2016   History of Present Illness  Pt admit after fall with right direct THA anterior approach.  PMH: HTN,DM  Clinical Impression  Pt admitted with above diagnosis. Pt currently with functional limitations due to the deficits listed below (see PT Problem List). Pt was able to ambulate in room with +2 min to mod assist.  Needs cues and assist. Will need SNF.   Pt will benefit from skilled PT to increase their independence and safety with mobility to allow discharge to the venue listed below.      Follow Up Recommendations SNF;Supervision/Assistance - 24 hour    Equipment Recommendations  Other (comment) (TBA at next venue)    Recommendations for Other Services       Precautions / Restrictions Precautions Precautions: Fall Precaution Comments: no hip precautions Restrictions Weight Bearing Restrictions: Yes RLE Weight Bearing: Weight bearing as tolerated      Mobility  Bed Mobility Overal bed mobility: Needs Assistance Bed Mobility: Supine to Sit     Supine to sit: Mod assist;+2 for physical assistance     General bed mobility comments: Needed assist for LEs and for elevation of trunk  Transfers Overall transfer level: Needs assistance Equipment used: Rolling walker (2 wheeled) Transfers: Sit to/from Stand Sit to Stand: +2 physical assistance;Max assist;From elevated surface         General transfer comment: Pt was max assist of 2 to stand to RW due to posterior lean by pt.  Pt needed incr assist until she was able to get feet placed and still needed min to mod assist for stacbility.   Ambulation/Gait Ambulation/Gait assistance: Mod assist;+2 physical assistance;Min assist Ambulation Distance (Feet): 15 Feet Assistive device: Rolling walker (2 wheeled) Gait Pattern/deviations: Decreased step length - right;Decreased stance time - right;Decreased  weight shift to right;Step-to pattern;Decreased stride length;Antalgic;Leaning posteriorly   Gait velocity interpretation: Below normal speed for age/gender General Gait Details: Pt was able to ambulate with cues for sequencing steps and RW.  Pt needed constant cues and occasionally needed asssist due to posterior lean.    Stairs            Wheelchair Mobility    Modified Rankin (Stroke Patients Only)       Balance Overall balance assessment: Needs assistance;History of Falls Sitting-balance support: Feet supported;Bilateral upper extremity supported Sitting balance-Leahy Scale: Poor Sitting balance - Comments: Needed UE support.   Postural control: Posterior lean Standing balance support: Bilateral upper extremity supported;During functional activity Standing balance-Leahy Scale: Poor Standing balance comment: Posterior lean needing mod assist with RW.                              Pertinent Vitals/Pain Pain Assessment: 0-10 Pain Score: 7  Pain Location: right hip Pain Descriptors / Indicators: Aching;Grimacing;Guarding Pain Intervention(s): Limited activity within patient's tolerance;Monitored during session;Premedicated before session;Repositioned    Home Living Family/patient expects to be discharged to:: Private residence Living Arrangements: Spouse/significant other Available Help at Discharge: Family;Available PRN/intermittently Type of Home: House Home Access: Stairs to enter Entrance Stairs-Rails: Left Entrance Stairs-Number of Steps: 1 Home Layout: One level Home Equipment: Walker - 2 wheels;Cane - single point;Bedside commode;Grab bars - tub/shower      Prior Function Level of Independence: Independent               Hand Dominance   Dominant Hand: Right  Extremity/Trunk Assessment   Upper Extremity Assessment Upper Extremity Assessment: Defer to OT evaluation    Lower Extremity Assessment Lower Extremity Assessment: RLE  deficits/detail RLE Deficits / Details: grossly 2-/5    Cervical / Trunk Assessment Cervical / Trunk Assessment: Normal  Communication   Communication: No difficulties  Cognition Arousal/Alertness: Awake/alert Behavior During Therapy: Anxious Overall Cognitive Status: Within Functional Limits for tasks assessed                                        General Comments      Exercises General Exercises - Lower Extremity Ankle Circles/Pumps: AROM;Both;10 reps;Supine Quad Sets: AROM;Both;10 reps;Supine Gluteal Sets: AROM;Both;10 reps;Supine Heel Slides: AAROM;Both;5 reps;Supine   Assessment/Plan    PT Assessment Patient needs continued PT services  PT Problem List Decreased strength;Decreased range of motion;Decreased activity tolerance;Decreased balance;Decreased mobility;Decreased knowledge of use of DME;Decreased safety awareness;Decreased knowledge of precautions;Pain       PT Treatment Interventions DME instruction;Gait training;Stair training;Functional mobility training;Therapeutic activities;Therapeutic exercise;Balance training;Patient/family education    PT Goals (Current goals can be found in the Care Plan section)  Acute Rehab PT Goals Patient Stated Goal: to go home PT Goal Formulation: With patient Time For Goal Achievement: 11/19/16 Potential to Achieve Goals: Good    Frequency 7X/week   Barriers to discharge Decreased caregiver support      Co-evaluation PT/OT/SLP Co-Evaluation/Treatment: Yes Reason for Co-Treatment: Complexity of the patient's impairments (multi-system involvement) PT goals addressed during session: Mobility/safety with mobility         AM-PAC PT "6 Clicks" Daily Activity  Outcome Measure Difficulty turning over in bed (including adjusting bedclothes, sheets and blankets)?: Total Difficulty moving from lying on back to sitting on the side of the bed? : Total Difficulty sitting down on and standing up from a chair  with arms (e.g., wheelchair, bedside commode, etc,.)?: Total Help needed moving to and from a bed to chair (including a wheelchair)?: Total Help needed walking in hospital room?: A Lot Help needed climbing 3-5 steps with a railing? : Total 6 Click Score: 7    End of Session Equipment Utilized During Treatment: Gait belt Activity Tolerance: Patient limited by fatigue;Patient limited by pain Patient left: in chair;with call bell/phone within reach;with chair alarm set;with family/visitor present Nurse Communication: Mobility status PT Visit Diagnosis: Unsteadiness on feet (R26.81);Muscle weakness (generalized) (M62.81);Other (comment);Pain Pain - Right/Left: Right Pain - part of body: Hip    Time: 1210-1235 PT Time Calculation (min) (ACUTE ONLY): 25 min   Charges:   PT Evaluation $PT Eval Moderate Complexity: 1 Procedure     PT G Codes:        Barbera Perritt,PT Acute Rehabilitation [phone] (pager)   Denice Paradise 11/05/2016, 2:36 PM

## 2016-11-05 NOTE — Clinical Social Work Note (Signed)
Clinical Social Work Assessment  Patient Details  Name: Abigail Huang MRN: [id number] Date of Birth: [birth year]  Date of referral:  11/05/16               Reason for consult:  Facility Placement                Permission sought to share information with:  Facility Art therapist granted to share information::  Yes, Verbal Permission Granted  Name::        Agency::  SNF  Relationship::  daughters  Contact Information:     Housing/Transportation Living arrangements for the past 2 months:  Single Family Home Source of Information:  Adult Children Patient Interpreter Needed:  None Criminal Activity/Legal Involvement Pertinent to Current Situation/Hospitalization:  No - Comment as needed Significant Relationships:  Adult Children Lives with:  Self Do you feel safe going back to the place where you live?  No Need for family participation in patient care:  Yes (Comment)  Care giving concerns:  Patient resided alone prior to hospitalization. Patient not safe to return home at this time.  Social Worker assessment / plan:  CSW met with patient and daughter at bedside to discuss recommendations for DC to SNF. CSW discsused her role and SNF options/placement.  CSW explained the insurance auth and the SNF would have to obtain. Patient and family authorize CSW to send offer to [facility].  Family has experience with SNF and has gone to [facility] in the past. Family already contacted [facility] who indicated that they have private room available. CSW  Complete FL2 and passr. Offer sent to [facility].  Employment status:  Retired Nurse, adult PT Recommendations:  [place] / Referral to community resources:  [place]  Patient/Family's Response to care:  Patient and family are appreciative with care and assistance from [place]. No issues or concerns identified at this  time.  Patient/Family's Understanding of and Emotional Response to Diagnosis, Current Treatment, and Prognosis:  Patient and family has good understanding of diagnosis, current treatment and prognosis. They are hopeful SNF will address impairment. No issues identified at this time.  Emotional Assessment Appearance:    Attitude/Demeanor/Rapport:   (Cooperative) Affect (typically observed):  Accepting, Appropriate Orientation:  Oriented to Self, Oriented to Place, Oriented to  Time, Oriented to Situation Alcohol / Substance use:  Not Applicable Psych involvement (Current and /or in the community):  No (Comment)  Discharge Needs  Concerns to be addressed:  Care Coordination Readmission within the last 30 days:  No Current discharge risk:  Physical Impairment, Dependent with Mobility Barriers to Discharge:  No Barriers Identified   Normajean Baxter, LCSW 11/05/2016, 3:06 PM

## 2016-11-05 NOTE — Progress Notes (Signed)
Results for TIEGAN, JAMBOR (MRN [id number]) as of 11/05/2016 15:03  Ref. Range 11/04/2016 21:11 11/05/2016 00:09 11/05/2016 03:44 11/05/2016 09:41 11/05/2016 12:02  Glucose-Capillary Latest Ref Range: 65 - 99 mg/dL 317 (H) 194 (H) 112 (H) 242 (H) 229 (H)  Noted that blood sugars continue to be greater than 180 mg/dl.  Recommend increasing Lantus to 10 units daily if blood sugars continue to be elevated. Titrate dosage as needed.    Harvel Ricks RN BSN CDE Diabetes Coordinator Pager: [phone]  8am-5pm

## 2016-11-05 NOTE — Progress Notes (Signed)
PROGRESS NOTE    Abigail Huang  XVQ:[id number] DOB: [birth year] DOA: 11/03/2016 PCP: Burnard Bunting, MD    Brief Narrative: Abigail Huang is a 83 y.o. female with a past medical history significant for HTN, IDDM, and CKD who presents with hip pain after a fall.  Assessment & Plan:   Principal Problem:   Closed displaced fracture of right femoral neck (HCC) Active Problems:   Type 2 diabetes mellitus with stage 4 chronic kidney disease, with long-term current use of insulin (HCC)   Hyponatremia   Leukocytosis   Normocytic anemia   CKD (chronic kidney disease), stage IV (HCC)   Right hip fracture:  S/p rigth hip hemiarthroplasty.  Pain control.  PT evaluation recommended SNF.  Appreciate orthopedics consult and recommendations.    Type 2 DM:  CBG (last 3)   Recent Labs  11/05/16 0344 11/05/16 0941 11/05/16 1202  GLUCAP 112* 242* 229*    Resume SSI. CBGS slightly high, will add premeal coverage.    Hypertension:  Well controlled.  Resume home meds , starting in am.     acute on Stage 3 CKD: Baseline creatinine is about 1.5. Creatinine on admission is 2 and improved to 1.8, stable at 1.8. Discussed the renal function with the patient and her daughter at bedside.   Leukocytosis:  Probably reactive.  Resolved.    Mild normocytic anemia/ anemia of blood loss from the surgery. With a component of anemia of chronic disease.  Transfuse when hemoglobin drops to less than 7. Currently at 10 today.    Hyponatremia: improved with hydration.  Monitor.     DVT prophylaxis: scd's Code Status: (Full) Family Communication: NONE at bedside.  Disposition Plan: SNF in 1 to 2 days.    Consultants:   Orthopedics.    Procedures: right hip hemi arthroplasty. On 5/23   Antimicrobials:none.    Subjective: Slightly painful on touch and movement of the right hip. .   Objective: Vitals:   11/04/16 1640 11/04/16 2122 11/05/16 0500 11/05/16 1427  BP: 135/64  (!) 146/59 (!) 142/60 136/62  Pulse: 71 87 80 68  Resp:  17 17 17   Temp: 99.1 F (37.3 C) 99.2 F (37.3 C) 99.3 F (37.4 C) 97.9 F (36.6 C)  TempSrc: Oral Oral Oral Oral  SpO2: 95% 97% 95% 95%  Weight:      Height:        Intake/Output Summary (Last 24 hours) at 11/05/16 1525 Last data filed at 11/05/16 1426  Gross per 24 hour  Intake             1755 ml  Output             2525 ml  Net             -770 ml   Filed Weights   11/04/16 0100  Weight: 87.4 kg (192 lb 10.9 oz)    Examination:  General exam: Appears calm and comfortable  Respiratory system: Clear to auscultation. Respiratory effort normal. Cardiovascular system: S1 & S2 heard, RRR. No JVD, murmurs, rubs, gallops or clicks. No pedal edema. Gastrointestinal system: Abdomen is nondistended, soft and nontender. No organomegaly or masses felt. Normal bowel sounds heard. Central nervous system: Alert and oriented. No focal neurological deficits. Extremities: right leg tender to touch at the hip area.     Data Reviewed: I have personally reviewed following labs and imaging studies  CBC:  Recent Labs Lab 11/03/16 1942 11/04/16 0751 11/05/16 0400  WBC 17.8*  10.4 9.3  NEUTROABS 15.8*  --   --   HGB 11.6* 11.0* 10.0*  HCT 35.3* 33.3* 31.2*  MCV 89.1 88.6 89.4  PLT 288 276 831   Basic Metabolic Panel:  Recent Labs Lab 11/03/16 1942 11/04/16 0751 11/05/16 0400  NA 130* 133* 131*  K 4.8 4.3 4.7  CL 103 106 103  CO2 18* 20* 23  GLUCOSE 168* 79 125*  BUN 39* 33* 23*  CREATININE 2.02* 1.80* 1.85*  CALCIUM 9.7 9.7 9.3   GFR: Estimated Creatinine Clearance: 25.2 mL/min (A) (by C-G formula based on SCr of 1.85 mg/dL (H)). Liver Function Tests: No results for input(s): AST, ALT, ALKPHOS, BILITOT, PROT, ALBUMIN in the last 168 hours. No results for input(s): LIPASE, AMYLASE in the last 168 hours. No results for input(s): AMMONIA in the last 168 hours. Coagulation Profile: No results for input(s): INR,  PROTIME in the last 168 hours. Cardiac Enzymes: No results for input(s): CKTOTAL, CKMB, CKMBINDEX, TROPONINI in the last 168 hours. BNP (last 3 results) No results for input(s): PROBNP in the last 8760 hours. HbA1C: No results for input(s): HGBA1C in the last 72 hours. CBG:  Recent Labs Lab 11/04/16 2111 11/05/16 0009 11/05/16 0344 11/05/16 0941 11/05/16 1202  GLUCAP 317* 194* 112* 242* 229*   Lipid Profile: No results for input(s): CHOL, HDL, LDLCALC, TRIG, CHOLHDL, LDLDIRECT in the last 72 hours. Thyroid Function Tests: No results for input(s): TSH, T4TOTAL, FREET4, T3FREE, THYROIDAB in the last 72 hours. Anemia Panel:  Recent Labs  11/04/16 0138  VITAMINB12 281  FERRITIN 53  TIBC 329  IRON 33  RETICCTPCT 0.9   Sepsis Labs: No results for input(s): PROCALCITON, LATICACIDVEN in the last 168 hours.  Recent Results (from the past 240 hour(s))  Surgical pcr screen     Status: None   Collection Time: 11/04/16  2:12 AM  Result Value Ref Range Status   MRSA, PCR NEGATIVE NEGATIVE Final   Staphylococcus aureus NEGATIVE NEGATIVE Final    Comment:        The Xpert SA Assay (FDA approved for NASAL specimens in patients over 1 years of age), is one component of a comprehensive surveillance program.  Test performance has been validated by [facility] for patients greater than or equal to 7 year old. It is not intended to diagnose infection nor to guide or monitor treatment.          Radiology Studies: Dg Chest 1 View  Result Date: 11/03/2016 CLINICAL DATA:  Fall in living room EXAM: CHEST 1 VIEW COMPARISON:  03/08/2012 FINDINGS: Mild atelectasis at the left base. No acute consolidation or effusion. Stable cardiomediastinal silhouette. No pneumothorax. IMPRESSION: Mild hypoventilatory changes. No radiographic evidence for acute cardiopulmonary abnormality Electronically Signed   By: Donavan Foil M.D.   On: 11/03/2016 21:12   Dg C-arm 1-60 Min  Result Date:  11/04/2016 CLINICAL DATA:  Right hip replacement EXAM: DG C-ARM 61-120 MIN; OPERATIVE RIGHT HIP WITH PELVIS COMPARISON:  Radiography from yesterday FINDINGS: Right hip bipolar hemiarthroplasty for treatment of femoral neck fracture. The prosthesis appears located there is no evidence of periprosthetic fracture. Remote left obturator ring fracture. IMPRESSION: Fluoroscopy for right hip hemiarthroplasty. Electronically Signed   By: Monte Fantasia M.D.   On: 11/04/2016 14:08   Dg Hip Operative Unilat W Or W/o Pelvis Right  Result Date: 11/04/2016 CLINICAL DATA:  Right hip replacement EXAM: DG C-ARM 61-120 MIN; OPERATIVE RIGHT HIP WITH PELVIS COMPARISON:  Radiography from yesterday FINDINGS: Right hip  bipolar hemiarthroplasty for treatment of femoral neck fracture. The prosthesis appears located there is no evidence of periprosthetic fracture. Remote left obturator ring fracture. IMPRESSION: Fluoroscopy for right hip hemiarthroplasty. Electronically Signed   By: Monte Fantasia M.D.   On: 11/04/2016 14:08   Dg Hip Unilat W Or Wo Pelvis 2-3 Views Right  Result Date: 11/03/2016 CLINICAL DATA:  83 y/o F; status post fall with right leg deformity. EXAM: DG HIP (WITH OR WITHOUT PELVIS) 2-3V RIGHT COMPARISON:  None. FINDINGS: No hip dislocation. Acute transcervical fracture of the right femoral neck with coxa vara angulation, and mild superior displacement of the femoral shaft. Chronic fracture deformity of the left inferior pubic ramus. Age-indeterminate fracture deformity of the left superior pubic ramus. Extensive vascular calcification. IMPRESSION: 1. Acute transcervical fracture of the right femoral neck with coxa vara angulation and mild superior displacement of the femoral shaft. 2. Chronic fracture deformity of the left inferior pubic ramus. 3. Age-indeterminate fracture deformity of the left superior pubic ramus. Electronically Signed   By: Kristine Garbe M.D.   On: 11/03/2016 21:13   Dg Femur  Min 2 Views Right  Result Date: 11/03/2016 CLINICAL DATA:  83 y/o F; status post fall with right leg deformity. EXAM: RIGHT FEMUR 2 VIEWS COMPARISON:  None. FINDINGS: No hip dislocation. Acute transcervical fracture of the right femoral neck with coxa vara angulation, and mild superior displacement of the femoral shaft. Extensive vascular calcification. Knee joint is grossly maintained. Superior patellar enthesophyte. IMPRESSION: Acute transcervical fracture of the right femoral neck with coxa vara angulation and mild superior displacement of the femoral shaft. Electronically Signed   By: Kristine Garbe M.D.   On: 11/03/2016 21:14        Scheduled Meds: . aspirin EC  325 mg Oral BID PC  . atenolol  50 mg Oral Daily  . docusate sodium  100 mg Oral BID  . feeding supplement (GLUCERNA SHAKE)  237 mL Oral BID BM  . glipiZIDE  10 mg Oral Q breakfast  . insulin aspart  0-9 Units Subcutaneous Q4H  . insulin detemir  7 Units Subcutaneous QHS  . lisinopril  20 mg Oral Daily  . nortriptyline  25 mg Oral Daily  . simvastatin  20 mg Oral Daily  . triamterene-hydrochlorothiazide  1 tablet Oral Daily   Continuous Infusions: . sodium chloride 75 mL/hr at 11/05/16 0646  . methocarbamol (ROBAXIN)  IV       LOS: 2 days    Time spent: 35 minutes.     Hosie Poisson, MD [facility] Pager [phone]  If 7PM-7AM, please contact night-coverage [email or web address] Password [facility] 11/05/2016, 3:25 PM

## 2016-11-05 NOTE — Op Note (Signed)
NAME:  Abigail Huang, Abigail Huang NO.:  [id number]  MEDICAL RECORD NO.:  [id number]  LOCATION:                                 FACILITY:  PHYSICIAN:  Alta Corning, M.D.        DATE OF BIRTH:  DATE OF PROCEDURE:  11/04/2016 DATE OF DISCHARGE:                              OPERATIVE REPORT   She is an 83 year old female on the Medicine Service.  PREOPERATIVE DIAGNOSIS:  Displaced femoral neck fracture, right.  POSTOPERATIVE DIAGNOSIS:  Displaced femoral neck fracture.  PROCEDURE: 1. Right bipolar hemiarthroplasty with a Corail size 11 stem, 46 mm     bipolar head with a 28 mm internal ball. 2. Interpretation of multiple intraoperative fluoroscopic images.  SURGEON:  Alta Corning, M.D.  ASSISTANT:  Gary Fleet, P.A.  ANESTHESIA:  General.  BRIEF HISTORY:  Ms. Parco is an 83 year old female with a long history of complaints of right hip pain.  She had fallen at home and suffered a displaced femoral neck fracture.  We had evaluated her and felt that she needed hemiarthroplasty given her fairly active lifestyle and felt that an anterior approach hemiarthroplasty would be beneficial for her and she was brought to the operating room for this procedure.  DESCRIPTION OF PROCEDURE:  The patient was brought to the operative room and after adequate anesthesia was obtained with a spinal anesthetic, the patient was placed supine on the operating table.  She was moved onto the [place] bed with boots put in place.  Following this, the patient was prepped and draped in the usual sterile fashion and following this, incision made for an anterior approach to the hip, subcutaneous tissue down to the level of the tensor fascia, tensor fascia muscles identified and once this was done, its fascia was opened and the muscles finger fractured back to placement of the retractors, which were placed above and below the neck.  The capsule was then opened and tagged.  A provisional neck cut  is made and this portion of bone is removed as well as the femoral head sized on the back table to a 46.  A #46 trial ball was used and this gives excellent range of motion and stability, and that was removed.  At this point, the retractors were put in place behind the femur and the hip was externally rotated and put in a down and over position.  The canal was then opened and sequentially rasped to a level of 12.  We then did a trial reduction, which was pretty long at that point.  So we felt like we will need to go back to an 11, drop that down about 7 mm and calcar planed down to that level.  We then opened a size 11 stem, because there was excellent fit with this with good rotational control, and put in an #11 stem all the way down, and once that stem was all the way down, we then put the final 46 mm bipolar ball along with the shortest position and a reduction was undertaken. Excellent reduction was achieved at this point, the hip looked to be in excellent position and final images were taken at  this point to make sure that we were sized appropriately, and that the leg lengths were symmetric.  At this point, the wound was copiously irrigated and suctioned dry.  The anterior capsule was closed with #1 Vicryl running, the skin with 0 and 2-0 Vicryl, and 3-0 Monocryl subcuticular was applied.  Benzoin Steri-Strips were placed.  Of note, a 20 mL of Exparel with 30 mL Marcaine were instilled throughout and around the hip for postoperative anesthesia.  At this point, a sterile compressive dressing was applied and the patient was taken to the recovery room and was noted to be in satisfactory condition.  Estimated blood loss for the procedure was 300 mL.     Alta Corning, M.D.     Corliss Skains  D:  11/04/2016  T:  11/04/2016  Job:  [id number]

## 2016-11-05 NOTE — Progress Notes (Signed)
Subjective: 1 Day Post-Op Procedure(s) (LRB): ANTERIOR APPROACH HEMI HIP ARTHROPLASTY (Right) Patient reports pain as mild. Foley catheter still in place.  She will get it removed this morning.  Has not been out of bed yet.  Taking by mouth without difficulty.  No significant complaints.   Objective: Vital signs in last 24 hours: Temp:  [97 F (36.1 C)-99.3 F (37.4 C)] 99.3 F (37.4 C) (05/24 0500) Pulse Rate:  [61-89] 80 (05/24 0500) Resp:  [12-27] 17 (05/24 0500) BP: (110-167)/(51-71) 142/60 (05/24 0500) SpO2:  [94 %-100 %] 95 % (05/24 0500)  Intake/Output from previous day: 05/23 0701 - 05/24 0700 In: 2275 [P.O.:240; I.V.:2035] Out: 8978 [ERQSX:2820; Blood:300] Intake/Output this shift: No intake/output data recorded.   Recent Labs  11/03/16 1942 11/04/16 0751 11/05/16 0400  HGB 11.6* 11.0* 10.0*    Recent Labs  11/04/16 0751 11/05/16 0400  WBC 10.4 9.3  RBC 3.76* 3.49*  HCT 33.3* 31.2*  PLT 276 247    Recent Labs  11/04/16 0751 11/05/16 0400  NA 133* 131*  K 4.3 4.7  CL 106 103  CO2 20* 23  BUN 33* 23*  CREATININE 1.80* 1.85*  GLUCOSE 79 125*  CALCIUM 9.7 9.3   No results for input(s): LABPT, INR in the last 72 hours. Right hip exam: Neurovascular intact Sensation intact distally Intact pulses distally Dorsiflexion/Plantar flexion intact Incision: dressing C/D/I Compartment soft  Assessment/Plan: 1 Day Post-Op Procedure(s) (LRB): ANTERIOR APPROACH HEMI HIP ARTHROPLASTY (Right)  Plan: Aspirin 325 mg twice daily with food for DVT prophylaxis.  Treat times one month. Weight-bear as tolerated on the right with No hip precautions. May discontinue knee immobilizer. If does well with physical therapy can possibly be discharged home with home health PT.  If she struggles with PT may need skilled nursing facility. Up with therapy  Shermon Bozzi G 11/05/2016, 9:00 AM

## 2016-11-05 NOTE — Progress Notes (Signed)
Patient refused to have foley catheter discontinued this morning secondary to pain.Patient asked that catheter be left in until next shift and pain better controlled.Will report off to next shift.

## 2016-11-05 NOTE — Evaluation (Signed)
Occupational Therapy Evaluation Patient Details Name: Abigail Huang MRN: [id number] DOB: [birth year] Today's Date: 11/05/2016    History of Present Illness Pt admit after fall with right direct THA anterior approach.  PMH: HTN,DM   Clinical Impression   Pt admitted with the above diagnoses and presents with below problem list. Pt will benefit from continued acute OT to address the below listed deficits and maximize independence with basic ADLs prior to d/c to venue below. PTA pt was independent with ADLs. Pt is currently min - max +2 assist with LB ADLs and functional mobility/transfers.      Follow Up Recommendations  SNF    Equipment Recommendations  Other (comment) (defer to next venue)    Recommendations for Other Services       Precautions / Restrictions Precautions Precautions: Fall Precaution Comments: no hip precautions Restrictions Weight Bearing Restrictions: Yes RLE Weight Bearing: Weight bearing as tolerated      Mobility Bed Mobility Overal bed mobility: Needs Assistance Bed Mobility: Supine to Sit     Supine to sit: Mod assist;+2 for physical assistance     General bed mobility comments: Needed assist for LEs and for elevation of trunk  Transfers Overall transfer level: Needs assistance Equipment used: Rolling walker (2 wheeled) Transfers: Sit to/from Stand Sit to Stand: +2 physical assistance;Max assist;From elevated surface         General transfer comment: Pt was max assist of 2 to stand to RW due to posterior lean by pt.  Pt needed incr assist until she was able to get feet placed and still needed min to mod assist for stacbility.     Balance Overall balance assessment: Needs assistance;History of Falls Sitting-balance support: Feet supported;Bilateral upper extremity supported Sitting balance-Leahy Scale: Poor Sitting balance - Comments: Needed UE support.   Postural control: Posterior lean Standing balance support: Bilateral upper  extremity supported;During functional activity Standing balance-Leahy Scale: Poor Standing balance comment: Posterior lean needing mod assist with RW.                            ADL either performed or assessed with clinical judgement   ADL Overall ADL's : Needs assistance/impaired Eating/Feeding: Set up;Sitting   Grooming: Set up;Sitting   Upper Body Bathing: Set up;Sitting   Lower Body Bathing: Maximal assistance;Sit to/from stand   Upper Body Dressing : Set up;Sitting   Lower Body Dressing: Maximal assistance;Sit to/from stand   Toilet Transfer: +2 for physical assistance;Stand-pivot;BSC;RW;Maximal assistance   Toileting- Clothing Manipulation and Hygiene: Maximal assistance;+2 for physical assistance;Sit to/from stand   Tub/ Banker: Maximal assistance;Stand-pivot;3 in 1;Rolling walker   Functional mobility during ADLs: Moderate assistance;Minimal assistance;+2 for physical assistance;Rolling walker General ADL Comments: Pt completed bed mobility and in-room functional mobility from bed to curtain. Recliner brought to pt. Discussed LB dressing technique.      Vision         Perception     Praxis      Pertinent Vitals/Pain Pain Assessment: 0-10 Pain Score: 7  Pain Location: right hip Pain Descriptors / Indicators: Aching;Grimacing;Guarding Pain Intervention(s): Limited activity within patient's tolerance;Monitored during session;Premedicated before session;Repositioned     Hand Dominance Right   Extremity/Trunk Assessment Upper Extremity Assessment Upper Extremity Assessment: Overall WFL for tasks assessed   Lower Extremity Assessment Lower Extremity Assessment: Defer to PT evaluation RLE Deficits / Details: grossly 2-/5   Cervical / Trunk Assessment Cervical / Trunk Assessment: Normal   Communication  Communication Communication: No difficulties   Cognition Arousal/Alertness: Awake/alert Behavior During Therapy: Anxious Overall  Cognitive Status: Within Functional Limits for tasks assessed                                     General Comments       Exercises   Shoulder Instructions      Home Living Family/patient expects to be discharged to:: Private residence Living Arrangements: Spouse/significant other Available Help at Discharge: Family;Available PRN/intermittently Type of Home: House Home Access: Stairs to enter [employer] of Steps: 1 Entrance Stairs-Rails: Left Home Layout: One level     Bathroom Shower/Tub: Teacher, early years/pre: Handicapped height Bathroom Accessibility: Yes   Home Equipment: Environmental consultant - 2 wheels;Cane - single point;Bedside commode;Grab bars - tub/shower   Additional Comments: uses plastic mat and stands, didn't like tub bench      Prior Functioning/Environment Level of Independence: Independent                 OT Problem List: Decreased activity tolerance;Impaired balance (sitting and/or standing);Decreased knowledge of use of DME or AE;Decreased knowledge of precautions;Pain      OT Treatment/Interventions: Self-care/ADL training;DME and/or AE instruction;Therapeutic activities;Patient/family education;Balance training    OT Goals(Current goals can be found in the care plan section) Acute Rehab OT Goals Patient Stated Goal: to go home OT Goal Formulation: With patient/family Time For Goal Achievement: 11/12/16 Potential to Achieve Goals: Good ADL Goals Pt Will Perform Lower Body Bathing: sit to/from stand;with min assist Pt Will Perform Lower Body Dressing: sit to/from stand;with min assist Pt Will Transfer to Toilet: ambulating;with min assist Pt Will Perform Toileting - Clothing Manipulation and hygiene: with min assist;sit to/from stand Pt Will Perform Tub/Shower Transfer: with min assist;ambulating;rolling walker Additional ADL Goal #1: Pt will complete bed mobility at min guard level to prepare for OOB ADLs.   OT  Frequency: Min 2X/week   Barriers to D/C:            Co-evaluation PT/OT/SLP Co-Evaluation/Treatment: Yes Reason for Co-Treatment: Complexity of the patient's impairments (multi-system involvement) PT goals addressed during session: Mobility/safety with mobility OT goals addressed during session: ADL's and self-care      AM-PAC PT "6 Clicks" Daily Activity     Outcome Measure                 End of Session Equipment Utilized During Treatment: Gait belt;Rolling walker Nurse Communication: Mobility status  Activity Tolerance: Patient tolerated treatment well;Patient limited by pain Patient left: in chair;with call bell/phone within reach;with chair alarm set;with family/visitor present  OT Visit Diagnosis: Unsteadiness on feet (R26.81);Pain;History of falling (Z91.81)                Time: 1601-0932 OT Time Calculation (min): 28 min Charges:  OT General Charges $OT Visit: 1 Procedure OT Evaluation $OT Eval Low Complexity: 1 Procedure G-Codes:       Hortencia Pilar 11/05/2016, 3:22 PM

## 2016-11-06 LAB — GLUCOSE, CAPILLARY
GLUCOSE-CAPILLARY: 122 mg/dL — AB (ref 65–99)
Glucose-Capillary: 161 mg/dL — ABNORMAL HIGH (ref 65–99)
Glucose-Capillary: 244 mg/dL — ABNORMAL HIGH (ref 65–99)
Glucose-Capillary: 220 mg/dL — ABNORMAL HIGH (ref 65–99)
Glucose-Capillary: 178 mg/dL — ABNORMAL HIGH (ref 65–99)

## 2016-11-06 LAB — BASIC METABOLIC PANEL
ANION GAP: 5 (ref 5–15)
BUN: 23 mg/dL — ABNORMAL HIGH (ref 6–20)
CO2: 19 mmol/L — AB (ref 22–32)
CREATININE: 1.92 mg/dL — AB (ref 0.44–1.00)
Calcium: 8.7 mg/dL — ABNORMAL LOW (ref 8.9–10.3)
Chloride: 103 mmol/L (ref 101–111)
GFR, EST AFRICAN AMERICAN: 27 mL/min — AB (ref >60)
GFR, EST NON AFRICAN AMERICAN: 23 mL/min — AB (ref >60)
GLUCOSE: 133 mg/dL — AB (ref 65–99)
Potassium: 4.5 mmol/L (ref 3.5–5.1)
Sodium: 127 mmol/L — ABNORMAL LOW (ref 135–145)

## 2016-11-06 LAB — CBC
HCT: 27.4 % — ABNORMAL LOW (ref 36.0–46.0)
HEMOGLOBIN: 9.3 g/dL — AB (ref 12.0–15.0)
MCH: 30.6 pg (ref 26.0–34.0)
MCHC: 33.9 g/dL (ref 30.0–36.0)
MCV: 90.1 fL (ref 78.0–100.0)
PLATELETS: 196 10*3/uL (ref 150–400)
RBC: 3.04 MIL/uL — AB (ref 3.87–5.11)
RDW: 13.1 % (ref 11.5–15.5)
WBC: 10.3 10*3/uL (ref 4.0–10.5)

## 2016-11-06 MED ORDER — INSULIN DETEMIR 100 UNIT/ML ~~LOC~~ SOLN
9.0000 [IU] | Freq: Every day | SUBCUTANEOUS | Status: DC
  Administered 2016-11-06 – 2016-11-08 (×2): 9 [IU] via SUBCUTANEOUS
  Filled 2016-11-06 (×3): qty 0.09

## 2016-11-06 MED ORDER — AMLODIPINE BESYLATE 5 MG PO TABS
5.0000 mg | ORAL_TABLET | Freq: Every day | ORAL | Status: DC
  Administered 2016-11-06 – 2016-11-08 (×3): 5 mg via ORAL
  Filled 2016-11-06 (×3): qty 1

## 2016-11-06 NOTE — Progress Notes (Signed)
PROGRESS NOTE    Abigail Huang  IRC:[id number] DOB: April 28, 1931 DOA: 11/03/2016 PCP: Burnard Bunting, MD    Brief Narrative: Abigail Huang is a 83 y.o. female with a past medical history significant for HTN, IDDM, and CKD who presents with hip pain after a fall.  Assessment & Plan:   Principal Problem:   Closed displaced fracture of right femoral neck (HCC) Active Problems:   Type 2 diabetes mellitus with stage 4 chronic kidney disease, with long-term current use of insulin (HCC)   Hyponatremia   Leukocytosis   Normocytic anemia   CKD (chronic kidney disease), stage IV (HCC)   Right hip fracture:  S/p rigth hip hemiarthroplasty.  Pain control.  PT evaluation recommended SNF. Will plan for d/c in am.  Appreciate orthopedics consult and recommendations.    Type 2 DM:  CBG (last 3)   Recent Labs  11/06/16 0120 11/06/16 0624 11/06/16 1207  GLUCAP 161* 122* 244*    Increased levemir to 9 units.  Resume SSI.     Hypertension:  Well controlled.  Resume home meds ,    Acute on Stage 4 CKD: Baseline creatinine is about 1.5. Creatinine on admission is 2 and improved to 1.8, stable at 1.8. Discussed the renal function with the patient and her daughter at bedside.   Leukocytosis:  Probably reactive.  Resolved.    Mild normocytic anemia/ anemia of blood loss from the surgery. With a component of anemia of chronic disease.  Transfuse when hemoglobin drops to less than 7. Dropped to 9.3 from 11.    Hyponatremia: improved with hydration.  Monitor. Repeat BMP is pending. Stopped HCTZ- triamterene.     DVT prophylaxis: scd's Code Status: (Full) Family Communication: NONE at bedside.  Disposition Plan: SNF in 1 to 2 days.    Consultants:   Orthopedics.    Procedures: right hip hemi arthroplasty. On 5/23   Antimicrobials:none.    Subjective: Slightly painful on touch and movement of the right hip. .   Objective: Vitals:   11/04/16 2122 11/05/16  0500 11/05/16 1427 11/05/16 2100  BP: (!) 146/59 (!) 142/60 136/62 (!) 144/93  Pulse: 87 80 68 87  Resp: 17 17 17 17   Temp: 99.2 F (37.3 C) 99.3 F (37.4 C) 97.9 F (36.6 C) 99.4 F (37.4 C)  TempSrc: Oral Oral Oral Oral  SpO2: 97% 95% 95% 97%  Weight:      Height:        Intake/Output Summary (Last 24 hours) at 11/06/16 1209 Last data filed at 11/06/16 1020  Gross per 24 hour  Intake           1697.5 ml  Output              500 ml  Net           1197.5 ml   Filed Weights   11/04/16 0100  Weight: 87.4 kg (192 lb 10.9 oz)    Examination:  General exam: Appears calm and comfortable  Respiratory system: Clear to auscultation. Respiratory effort normal. Cardiovascular system: S1 & S2 heard, RRR. No JVD, murmurs, rubs, gallops or clicks. No pedal edema. Gastrointestinal system: Abdomen is nondistended, soft and nontender. No organomegaly or masses felt. Normal bowel sounds heard. Central nervous system: Alert and oriented. No focal neurological deficits. Extremities:  No pedal edema.     Data Reviewed: I have personally reviewed following labs and imaging studies  CBC:  Recent Labs Lab 11/03/16 1942 11/04/16 0751 11/05/16 0400  11/06/16 0648  WBC 17.8* 10.4 9.3 10.3  NEUTROABS 15.8*  --   --   --   HGB 11.6* 11.0* 10.0* 9.3*  HCT 35.3* 33.3* 31.2* 27.4*  MCV 89.1 88.6 89.4 90.1  PLT 288 276 247 353   Basic Metabolic Panel:  Recent Labs Lab 11/03/16 1942 11/04/16 0751 11/05/16 0400  NA 130* 133* 131*  K 4.8 4.3 4.7  CL 103 106 103  CO2 18* 20* 23  GLUCOSE 168* 79 125*  BUN 39* 33* 23*  CREATININE 2.02* 1.80* 1.85*  CALCIUM 9.7 9.7 9.3   GFR: Estimated Creatinine Clearance: 25.2 mL/min (A) (by C-G formula based on SCr of 1.85 mg/dL (H)). Liver Function Tests: No results for input(s): AST, ALT, ALKPHOS, BILITOT, PROT, ALBUMIN in the last 168 hours. No results for input(s): LIPASE, AMYLASE in the last 168 hours. No results for input(s): AMMONIA in  the last 168 hours. Coagulation Profile: No results for input(s): INR, PROTIME in the last 168 hours. Cardiac Enzymes: No results for input(s): CKTOTAL, CKMB, CKMBINDEX, TROPONINI in the last 168 hours. BNP (last 3 results) No results for input(s): PROBNP in the last 8760 hours. HbA1C: No results for input(s): HGBA1C in the last 72 hours. CBG:  Recent Labs Lab 11/05/16 1707 11/05/16 2136 11/06/16 0120 11/06/16 0624 11/06/16 1207  GLUCAP 238* 169* 161* 122* 244*   Lipid Profile: No results for input(s): CHOL, HDL, LDLCALC, TRIG, CHOLHDL, LDLDIRECT in the last 72 hours. Thyroid Function Tests: No results for input(s): TSH, T4TOTAL, FREET4, T3FREE, THYROIDAB in the last 72 hours. Anemia Panel:  Recent Labs  11/04/16 0138  VITAMINB12 281  FERRITIN 53  TIBC 329  IRON 33  RETICCTPCT 0.9   Sepsis Labs: No results for input(s): PROCALCITON, LATICACIDVEN in the last 168 hours.  Recent Results (from the past 240 hour(s))  Surgical pcr screen     Status: None   Collection Time: 11/04/16  2:12 AM  Result Value Ref Range Status   MRSA, PCR NEGATIVE NEGATIVE Final   Staphylococcus aureus NEGATIVE NEGATIVE Final    Comment:        The Xpert SA Assay (FDA approved for NASAL specimens in patients over 92 years of age), is one component of a comprehensive surveillance program.  Test performance has been validated by [facility] for patients greater than or equal to 63 year old. It is not intended to diagnose infection nor to guide or monitor treatment.          Radiology Studies: Dg C-arm 1-60 Min  Result Date: 11/04/2016 CLINICAL DATA:  Right hip replacement EXAM: DG C-ARM 61-120 MIN; OPERATIVE RIGHT HIP WITH PELVIS COMPARISON:  Radiography from yesterday FINDINGS: Right hip bipolar hemiarthroplasty for treatment of femoral neck fracture. The prosthesis appears located there is no evidence of periprosthetic fracture. Remote left obturator ring fracture. IMPRESSION:  Fluoroscopy for right hip hemiarthroplasty. Electronically Signed   By: Monte Fantasia M.D.   On: 11/04/2016 14:08   Dg Hip Operative Unilat W Or W/o Pelvis Right  Result Date: 11/04/2016 CLINICAL DATA:  Right hip replacement EXAM: DG C-ARM 61-120 MIN; OPERATIVE RIGHT HIP WITH PELVIS COMPARISON:  Radiography from yesterday FINDINGS: Right hip bipolar hemiarthroplasty for treatment of femoral neck fracture. The prosthesis appears located there is no evidence of periprosthetic fracture. Remote left obturator ring fracture. IMPRESSION: Fluoroscopy for right hip hemiarthroplasty. Electronically Signed   By: Monte Fantasia M.D.   On: 11/04/2016 14:08        Scheduled Meds: .  aspirin EC  325 mg Oral BID PC  . atenolol  50 mg Oral Daily  . docusate sodium  100 mg Oral BID  . feeding supplement (GLUCERNA SHAKE)  237 mL Oral BID BM  . glipiZIDE  10 mg Oral Q breakfast  . insulin aspart  0-15 Units Subcutaneous TID WC  . insulin aspart  0-5 Units Subcutaneous QHS  . insulin detemir  7 Units Subcutaneous QHS  . lisinopril  20 mg Oral Daily  . nortriptyline  25 mg Oral Daily  . simvastatin  20 mg Oral Daily  . triamterene-hydrochlorothiazide  1 tablet Oral Daily   Continuous Infusions: . sodium chloride 75 mL/hr at 11/05/16 2040  . methocarbamol (ROBAXIN)  IV       LOS: 3 days    Time spent: 35 minutes.     Hosie Poisson, MD [facility] Pager [phone]  If 7PM-7AM, please contact night-coverage [email or web address] Password [facility] 11/06/2016, 12:09 PM

## 2016-11-06 NOTE — Progress Notes (Signed)
Physical Therapy Treatment Patient Details Name: Abigail Huang MRN: [id number] DOB: 04/12/1931 Today's Date: 11/06/2016    History of Present Illness Pt admit after fall with right direct THA anterior approach.  PMH: HTN,DM    PT Comments    The patient is mobilizing much better today, pain is minimal. Plans SNF.     Follow Up Recommendations  SNF;Supervision/Assistance - 24 hour     Equipment Recommendations  None recommended by PT    Recommendations for Other Services       Precautions / Restrictions Precautions Precautions: Fall Precaution Comments: no hip precautions Restrictions RLE Weight Bearing: Weight bearing as tolerated    Mobility  Bed Mobility   Bed Mobility: Supine to Sit     Supine to sit: Min assist     General bed mobility comments: Needed assist for LEs and for elevation of trunk  Transfers Overall transfer level: Needs assistance Equipment used: Rolling walker (2 wheeled) Transfers: Sit to/from Stand Sit to Stand: Min assist;From elevated surface         General transfer comment: cues for hand and right leg position,  Ambulation/Gait Ambulation/Gait assistance: Min assist Ambulation Distance (Feet): 40 Feet Assistive device: Rolling walker (2 wheeled) Gait Pattern/deviations: Step-to pattern;Step-through pattern     General Gait Details: cues for sequence and paosition inside RW.   Stairs            Wheelchair Mobility    Modified Rankin (Stroke Patients Only)       Balance                                            Cognition Arousal/Alertness: Awake/alert                                            Exercises Total Joint Exercises Ankle Circles/Pumps: AROM;Both;10 reps Quad Sets: AROM;Both;10 reps Short Arc Quad: AROM;Right;10 reps Heel Slides: AAROM;Right;10 reps Hip ABduction/ADduction: AAROM;Right;10 reps    General Comments        Pertinent Vitals/Pain Pain  Score: 3  Pain Location: right hip Pain Descriptors / Indicators: Sore Pain Intervention(s): Patient requesting pain meds-RN notified;Ice applied    Home Living                      Prior Function            PT Goals (current goals can now be found in the care plan section) Progress towards PT goals: Progressing toward goals    Frequency    Min 5X/week      PT Plan Frequency needs to be updated;Current plan remains appropriate    Co-evaluation              AM-PAC PT "6 Clicks" Daily Activity  Outcome Measure  Difficulty turning over in bed (including adjusting bedclothes, sheets and blankets)?: A Little Difficulty moving from lying on back to sitting on the side of the bed? : A Little Difficulty sitting down on and standing up from a chair with arms (e.g., wheelchair, bedside commode, etc,.)?: A Little Help needed moving to and from a bed to chair (including a wheelchair)?: A Little Help needed walking in hospital room?: A Little Help needed climbing 3-5 steps with a railing? :  A Lot 6 Click Score: 17    End of Session Equipment Utilized During Treatment: Gait belt Activity Tolerance: Patient tolerated treatment well Patient left: in chair;with call bell/phone within reach Nurse Communication: Mobility status PT Visit Diagnosis: Difficulty in walking, not elsewhere classified (R26.2) Pain - Right/Left: Right Pain - part of body: Hip     Time: 6122-4497 PT Time Calculation (min) (ACUTE ONLY): 33 min  Charges:  $Gait Training: 8-22 mins $Therapeutic Exercise: 8-22 mins                    G CodesTresa Endo PT [phone]   Claretha Cooper 11/06/2016, 10:44 AM

## 2016-11-06 NOTE — Progress Notes (Signed)
Assumed care of patient after receiving report from [place], [place].

## 2016-11-06 NOTE — Progress Notes (Signed)
Subjective: 2 Days Post-Op Procedure(s) (LRB): ANTERIOR APPROACH HEMI HIP ARTHROPLASTY (Right) Patient reports pain as mild.  Making slow progress with physical therapy. They have recommended skilled nursing facility. Taking by mouth and voiding okay. No dizziness. No shortness of breath. States she is ready for discharge to skilled nursing facility tomorrow morning  Objective: Vital signs in last 24 hours: Temp:  [97.9 F (36.6 C)-99.4 F (37.4 C)] 99.4 F (37.4 C) (05/24 2100) Pulse Rate:  [68-87] 87 (05/24 2100) Resp:  [17] 17 (05/24 2100) BP: (136-144)/(62-93) 144/93 (05/24 2100) SpO2:  [95 %-97 %] 97 % (05/24 2100)  Intake/Output from previous day: 05/24 0701 - 05/25 0700 In: 1817.5 [P.O.:600; I.V.:1217.5] Out: 1050 [Urine:1050] Intake/Output this shift: Total I/O In: 120 [P.O.:120] Out: -    Recent Labs  11/03/16 1942 11/04/16 0751 11/05/16 0400 11/06/16 0648  HGB 11.6* 11.0* 10.0* 9.3*    Recent Labs  11/05/16 0400 11/06/16 0648  WBC 9.3 10.3  RBC 3.49* 3.04*  HCT 31.2* 27.4*  PLT 247 196    Recent Labs  11/05/16 0400 11/06/16 1000  NA 131* 127*  K 4.7 4.5  CL 103 103  CO2 23 19*  BUN 23* 23*  CREATININE 1.85* 1.92*  GLUCOSE 125* 133*  CALCIUM 9.3 8.7*   No results for input(s): LABPT, INR in the last 72 hours. Right hip exam: Aquacell dressing is clean and dry. Neurovascular intact Sensation intact distally Intact pulses distally Dorsiflexion/Plantar flexion intact No cellulitis present Compartment soft  Assessment/Plan: 2 Days Post-Op Procedure(s) (LRB): ANTERIOR APPROACH HEMI HIP ARTHROPLASTY (Right) Plan: Weight-bear as tolerated on right lower extremity with no hip precautions. Aspirin 325 mg enteric-coated one twice daily 1 month postop for DVT prophylaxis. Norco 5 mg as needed for moderate pain. Use sparingly. Otherwise would use Tylenol as needed for pain. Right hip dressing is waterproof and can be left alone until she is  seen in the office in 2 weeks. Up with therapy Discharge to SNF tomorrow morning. Follow-up with Dr. Berenice Primas in 2 weeks.  Dubois G 11/06/2016, 1:27 PM

## 2016-11-06 NOTE — Care Management Important Message (Signed)
Important Message  Patient Details  Name: Abigail Huang MRN: [id number] Date of Birth: 1930/09/24   Medicare Important Message Given:  Yes    [name] 11/06/2016, 1:28 PM

## 2016-11-07 LAB — BASIC METABOLIC PANEL
ANION GAP: 8 (ref 5–15)
BUN: 32 mg/dL — ABNORMAL HIGH (ref 6–20)
CO2: 19 mmol/L — AB (ref 22–32)
Calcium: 9.2 mg/dL (ref 8.9–10.3)
Chloride: 101 mmol/L (ref 101–111)
Creatinine, Ser: 1.96 mg/dL — ABNORMAL HIGH (ref 0.44–1.00)
GFR calc Af Amer: 26 mL/min — ABNORMAL LOW (ref >60)
GFR calc non Af Amer: 22 mL/min — ABNORMAL LOW (ref >60)
GLUCOSE: 209 mg/dL — AB (ref 65–99)
POTASSIUM: 4.9 mmol/L (ref 3.5–5.1)
Sodium: 128 mmol/L — ABNORMAL LOW (ref 135–145)

## 2016-11-07 LAB — CBC
HEMATOCRIT: 25.6 % — AB (ref 36.0–46.0)
Hemoglobin: 8.6 g/dL — ABNORMAL LOW (ref 12.0–15.0)
MCH: 29.8 pg (ref 26.0–34.0)
MCHC: 33.6 g/dL (ref 30.0–36.0)
MCV: 88.6 fL (ref 78.0–100.0)
PLATELETS: 221 10*3/uL (ref 150–400)
RBC: 2.89 MIL/uL — AB (ref 3.87–5.11)
RDW: 12.7 % (ref 11.5–15.5)
WBC: 10 10*3/uL (ref 4.0–10.5)

## 2016-11-07 LAB — GLUCOSE, CAPILLARY
GLUCOSE-CAPILLARY: 128 mg/dL — AB (ref 65–99)
Glucose-Capillary: 119 mg/dL — ABNORMAL HIGH (ref 65–99)
Glucose-Capillary: 201 mg/dL — ABNORMAL HIGH (ref 65–99)
Glucose-Capillary: 209 mg/dL — ABNORMAL HIGH (ref 65–99)

## 2016-11-07 MED ORDER — DOCUSATE SODIUM 100 MG PO CAPS: 100.0000 mg | ORAL_CAPSULE | Freq: Two times a day (BID) | ORAL | 0 refills | Status: DC

## 2016-11-07 MED ORDER — AMLODIPINE BESYLATE 5 MG PO TABS: 5.0000 mg | ORAL_TABLET | Freq: Every day | ORAL | 0 refills | Status: DC

## 2016-11-07 MED ORDER — GLUCERNA SHAKE PO LIQD
237.0000 mL | Freq: Two times a day (BID) | ORAL | 0 refills | Status: DC
Start: 2016-11-07 — End: 2017-01-06

## 2016-11-07 NOTE — Care Management Note (Signed)
Case Management Note  Patient Details  Name: Abigail Huang MRN: [id number] Date of Birth: 08-29-1930  Subjective/Objective:                 DC to SNF as ordered by MD. Facilitated by CSW.   Action/Plan:   Expected Discharge Date:  11/07/16               Expected Discharge Plan:  Skilled Nursing Facility  In-House Referral:  Clinical Social Work  Discharge planning Services  CM Consult  Post Acute Care Choice:    Choice offered to:     DME Arranged:    DME Agency:     HH Arranged:    [place] Agency:     Status of Service:  Completed, signed off  If discussed at [employer], dates discussed:    Additional Comments:  Carles Collet, RN 11/07/2016, 2:21 PM

## 2016-11-07 NOTE — Discharge Summary (Signed)
Physician Discharge Summary  Abigail Huang QQV:[id number] DOB: 09/07/1930 DOA: 11/03/2016  PCP: Burnard Bunting, MD  Admit date: 11/03/2016 Discharge date: 11/07/2016  Admitted From: Home Disposition:  snf Abigail Huang.   Recommendations for Outpatient Follow-up:  1. Follow up with PCP in 1-2 weeks 2. Please obtain BMP/CBC in one week Please follow up with orthopedics as recommended.    Discharge Condition:stable.  CODE STATUS:full code.  Diet recommendation: Heart Healthy   Brief/Interim Summary: Abigail Sedivy Willardis a 83 y.o.femalewith a past medical history significant for HTN, IDDM, and CKDwho presents with hip pain after a fall.  Discharge Diagnoses:  Principal Problem:   Closed displaced fracture of right femoral neck (HCC) Active Problems:   Type 2 diabetes mellitus with stage 4 chronic kidney disease, with long-term current use of insulin (HCC)   Hyponatremia   Leukocytosis   Normocytic anemia   CKD (chronic kidney disease), stage IV (HCC)  Right hip fracture:  S/p rigth hip hemiarthroplasty.  Pain control.  PT evaluation recommended SNF. Will plan for d/c in am.  Appreciate orthopedics consult and recommendations.    Type 2 DM:  CBG (last 3)   Recent Labs (last 2 labs)    Recent Labs  11/06/16 0120 11/06/16 0624 11/06/16 1207  GLUCAP 161* 122* 244*      Resume levemir and SSI.     Hypertension:  Well controlled.  Resume home meds ,    Acute on Stage 4 CKD: Baseline creatinine is about 1.5. Creatinine on admission is 2 and improved to 1.8, currently stable around 1.9 Discussed the renal function with the patient and her daughter at bedside. Recommend follow up with nephrology as outpatient.  Leukocytosis:  Probably reactive.  Resolved.    Mild normocytic anemia/ anemia of blood loss from the surgery. With a component of anemia of chronic disease.  Transfuse when hemoglobin drops to less than 7. Dropped to 8.6 from 11.   Please check cbc in one week.    Hyponatremia: improved with hydration.  Monitor. Repeat BMP shows mild improvement. Stopped HCTZ- triamterene.      Discharge Instructions  Discharge Instructions    Diet - low sodium heart healthy    Complete by:  As directed    Discharge instructions    Complete by:  As directed    Please follow up with PCP in one week.  Please check sodium with bmp on Monday.  Please follow up with orthopedics as recommended.   Increase activity slowly    Complete by:  As directed    Weight bearing as tolerated    Complete by:  As directed    Laterality:  right   Extremity:  Lower     Allergies as of 11/07/2016   No Known Allergies     Medication List    STOP taking these medications   metFORMIN 1000 MG tablet Commonly known as:  GLUCOPHAGE   triamterene-hydrochlorothiazide 75-50 MG tablet Commonly known as:  MAXZIDE     TAKE these medications   amLODipine 5 MG tablet Commonly known as:  NORVASC Take 1 tablet (5 mg total) by mouth daily. Start taking on:  11/08/2016   aspirin EC 325 MG tablet Take 1 tablet (325 mg total) by mouth 2 (two) times daily after a meal. Take x 1 month post op to decrease risk of blood clots.   atenolol 50 MG tablet Commonly known as:  TENORMIN Take 50 mg by mouth daily.   CALTRATE 600+D PO Take 1 tablet  by mouth daily.   CENTRUM SILVER PO Take 1 tablet by mouth daily.   docusate sodium 100 MG capsule Commonly known as:  COLACE Take 1 capsule (100 mg total) by mouth 2 (two) times daily.   estrogens (conjugated) 0.3 MG tablet Commonly known as:  PREMARIN Take 0.3 mg by mouth daily. Take daily for 21 days then do not take for 7 days.   feeding supplement (GLUCERNA SHAKE) Liqd Take 237 mLs by mouth 2 (two) times daily between meals.   glipiZIDE 10 MG 24 hr tablet Commonly known as:  GLUCOTROL XL Take 10 mg by mouth daily with breakfast.   HYDROcodone-acetaminophen 5-325 MG tablet Commonly known  as:  NORCO/VICODIN Take 1-2 tablets by mouth every 6 (six) hours as needed for moderate pain.   insulin detemir 100 UNIT/ML injection Commonly known as:  LEVEMIR Inject 10 Units into the skin daily.   lisinopril 20 MG tablet Commonly known as:  PRINIVIL,ZESTRIL Take 20 mg by mouth daily.   niacin 500 MG tablet Take 500 mg by mouth daily.   nortriptyline 25 MG capsule Commonly known as:  PAMELOR Take 25 mg by mouth daily.   simvastatin 20 MG tablet Commonly known as:  ZOCOR Take 20 mg by mouth daily.       Contact information for follow-up providers    Dorna Leitz, MD. Schedule an appointment as soon as possible for a visit in 2 weeks.   Specialty:  Orthopedic Surgery Contact information: [place] [address] [phone]            Contact information for after-discharge care    Destination    [facility] .   Specialty:  [place] information: [address] [place] [phone]                 No Known Allergies  Consultations:  Orthopedics.    Procedures/Studies: Dg Chest 1 View  Result Date: 11/03/2016 CLINICAL DATA:  Fall in living room EXAM: CHEST 1 VIEW COMPARISON:  03/08/2012 FINDINGS: Mild atelectasis at the left base. No acute consolidation or effusion. Stable cardiomediastinal silhouette. No pneumothorax. IMPRESSION: Mild hypoventilatory changes. No radiographic evidence for acute cardiopulmonary abnormality Electronically Signed   By: Donavan Foil M.D.   On: 11/03/2016 21:12   Dg C-arm 1-60 Min  Result Date: 11/04/2016 CLINICAL DATA:  Right hip replacement EXAM: DG C-ARM 61-120 MIN; OPERATIVE RIGHT HIP WITH PELVIS COMPARISON:  Radiography from yesterday FINDINGS: Right hip bipolar hemiarthroplasty for treatment of femoral neck fracture. The prosthesis appears located there is no evidence of periprosthetic fracture. Remote left obturator ring  fracture. IMPRESSION: Fluoroscopy for right hip hemiarthroplasty. Electronically Signed   By: Monte Fantasia M.D.   On: 11/04/2016 14:08   Dg Hip Operative Unilat W Or W/o Pelvis Right  Result Date: 11/04/2016 CLINICAL DATA:  Right hip replacement EXAM: DG C-ARM 61-120 MIN; OPERATIVE RIGHT HIP WITH PELVIS COMPARISON:  Radiography from yesterday FINDINGS: Right hip bipolar hemiarthroplasty for treatment of femoral neck fracture. The prosthesis appears located there is no evidence of periprosthetic fracture. Remote left obturator ring fracture. IMPRESSION: Fluoroscopy for right hip hemiarthroplasty. Electronically Signed   By: Monte Fantasia M.D.   On: 11/04/2016 14:08   Dg Hip Unilat W Or Wo Pelvis 2-3 Views Right  Result Date: 11/03/2016 CLINICAL DATA:  83 y/o F; status post fall with right leg deformity. EXAM: DG HIP (WITH OR WITHOUT PELVIS) 2-3V RIGHT COMPARISON:  None.  FINDINGS: No hip dislocation. Acute transcervical fracture of the right femoral neck with coxa vara angulation, and mild superior displacement of the femoral shaft. Chronic fracture deformity of the left inferior pubic ramus. Age-indeterminate fracture deformity of the left superior pubic ramus. Extensive vascular calcification. IMPRESSION: 1. Acute transcervical fracture of the right femoral neck with coxa vara angulation and mild superior displacement of the femoral shaft. 2. Chronic fracture deformity of the left inferior pubic ramus. 3. Age-indeterminate fracture deformity of the left superior pubic ramus. Electronically Signed   By: Kristine Garbe M.D.   On: 11/03/2016 21:13   Dg Femur Min 2 Views Right  Result Date: 11/03/2016 CLINICAL DATA:  83 y/o F; status post fall with right leg deformity. EXAM: RIGHT FEMUR 2 VIEWS COMPARISON:  None. FINDINGS: No hip dislocation. Acute transcervical fracture of the right femoral neck with coxa vara angulation, and mild superior displacement of the femoral shaft. Extensive vascular  calcification. Knee joint is grossly maintained. Superior patellar enthesophyte. IMPRESSION: Acute transcervical fracture of the right femoral neck with coxa vara angulation and mild superior displacement of the femoral shaft. Electronically Signed   By: Kristine Garbe M.D.   On: 11/03/2016 21:14       Subjective: NO NEW COMPLAINTS.   Discharge Exam: Vitals:   11/06/16 2015 11/07/16 0541  BP: (!) 148/58 138/76  Pulse: 89 100  Resp: 20 16  Temp: 97.7 F (36.5 C) 98.8 F (37.1 C)   Vitals:   11/05/16 2100 11/06/16 1606 11/06/16 2015 11/07/16 0541  BP: (!) 144/93 (!) 113/57 (!) 148/58 138/76  Pulse: 87 84 89 100  Resp: 17 18 20 16   Temp: 99.4 F (37.4 C) 98.1 F (36.7 C) 97.7 F (36.5 C) 98.8 F (37.1 C)  TempSrc: Oral Oral Oral Oral  SpO2: 97% 98% 98% 100%  Weight:      Height:        General: Pt is alert, awake, not in acute distress Cardiovascular: RRR, S1/S2 +, no rubs, no gallops Respiratory: CTA bilaterally, no wheezing, no rhonchi Abdominal: Soft, NT, ND, bowel sounds + Extremities: no edema, no cyanosis    The results of significant diagnostics from this hospitalization (including imaging, microbiology, ancillary and laboratory) are listed below for reference.     Microbiology: Recent Results (from the past 240 hour(s))  Surgical pcr screen     Status: None   Collection Time: 11/04/16  2:12 AM  Result Value Ref Range Status   MRSA, PCR NEGATIVE NEGATIVE Final   Staphylococcus aureus NEGATIVE NEGATIVE Final    Comment:        The Xpert SA Assay (FDA approved for NASAL specimens in patients over 47 years of age), is one component of a comprehensive surveillance program.  Test performance has been validated by [facility] for patients greater than or equal to 32 year old. It is not intended to diagnose infection nor to guide or monitor treatment.      Labs: BNP (last 3 results) No results for input(s): BNP in the last 8760  hours. Basic Metabolic Panel:  Recent Labs Lab 11/03/16 1942 11/04/16 0751 11/05/16 0400 11/06/16 1000 11/07/16 1117  NA 130* 133* 131* 127* 128*  K 4.8 4.3 4.7 4.5 4.9  CL 103 106 103 103 101  CO2 18* 20* 23 19* 19*  GLUCOSE 168* 79 125* 133* 209*  BUN 39* 33* 23* 23* 32*  CREATININE 2.02* 1.80* 1.85* 1.92* 1.96*  CALCIUM 9.7 9.7 9.3 8.7* 9.2   Liver  Function Tests: No results for input(s): AST, ALT, ALKPHOS, BILITOT, PROT, ALBUMIN in the last 168 hours. No results for input(s): LIPASE, AMYLASE in the last 168 hours. No results for input(s): AMMONIA in the last 168 hours. CBC:  Recent Labs Lab 11/03/16 1942 11/04/16 0751 11/05/16 0400 11/06/16 0648 11/07/16 0404  WBC 17.8* 10.4 9.3 10.3 10.0  NEUTROABS 15.8*  --   --   --   --   HGB 11.6* 11.0* 10.0* 9.3* 8.6*  HCT 35.3* 33.3* 31.2* 27.4* 25.6*  MCV 89.1 88.6 89.4 90.1 88.6  PLT 288 276 247 196 221   Cardiac Enzymes: No results for input(s): CKTOTAL, CKMB, CKMBINDEX, TROPONINI in the last 168 hours. BNP: Invalid input(s): POCBNP CBG:  Recent Labs Lab 11/06/16 1207 11/06/16 1609 11/06/16 2118 11/07/16 0556 11/07/16 1134  GLUCAP 244* 220* 178* 119* 201*   D-Dimer No results for input(s): DDIMER in the last 72 hours. Hgb A1c No results for input(s): HGBA1C in the last 72 hours. Lipid Profile No results for input(s): CHOL, HDL, LDLCALC, TRIG, CHOLHDL, LDLDIRECT in the last 72 hours. Thyroid function studies No results for input(s): TSH, T4TOTAL, T3FREE, THYROIDAB in the last 72 hours.  Invalid input(s): FREET3 Anemia work up No results for input(s): VITAMINB12, FOLATE, FERRITIN, TIBC, IRON, RETICCTPCT in the last 72 hours. Urinalysis    Component Value Date/Time   COLORURINE STRAW (A) 11/03/2016 2303   APPEARANCEUR CLEAR 11/03/2016 2303   LABSPEC 1.008 11/03/2016 2303   PHURINE 5.0 11/03/2016 2303   GLUCOSEU NEGATIVE 11/03/2016 2303   HGBUR SMALL (A) 11/03/2016 2303   BILIRUBINUR NEGATIVE  11/03/2016 2303   KETONESUR NEGATIVE 11/03/2016 2303   PROTEINUR 100 (A) 11/03/2016 2303   UROBILINOGEN 0.2 03/08/2012 1613   NITRITE NEGATIVE 11/03/2016 2303   LEUKOCYTESUR NEGATIVE 11/03/2016 2303   Sepsis Labs Invalid input(s): PROCALCITONIN,  WBC,  LACTICIDVEN Microbiology Recent Results (from the past 240 hour(s))  Surgical pcr screen     Status: None   Collection Time: 11/04/16  2:12 AM  Result Value Ref Range Status   MRSA, PCR NEGATIVE NEGATIVE Final   Staphylococcus aureus NEGATIVE NEGATIVE Final    Comment:        The Xpert SA Assay (FDA approved for NASAL specimens in patients over 58 years of age), is one component of a comprehensive surveillance program.  Test performance has been validated by [facility] for patients greater than or equal to 25 year old. It is not intended to diagnose infection nor to guide or monitor treatment.      Time coordinating discharge: Over 30 minutes  SIGNED:   Hosie Poisson, MD  [facility] 11/07/2016, 2:22 PM Pager   If 7PM-7AM, please contact night-coverage [email or web address] Password [email or web address]

## 2016-11-08 DIAGNOSIS — N184 Chronic kidney disease, stage 4 (severe): Secondary | ICD-10-CM | POA: Diagnosis not present

## 2016-11-08 DIAGNOSIS — M7989 Other specified soft tissue disorders: Secondary | ICD-10-CM | POA: Diagnosis not present

## 2016-11-08 DIAGNOSIS — E784 Other hyperlipidemia: Secondary | ICD-10-CM | POA: Diagnosis not present

## 2016-11-08 DIAGNOSIS — Z794 Long term (current) use of insulin: Secondary | ICD-10-CM | POA: Diagnosis not present

## 2016-11-08 DIAGNOSIS — E1129 Type 2 diabetes mellitus with other diabetic kidney complication: Secondary | ICD-10-CM | POA: Diagnosis not present

## 2016-11-08 DIAGNOSIS — M1990 Unspecified osteoarthritis, unspecified site: Secondary | ICD-10-CM | POA: Diagnosis not present

## 2016-11-08 DIAGNOSIS — S72091D Other fracture of head and neck of right femur, subsequent encounter for closed fracture with routine healing: Secondary | ICD-10-CM | POA: Diagnosis not present

## 2016-11-08 DIAGNOSIS — Z4789 Encounter for other orthopedic aftercare: Secondary | ICD-10-CM | POA: Diagnosis not present

## 2016-11-08 DIAGNOSIS — E1122 Type 2 diabetes mellitus with diabetic chronic kidney disease: Secondary | ICD-10-CM | POA: Diagnosis not present

## 2016-11-08 DIAGNOSIS — R278 Other lack of coordination: Secondary | ICD-10-CM | POA: Diagnosis not present

## 2016-11-08 DIAGNOSIS — I10 Essential (primary) hypertension: Secondary | ICD-10-CM | POA: Diagnosis not present

## 2016-11-08 DIAGNOSIS — R2689 Other abnormalities of gait and mobility: Secondary | ICD-10-CM | POA: Diagnosis not present

## 2016-11-08 DIAGNOSIS — S72009D Fracture of unspecified part of neck of unspecified femur, subsequent encounter for closed fracture with routine healing: Secondary | ICD-10-CM | POA: Diagnosis not present

## 2016-11-08 DIAGNOSIS — D649 Anemia, unspecified: Secondary | ICD-10-CM | POA: Diagnosis not present

## 2016-11-08 DIAGNOSIS — K219 Gastro-esophageal reflux disease without esophagitis: Secondary | ICD-10-CM | POA: Diagnosis not present

## 2016-11-08 DIAGNOSIS — E118 Type 2 diabetes mellitus with unspecified complications: Secondary | ICD-10-CM | POA: Diagnosis not present

## 2016-11-08 DIAGNOSIS — S72001D Fracture of unspecified part of neck of right femur, subsequent encounter for closed fracture with routine healing: Secondary | ICD-10-CM | POA: Diagnosis not present

## 2016-11-08 LAB — BASIC METABOLIC PANEL
Anion gap: 6 (ref 5–15)
BUN: 38 mg/dL — AB (ref 6–20)
CALCIUM: 9.7 mg/dL (ref 8.9–10.3)
CO2: 21 mmol/L — AB (ref 22–32)
CREATININE: 1.98 mg/dL — AB (ref 0.44–1.00)
Chloride: 101 mmol/L (ref 101–111)
GFR calc non Af Amer: 22 mL/min — ABNORMAL LOW (ref >60)
GFR, EST AFRICAN AMERICAN: 26 mL/min — AB (ref >60)
GLUCOSE: 261 mg/dL — AB (ref 65–99)
Potassium: 5.8 mmol/L — ABNORMAL HIGH (ref 3.5–5.1)
Sodium: 128 mmol/L — ABNORMAL LOW (ref 135–145)

## 2016-11-08 LAB — GLUCOSE, CAPILLARY
Glucose-Capillary: 83 mg/dL (ref 65–99)
Glucose-Capillary: 228 mg/dL — ABNORMAL HIGH (ref 65–99)

## 2016-11-08 NOTE — Progress Notes (Addendum)
CSW attempting to page provider. [facility] requiring updated D/C summary dated 12/09/16 before admitting patient on 5/27.  CSW has updated Therapist, sports.  CSW will continue to follow for D/C needs.    Alphonse Guild. Berlyn Malina, [place], LCAS Clinical Social Worker Ph: [phone]

## 2016-11-08 NOTE — Discharge Summary (Addendum)
Physician Discharge Summary  Abigail Huang BDZ:[id number] DOB: May 19, 1931 DOA: 11/03/2016  PCP: Burnard Bunting, MD  Admit date: 11/03/2016 Discharge date: 11/08/2016  Admitted From: Home Disposition:  [facility].   This is a brief addendum to DC summary completed by Dr.Akula yesterday  Recommendations for Outpatient Follow-up:  1. Follow up with PCP in 1-2 weeks 2. Please obtain BMP in 2-3days to check K and creatinine Please follow up with orthopedics Dr.Graves in [place]   Discharge Condition:stable.  CODE STATUS:full code.  Diet recommendation: Heart Healthy    Discharge Diagnoses:  Closed displaced fracture of right femoral neck (HCC) Active Problems:   Type 2 diabetes mellitus with stage 4 chronic kidney disease, with long-term current use of insulin (HCC)   Hyponatremia   Leukocytosis   Normocytic anemia   CKD (chronic kidney disease), stage IV ([place])  Brief/Interim Summary: Abigail Levans Willardis a 83 y.o.femalewith a past medical history significant for HTN, IDDM, and CKDwho presented with hip pain after a fall.  Right hip fracture:  -Ortho consulted, S/p right hip hemiarthroplasty 5/23 -Pain controlled -PT evaluation completed, recommended SNF -Started on ASA 325mg  BID per Ortho post op for 67month DVT prophylaxis  Mild Hyponatremia -asymptomatic -improved with hydration -HCTZ stopped -FU bmet in 1 week  Type 2 DM:  stable Resumed levemir and SSI.   Hypertension:  Well controlled  -stopped ACE   Acute on Stage 4 CKD: Baseline creatinine is about 1.5. Creatinine on admission is 2 and improved to 1.8, currently stable around 1.9 -stopped lisinopril, FU with Renal outpatient -K 5.8 yesterday but iSTAT K was 4.9 and hence Dced home, needs Bmet in 3days  Leukocytosis:  Probably reactive.  Resolved.   Mild normocytic anemia/ anemia of blood loss from the surgery. - With a component of anemia of chronic disease.  - stable now in 8.6 range  post op -start Iron at PCP FU and, check CBC in 1-2weeks     Discharge Instructions  Discharge Instructions    Diet - low sodium heart healthy    Complete by:  As directed    Discharge instructions    Complete by:  As directed    Please follow up with PCP in one week.  Please check sodium with bmp on Monday.  Please follow up with orthopedics as recommended.   Increase activity slowly    Complete by:  As directed    Weight bearing as tolerated    Complete by:  As directed    Laterality:  right   Extremity:  Lower     Allergies as of 11/08/2016   No Known Allergies     Medication List    STOP taking these medications   lisinopril 20 MG tablet Commonly known as:  PRINIVIL,ZESTRIL   metFORMIN 1000 MG tablet Commonly known as:  GLUCOPHAGE   triamterene-hydrochlorothiazide 75-50 MG tablet Commonly known as:  MAXZIDE     TAKE these medications   amLODipine 5 MG tablet Commonly known as:  NORVASC Take 1 tablet (5 mg total) by mouth daily.   aspirin EC 325 MG tablet Take 1 tablet (325 mg total) by mouth 2 (two) times daily after a meal. Take x 1 month post op to decrease risk of blood clots.   atenolol 50 MG tablet Commonly known as:  TENORMIN Take 50 mg by mouth daily.   CALTRATE 600+D PO Take 1 tablet by mouth daily.   CENTRUM SILVER PO Take 1 tablet by mouth daily.   docusate sodium  100 MG capsule Commonly known as:  COLACE Take 1 capsule (100 mg total) by mouth 2 (two) times daily.   estrogens (conjugated) 0.3 MG tablet Commonly known as:  PREMARIN Take 0.3 mg by mouth daily. Take daily for 21 days then do not take for 7 days.   feeding supplement (GLUCERNA SHAKE) Liqd Take 237 mLs by mouth 2 (two) times daily between meals.   glipiZIDE 10 MG 24 hr tablet Commonly known as:  GLUCOTROL XL Take 10 mg by mouth daily with breakfast.   HYDROcodone-acetaminophen 5-325 MG tablet Commonly known as:  NORCO/VICODIN Take 1-2 tablets by mouth every 6 (six)  hours as needed for moderate pain.   insulin detemir 100 UNIT/ML injection Commonly known as:  LEVEMIR Inject 10 Units into the skin daily.   niacin 500 MG tablet Take 500 mg by mouth daily.   nortriptyline 25 MG capsule Commonly known as:  PAMELOR Take 25 mg by mouth daily.   simvastatin 20 MG tablet Commonly known as:  ZOCOR Take 20 mg by mouth daily.       Contact information for follow-up providers    Dorna Leitz, MD. Schedule an appointment as soon as possible for a visit in 2 weeks.   Specialty:  Orthopedic Surgery Contact information: [place] [address] [phone]            Contact information for after-discharge care    Destination    [facility] .   Specialty:  [place] information: [address] [place] [phone]                 No Known Allergies  Consultations:  Orthopedics.    Procedures/Studies: Dg Chest 1 View  Result Date: 11/03/2016 CLINICAL DATA:  Fall in living room EXAM: CHEST 1 VIEW COMPARISON:  03/08/2012 FINDINGS: Mild atelectasis at the left base. No acute consolidation or effusion. Stable cardiomediastinal silhouette. No pneumothorax. IMPRESSION: Mild hypoventilatory changes. No radiographic evidence for acute cardiopulmonary abnormality Electronically Signed   By: Donavan Foil M.D.   On: 11/03/2016 21:12   Dg C-arm 1-60 Min  Result Date: 11/04/2016 CLINICAL DATA:  Right hip replacement EXAM: DG C-ARM 61-120 MIN; OPERATIVE RIGHT HIP WITH PELVIS COMPARISON:  Radiography from yesterday FINDINGS: Right hip bipolar hemiarthroplasty for treatment of femoral neck fracture. The prosthesis appears located there is no evidence of periprosthetic fracture. Remote left obturator ring fracture. IMPRESSION: Fluoroscopy for right hip hemiarthroplasty. Electronically Signed   By: Monte Fantasia M.D.   On: 11/04/2016 14:08   Dg Hip Operative  Unilat W Or W/o Pelvis Right  Result Date: 11/04/2016 CLINICAL DATA:  Right hip replacement EXAM: DG C-ARM 61-120 MIN; OPERATIVE RIGHT HIP WITH PELVIS COMPARISON:  Radiography from yesterday FINDINGS: Right hip bipolar hemiarthroplasty for treatment of femoral neck fracture. The prosthesis appears located there is no evidence of periprosthetic fracture. Remote left obturator ring fracture. IMPRESSION: Fluoroscopy for right hip hemiarthroplasty. Electronically Signed   By: Monte Fantasia M.D.   On: 11/04/2016 14:08   Dg Hip Unilat W Or Wo Pelvis 2-3 Views Right  Result Date: 11/03/2016 CLINICAL DATA:  83 y/o F; status post fall with right leg deformity. EXAM: DG HIP (WITH OR WITHOUT PELVIS) 2-3V RIGHT COMPARISON:  None. FINDINGS: No hip dislocation. Acute transcervical fracture of the right femoral neck with coxa vara angulation, and mild superior displacement of the femoral shaft. Chronic fracture deformity of the left inferior pubic ramus. Age-indeterminate fracture  deformity of the left superior pubic ramus. Extensive vascular calcification. IMPRESSION: 1. Acute transcervical fracture of the right femoral neck with coxa vara angulation and mild superior displacement of the femoral shaft. 2. Chronic fracture deformity of the left inferior pubic ramus. 3. Age-indeterminate fracture deformity of the left superior pubic ramus. Electronically Signed   By: Kristine Garbe M.D.   On: 11/03/2016 21:13   Dg Femur Min 2 Views Right  Result Date: 11/03/2016 CLINICAL DATA:  83 y/o F; status post fall with right leg deformity. EXAM: RIGHT FEMUR 2 VIEWS COMPARISON:  None. FINDINGS: No hip dislocation. Acute transcervical fracture of the right femoral neck with coxa vara angulation, and mild superior displacement of the femoral shaft. Extensive vascular calcification. Knee joint is grossly maintained. Superior patellar enthesophyte. IMPRESSION: Acute transcervical fracture of the right femoral neck with coxa  vara angulation and mild superior displacement of the femoral shaft. Electronically Signed   By: Kristine Garbe M.D.   On: 11/03/2016 21:14      Subjective: NO NEW COMPLAINTS.   Discharge Exam: Vitals:   11/07/16 2153 11/08/16 0641  BP: (!) 148/58 (!) 131/53  Pulse: 81 62  Resp: 18 16  Temp: 98.7 F (37.1 C) 98.5 F (36.9 C)   Vitals:   11/07/16 0541 11/07/16 1947 11/07/16 2153 11/08/16 0641  BP: 138/76 137/60 (!) 148/58 (!) 131/53  Pulse: 100 80 81 62  Resp: 16  18 16   Temp: 98.8 F (37.1 C) 99.4 F (37.4 C) 98.7 F (37.1 C) 98.5 F (36.9 C)  TempSrc: Oral Oral Oral Oral  SpO2: 100% 100% 100% 99%  Weight:      Height:        General: Pt is alert, awake, not in acute distress Cardiovascular: RRR, S1/S2 +, no rubs, no gallops Respiratory: CTA bilaterally, no wheezing, no rhonchi Abdominal: Soft, NT, ND, bowel sounds + Extremities: no edema, no cyanosis    The results of significant diagnostics from this hospitalization (including imaging, microbiology, ancillary and laboratory) are listed below for reference.     Microbiology: Recent Results (from the past 240 hour(s))  Surgical pcr screen     Status: None   Collection Time: 11/04/16  2:12 AM  Result Value Ref Range Status   MRSA, PCR NEGATIVE NEGATIVE Final   Staphylococcus aureus NEGATIVE NEGATIVE Final    Comment:        The Xpert SA Assay (FDA approved for NASAL specimens in patients over 59 years of age), is one component of a comprehensive surveillance program.  Test performance has been validated by [facility] for patients greater than or equal to 45 year old. It is not intended to diagnose infection nor to guide or monitor treatment.      Labs: BNP (last 3 results) No results for input(s): BNP in the last 8760 hours. Basic Metabolic Panel:  Recent Labs Lab 11/04/16 0751 11/05/16 0400 11/06/16 1000 11/07/16 1117 11/08/16 1135  NA 133* 131* 127* 128* 128*  K 4.3 4.7 4.5  4.9 5.8*  CL 106 103 103 101 101  CO2 20* 23 19* 19* 21*  GLUCOSE 79 125* 133* 209* 261*  BUN 33* 23* 23* 32* 38*  CREATININE 1.80* 1.85* 1.92* 1.96* 1.98*  CALCIUM 9.7 9.3 8.7* 9.2 9.7   Liver Function Tests: No results for input(s): AST, ALT, ALKPHOS, BILITOT, PROT, ALBUMIN in the last 168 hours. No results for input(s): LIPASE, AMYLASE in the last 168 hours. No results for input(s): AMMONIA in the last  168 hours. CBC:  Recent Labs Lab 11/03/16 1942 11/04/16 0751 11/05/16 0400 11/06/16 0648 11/07/16 0404  WBC 17.8* 10.4 9.3 10.3 10.0  NEUTROABS 15.8*  --   --   --   --   HGB 11.6* 11.0* 10.0* 9.3* 8.6*  HCT 35.3* 33.3* 31.2* 27.4* 25.6*  MCV 89.1 88.6 89.4 90.1 88.6  PLT 288 276 247 196 221   Cardiac Enzymes: No results for input(s): CKTOTAL, CKMB, CKMBINDEX, TROPONINI in the last 168 hours. BNP: Invalid input(s): POCBNP CBG:  Recent Labs Lab 11/07/16 1134 11/07/16 1721 11/07/16 2157 11/08/16 0644 11/08/16 1148  GLUCAP 201* 209* 128* 83 228*   D-Dimer No results for input(s): DDIMER in the last 72 hours. Hgb A1c No results for input(s): HGBA1C in the last 72 hours. Lipid Profile No results for input(s): CHOL, HDL, LDLCALC, TRIG, CHOLHDL, LDLDIRECT in the last 72 hours. Thyroid function studies No results for input(s): TSH, T4TOTAL, T3FREE, THYROIDAB in the last 72 hours.  Invalid input(s): FREET3 Anemia work up No results for input(s): VITAMINB12, FOLATE, FERRITIN, TIBC, IRON, RETICCTPCT in the last 72 hours. Urinalysis    Component Value Date/Time   COLORURINE STRAW (A) 11/03/2016 2303   APPEARANCEUR CLEAR 11/03/2016 2303   LABSPEC 1.008 11/03/2016 2303   PHURINE 5.0 11/03/2016 2303   GLUCOSEU NEGATIVE 11/03/2016 2303   HGBUR SMALL (A) 11/03/2016 2303   BILIRUBINUR NEGATIVE 11/03/2016 2303   KETONESUR NEGATIVE 11/03/2016 2303   PROTEINUR 100 (A) 11/03/2016 2303   UROBILINOGEN 0.2 03/08/2012 1613   NITRITE NEGATIVE 11/03/2016 2303   LEUKOCYTESUR  NEGATIVE 11/03/2016 2303   Sepsis Labs Invalid input(s): PROCALCITONIN,  WBC,  LACTICIDVEN Microbiology Recent Results (from the past 240 hour(s))  Surgical pcr screen     Status: None   Collection Time: 11/04/16  2:12 AM  Result Value Ref Range Status   MRSA, PCR NEGATIVE NEGATIVE Final   Staphylococcus aureus NEGATIVE NEGATIVE Final    Comment:        The Xpert SA Assay (FDA approved for NASAL specimens in patients over 44 years of age), is one component of a comprehensive surveillance program.  Test performance has been validated by [facility] for patients greater than or equal to 72 year old. It is not intended to diagnose infection nor to guide or monitor treatment.      Time coordinating discharge: Over 10 minutes, was completed yesterday  SIGNED:   Domenic Polite, MD  [facility] 11/08/2016, 1:37 PM   If 7PM-7AM, please contact night-coverage [email or web address] Password [email or web address]

## 2016-11-08 NOTE — Clinical Social Work Placement (Addendum)
   CLINICAL SOCIAL WORK PLACEMENT  NOTE  Date:  11/08/2016  Patient Details  Name: Abigail Huang MRN: [id number] Date of Birth: 16-Mar-1931  Clinical Social Work is seeking post-discharge placement for this patient at the [place] level of care (*CSW will initial, date and re-position this form in  chart as items are completed):  Yes   Patient/family provided with [place] Work Department's list of facilities offering this level of care within the geographic area requested by the patient (or if unable, by the patient's family).  Yes   Patient/family informed of their freedom to choose among providers that offer the needed level of care, that participate in Medicare, Medicaid or managed care program needed by the patient, have an available bed and are willing to accept the patient.      Patient/family informed of [facility]'s ownership interest in [facility] and [facility], as well as of the fact that they are under no obligation to receive care at these facilities.  PASRR submitted to EDS on 11/05/16 (Per notes)     PASRR number received on 11/05/16 (Per notes)     Existing PASRR number confirmed on       FL2 transmitted to all facilities in geographic area requested by pt/family on       FL2 transmitted to all facilities within larger geographic area on 11/05/16     Patient informed that his/her managed care company has contracts with or will negotiate with certain facilities, including the following:        Yes   Patient/family informed of bed offers received.  Patient chooses bed at [facility]     Physician recommends and patient chooses bed at      Patient to be transferred to [facility] on 11/08/16.  Patient to be transferred to facility by [facility]     Patient family notified on 11/08/16 of transfer.  Name of family member notified:      [name] [phone]  PHYSICIAN  (please addend  and e-sign D/C summary to be sent to facility)     Additional Comment:    _______________________________________________ Claudine Mouton, LCSWA 11/08/2016, 11:55 AM

## 2016-11-08 NOTE — Progress Notes (Addendum)
Provider updated D/C summary, CSW sent updated D/C summary via the hub and called [facility].  Please reconsult if future social work needs arise.    3:03 PM CSW updated pt and RN  Alphonse Guild. Daionna Crossland, [place], LCAS Clinical Social Worker Ph: [phone]

## 2016-11-08 NOTE — Progress Notes (Signed)
Pt seen and examined, s/p right hip hemiarthroplasty for Hip fracture -mild asymptomatic hyponatremia likely from HCTZ-now stopped, improving -no labs today but looks stable and eating and drinking, mentating well -No changes to DC summary from 5/27  Domenic Polite, MD

## 2016-11-08 NOTE — Progress Notes (Addendum)
CSW received a call from [place] at [facility] Pt has been accepted by: [facility] Number for report is: [phone] ask for Mariann Laster Pt's room/bed number will be: 3213 Pt's unit will be: [facility] Accepting physician: SNF MD  Pt can arrive ASAP on 11/08/16 after 2pm  CSW will update RN and EDP and pt and family.  1:37 PM CSW awaiting updated D/C summary from provider dated 11/08/16 (day of D/C), as required by facility.  Provider has been paged, CSW updated RN.  Alphonse Guild. Eliyah Bazzi, [place], LCAS Clinical Social Worker Ph: [phone]

## 2016-11-08 NOTE — Progress Notes (Signed)
Call placed to social worker to follow up on status of discharge to [facility]. SW to check status and notify RN.

## 2016-11-10 DIAGNOSIS — N184 Chronic kidney disease, stage 4 (severe): Secondary | ICD-10-CM | POA: Diagnosis not present

## 2016-11-10 DIAGNOSIS — E118 Type 2 diabetes mellitus with unspecified complications: Secondary | ICD-10-CM | POA: Diagnosis not present

## 2016-11-10 DIAGNOSIS — I10 Essential (primary) hypertension: Secondary | ICD-10-CM | POA: Diagnosis not present

## 2016-11-10 DIAGNOSIS — S72001D Fracture of unspecified part of neck of right femur, subsequent encounter for closed fracture with routine healing: Secondary | ICD-10-CM | POA: Diagnosis not present

## 2016-11-12 DIAGNOSIS — N184 Chronic kidney disease, stage 4 (severe): Secondary | ICD-10-CM | POA: Diagnosis not present

## 2016-11-12 DIAGNOSIS — I10 Essential (primary) hypertension: Secondary | ICD-10-CM | POA: Diagnosis not present

## 2016-11-12 DIAGNOSIS — S72001D Fracture of unspecified part of neck of right femur, subsequent encounter for closed fracture with routine healing: Secondary | ICD-10-CM | POA: Diagnosis not present

## 2016-11-12 DIAGNOSIS — E118 Type 2 diabetes mellitus with unspecified complications: Secondary | ICD-10-CM | POA: Diagnosis not present

## 2016-11-16 DIAGNOSIS — E1129 Type 2 diabetes mellitus with other diabetic kidney complication: Secondary | ICD-10-CM | POA: Diagnosis not present

## 2016-11-16 DIAGNOSIS — E784 Other hyperlipidemia: Secondary | ICD-10-CM | POA: Diagnosis not present

## 2016-11-16 DIAGNOSIS — M1990 Unspecified osteoarthritis, unspecified site: Secondary | ICD-10-CM | POA: Diagnosis not present

## 2016-11-16 DIAGNOSIS — K219 Gastro-esophageal reflux disease without esophagitis: Secondary | ICD-10-CM | POA: Diagnosis not present

## 2016-11-16 DIAGNOSIS — N184 Chronic kidney disease, stage 4 (severe): Secondary | ICD-10-CM | POA: Diagnosis not present

## 2016-11-16 DIAGNOSIS — S72009D Fracture of unspecified part of neck of unspecified femur, subsequent encounter for closed fracture with routine healing: Secondary | ICD-10-CM | POA: Diagnosis not present

## 2016-11-16 DIAGNOSIS — I10 Essential (primary) hypertension: Secondary | ICD-10-CM | POA: Diagnosis not present

## 2016-11-17 DIAGNOSIS — E118 Type 2 diabetes mellitus with unspecified complications: Secondary | ICD-10-CM | POA: Diagnosis not present

## 2016-11-17 DIAGNOSIS — I10 Essential (primary) hypertension: Secondary | ICD-10-CM | POA: Diagnosis not present

## 2016-11-17 DIAGNOSIS — S72001D Fracture of unspecified part of neck of right femur, subsequent encounter for closed fracture with routine healing: Secondary | ICD-10-CM | POA: Diagnosis not present

## 2016-11-17 DIAGNOSIS — N184 Chronic kidney disease, stage 4 (severe): Secondary | ICD-10-CM | POA: Diagnosis not present

## 2016-11-19 DIAGNOSIS — S72091D Other fracture of head and neck of right femur, subsequent encounter for closed fracture with routine healing: Secondary | ICD-10-CM | POA: Diagnosis not present

## 2016-11-23 DIAGNOSIS — R2689 Other abnormalities of gait and mobility: Secondary | ICD-10-CM | POA: Diagnosis not present

## 2016-11-23 DIAGNOSIS — I10 Essential (primary) hypertension: Secondary | ICD-10-CM | POA: Diagnosis not present

## 2016-11-23 DIAGNOSIS — E1122 Type 2 diabetes mellitus with diabetic chronic kidney disease: Secondary | ICD-10-CM | POA: Diagnosis not present

## 2016-11-23 DIAGNOSIS — M6281 Muscle weakness (generalized): Secondary | ICD-10-CM | POA: Diagnosis not present

## 2016-11-23 DIAGNOSIS — N184 Chronic kidney disease, stage 4 (severe): Secondary | ICD-10-CM | POA: Diagnosis not present

## 2016-11-23 DIAGNOSIS — R278 Other lack of coordination: Secondary | ICD-10-CM | POA: Diagnosis not present

## 2016-11-24 DIAGNOSIS — E1122 Type 2 diabetes mellitus with diabetic chronic kidney disease: Secondary | ICD-10-CM | POA: Diagnosis not present

## 2016-11-24 DIAGNOSIS — R2689 Other abnormalities of gait and mobility: Secondary | ICD-10-CM | POA: Diagnosis not present

## 2016-11-24 DIAGNOSIS — M6281 Muscle weakness (generalized): Secondary | ICD-10-CM | POA: Diagnosis not present

## 2016-11-24 DIAGNOSIS — R278 Other lack of coordination: Secondary | ICD-10-CM | POA: Diagnosis not present

## 2016-11-24 DIAGNOSIS — N184 Chronic kidney disease, stage 4 (severe): Secondary | ICD-10-CM | POA: Diagnosis not present

## 2016-11-25 DIAGNOSIS — I10 Essential (primary) hypertension: Secondary | ICD-10-CM | POA: Diagnosis not present

## 2016-11-25 DIAGNOSIS — Z6824 Body mass index (BMI) 24.0-24.9, adult: Secondary | ICD-10-CM | POA: Diagnosis not present

## 2016-11-25 DIAGNOSIS — N184 Chronic kidney disease, stage 4 (severe): Secondary | ICD-10-CM | POA: Diagnosis not present

## 2016-11-25 DIAGNOSIS — E1129 Type 2 diabetes mellitus with other diabetic kidney complication: Secondary | ICD-10-CM | POA: Diagnosis not present

## 2016-11-25 DIAGNOSIS — S7291XA Unspecified fracture of right femur, initial encounter for closed fracture: Secondary | ICD-10-CM | POA: Diagnosis not present

## 2016-11-25 DIAGNOSIS — Z7689 Persons encountering health services in other specified circumstances: Secondary | ICD-10-CM | POA: Diagnosis not present

## 2016-11-26 DIAGNOSIS — N184 Chronic kidney disease, stage 4 (severe): Secondary | ICD-10-CM | POA: Diagnosis not present

## 2016-11-26 DIAGNOSIS — E1122 Type 2 diabetes mellitus with diabetic chronic kidney disease: Secondary | ICD-10-CM | POA: Diagnosis not present

## 2016-11-26 DIAGNOSIS — R2689 Other abnormalities of gait and mobility: Secondary | ICD-10-CM | POA: Diagnosis not present

## 2016-11-26 DIAGNOSIS — R278 Other lack of coordination: Secondary | ICD-10-CM | POA: Diagnosis not present

## 2016-11-26 DIAGNOSIS — M6281 Muscle weakness (generalized): Secondary | ICD-10-CM | POA: Diagnosis not present

## 2016-11-30 DIAGNOSIS — R278 Other lack of coordination: Secondary | ICD-10-CM | POA: Diagnosis not present

## 2016-11-30 DIAGNOSIS — N184 Chronic kidney disease, stage 4 (severe): Secondary | ICD-10-CM | POA: Diagnosis not present

## 2016-11-30 DIAGNOSIS — M6281 Muscle weakness (generalized): Secondary | ICD-10-CM | POA: Diagnosis not present

## 2016-11-30 DIAGNOSIS — R2689 Other abnormalities of gait and mobility: Secondary | ICD-10-CM | POA: Diagnosis not present

## 2016-11-30 DIAGNOSIS — E1122 Type 2 diabetes mellitus with diabetic chronic kidney disease: Secondary | ICD-10-CM | POA: Diagnosis not present

## 2016-12-03 DIAGNOSIS — R2689 Other abnormalities of gait and mobility: Secondary | ICD-10-CM | POA: Diagnosis not present

## 2016-12-03 DIAGNOSIS — R278 Other lack of coordination: Secondary | ICD-10-CM | POA: Diagnosis not present

## 2016-12-03 DIAGNOSIS — M6281 Muscle weakness (generalized): Secondary | ICD-10-CM | POA: Diagnosis not present

## 2016-12-03 DIAGNOSIS — E1122 Type 2 diabetes mellitus with diabetic chronic kidney disease: Secondary | ICD-10-CM | POA: Diagnosis not present

## 2016-12-03 DIAGNOSIS — N184 Chronic kidney disease, stage 4 (severe): Secondary | ICD-10-CM | POA: Diagnosis not present

## 2016-12-07 DIAGNOSIS — N184 Chronic kidney disease, stage 4 (severe): Secondary | ICD-10-CM | POA: Diagnosis not present

## 2016-12-07 DIAGNOSIS — M6281 Muscle weakness (generalized): Secondary | ICD-10-CM | POA: Diagnosis not present

## 2016-12-07 DIAGNOSIS — R278 Other lack of coordination: Secondary | ICD-10-CM | POA: Diagnosis not present

## 2016-12-07 DIAGNOSIS — R2689 Other abnormalities of gait and mobility: Secondary | ICD-10-CM | POA: Diagnosis not present

## 2016-12-07 DIAGNOSIS — E1122 Type 2 diabetes mellitus with diabetic chronic kidney disease: Secondary | ICD-10-CM | POA: Diagnosis not present

## 2016-12-08 DIAGNOSIS — M6281 Muscle weakness (generalized): Secondary | ICD-10-CM | POA: Diagnosis not present

## 2016-12-08 DIAGNOSIS — E1122 Type 2 diabetes mellitus with diabetic chronic kidney disease: Secondary | ICD-10-CM | POA: Diagnosis not present

## 2016-12-08 DIAGNOSIS — R278 Other lack of coordination: Secondary | ICD-10-CM | POA: Diagnosis not present

## 2016-12-08 DIAGNOSIS — R2689 Other abnormalities of gait and mobility: Secondary | ICD-10-CM | POA: Diagnosis not present

## 2016-12-08 DIAGNOSIS — N184 Chronic kidney disease, stage 4 (severe): Secondary | ICD-10-CM | POA: Diagnosis not present

## 2016-12-15 DIAGNOSIS — N184 Chronic kidney disease, stage 4 (severe): Secondary | ICD-10-CM | POA: Diagnosis not present

## 2016-12-15 DIAGNOSIS — Z6824 Body mass index (BMI) 24.0-24.9, adult: Secondary | ICD-10-CM | POA: Diagnosis not present

## 2016-12-15 DIAGNOSIS — I10 Essential (primary) hypertension: Secondary | ICD-10-CM | POA: Diagnosis not present

## 2016-12-15 DIAGNOSIS — E1129 Type 2 diabetes mellitus with other diabetic kidney complication: Secondary | ICD-10-CM | POA: Diagnosis not present

## 2016-12-22 DIAGNOSIS — S72091D Other fracture of head and neck of right femur, subsequent encounter for closed fracture with routine healing: Secondary | ICD-10-CM | POA: Diagnosis not present

## 2016-12-30 ENCOUNTER — Telehealth: Payer: Self-pay | Admitting: Cardiovascular Disease

## 2016-12-30 NOTE — Telephone Encounter (Signed)
Received records from [facility] for appointment on 02/09/17 with Dr Oval Linsey.  Records put with Dr Blenda Mounts schedule for 02/09/17. lp

## 2017-01-06 ENCOUNTER — Encounter: Payer: Self-pay | Admitting: Cardiovascular Disease

## 2017-01-06 ENCOUNTER — Ambulatory Visit (INDEPENDENT_AMBULATORY_CARE_PROVIDER_SITE_OTHER): Payer: Medicare HMO | Admitting: Cardiovascular Disease

## 2017-01-06 VITALS — BP 158/90 | HR 78 | Wt 150.0 lb

## 2017-01-06 DIAGNOSIS — I10 Essential (primary) hypertension: Secondary | ICD-10-CM | POA: Diagnosis not present

## 2017-01-06 DIAGNOSIS — R0609 Other forms of dyspnea: Secondary | ICD-10-CM

## 2017-01-06 DIAGNOSIS — E7800 Pure hypercholesterolemia, unspecified: Secondary | ICD-10-CM | POA: Diagnosis not present

## 2017-01-06 DIAGNOSIS — R0600 Dyspnea, unspecified: Secondary | ICD-10-CM

## 2017-01-06 DIAGNOSIS — E785 Hyperlipidemia, unspecified: Secondary | ICD-10-CM | POA: Insufficient documentation

## 2017-01-06 NOTE — Addendum Note (Signed)
Addended by: Therisa Doyne on: 01/06/2017 11:19 AM   Modules accepted: Orders

## 2017-01-06 NOTE — Assessment & Plan Note (Signed)
History of hyperlipidemia on statin therapy followed by her PCP. 

## 2017-01-06 NOTE — Progress Notes (Signed)
01/06/2017 Abigail Huang   Aug 09, 1930  [id number]  Primary Physician Abigail Bunting, MD Primary Cardiologist: Abigail Harp MD Abigail Huang  HPI:  Abigail Huang is a very pleasant 83 year old mildly overweight married Caucasian female mother of 3 children, grandmother and 5 grandchildren who is accompanied by her daughter Abigail Huang today. She was referred by her PCP, Dr. Reynaldo Huang, for cardiovascular evaluation because of new onset dyspnea on exertion. Risk factors include treated hypertension, diabetes and hyperlipidemia. She does have a family history of heart disease with a father who died of a myocardial infarction, a sister and son both of whom have stents. She has never had a heart attack or stroke. She denies chest pain but was complaining of some dyspnea on exertion while singing at church walking to the bathroom most likely related to excessive water intake in retrospect. This also was resulting in lower extremity edema feet. These symptoms have all resolved since stopping her water intake.   Current Outpatient Prescriptions  Medication Sig Dispense Refill  . amLODipine (NORVASC) 5 MG tablet Take 1 tablet (5 mg total) by mouth daily. 30 tablet 0  . aspirin EC 325 MG tablet Take 1 tablet (325 mg total) by mouth 2 (two) times daily after a meal. Take x 1 month post op to decrease risk of blood clots. 60 tablet 0  . atenolol (TENORMIN) 50 MG tablet Take 50 mg by mouth daily.     . Calcium Carbonate-Vitamin D (CALTRATE 600+D PO) Take 1 tablet by mouth daily.     Marland Kitchen docusate sodium (COLACE) 100 MG capsule Take 1 capsule (100 mg total) by mouth 2 (two) times daily. 10 capsule 0  . estrogens, conjugated, (PREMARIN) 0.3 MG tablet Take 0.3 mg by mouth daily. Take daily for 21 days then do not take for 7 days.    . feeding supplement, GLUCERNA SHAKE, (GLUCERNA SHAKE) LIQD Take 237 mLs by mouth 2 (two) times daily between meals.  0  . glipiZIDE (GLUCOTROL) 10 MG 24 hr tablet Take 10  mg by mouth daily with breakfast.     . HYDROcodone-acetaminophen (NORCO/VICODIN) 5-325 MG tablet Take 1-2 tablets by mouth every 6 (six) hours as needed for moderate pain. 50 tablet 0  . insulin detemir (LEVEMIR) 100 UNIT/ML injection Inject 10 Units into the skin daily.    . Multiple Vitamins-Minerals (CENTRUM SILVER PO) Take 1 tablet by mouth daily.    . niacin 500 MG tablet Take 500 mg by mouth daily.    . nortriptyline (PAMELOR) 25 MG capsule Take 25 mg by mouth daily.    . simvastatin (ZOCOR) 20 MG tablet Take 20 mg by mouth daily.     No current facility-administered medications for this visit.     No Known Allergies  Social History   Social History  . Marital status: Married    Spouse name: N/A  . Number of children: N/A  . Years of education: N/A   Occupational History  . Not on file.   Social History Main Topics  . Smoking status: Former Smoker    Quit date: 06/27/1967  . Smokeless tobacco: Never Used  . Alcohol use No  . Drug use: No  . Sexual activity: Not on file   Other Topics Concern  . Not on file   Social History Narrative  . No narrative on file     Review of Systems: General: negative for chills, fever, night sweats or weight changes.  Cardiovascular: negative for  chest pain, dyspnea on exertion, edema, orthopnea, palpitations, paroxysmal nocturnal dyspnea or shortness of breath Dermatological: negative for rash Respiratory: negative for cough or wheezing Urologic: negative for hematuria Abdominal: negative for nausea, vomiting, diarrhea, bright red blood per rectum, melena, or hematemesis Neurologic: negative for visual changes, syncope, or dizziness All other systems reviewed and are otherwise negative except as noted above.    Blood pressure (!) 158/90, pulse 78, weight 150 lb (68 kg).  General appearance: alert and no distress Neck: no adenopathy, no carotid bruit, no JVD, supple, symmetrical, trachea midline and thyroid not enlarged,  symmetric, no tenderness/mass/nodules Lungs: clear to auscultation bilaterally Heart: regular rate and rhythm, S1, S2 normal, no murmur, click, rub or gallop Extremities: Trace bilateral ankle edema.  EKG sinus rhythm at 78 with marked sinus arrhythmia with septal Q waves and voltage criteria for LVH. I personally reviewed this EKG.  ASSESSMENT AND PLAN:   Essential hypertension History of essential hypertension blood pressure measured today at 158/90. She is on amlodipine and atenolol.. Continue current meds at current dose  Dyspnea on exertion Ms. Zani was referred to me by Dr. Reynaldo Huang for new onset dyspnea on exertion over the last several weeks which has since resolved. She does have chronic renal insufficiency and was taking an excessive amount of water which resulted in her symptoms. This has since improved after discontinuing her excessive water intake. I am going to get a 2-D echocardiogram to further evaluate.  Hyperlipidemia History of hyperlipidemia on statin therapy followed by her PCP      Abigail Harp MD [facility], [facility] 01/06/2017 11:10 AM

## 2017-01-06 NOTE — Patient Instructions (Signed)
Medication Instructions: Your physician recommends that you continue on your current medications as directed. Please refer to the Current Medication list given to you today.   Testing/Procedures: Your physician has requested that you have an echocardiogram. Echocardiography is a painless test that uses sound waves to create images of your heart. It provides your doctor with information about the size and shape of your heart and how well your heart's chambers and valves are working. This procedure takes approximately one hour. There are no restrictions for this procedure.  Follow-Up: We request that you follow-up in: 6 months with an extender and in 12 months with Dr Berry  You will receive a reminder letter in the mail two months in advance. If you don't receive a letter, please call our office to schedule the follow-up appointment.  If you need a refill on your cardiac medications before your next appointment, please call your pharmacy.  

## 2017-01-06 NOTE — Assessment & Plan Note (Signed)
Ms. Abigail Huang was referred to me by Dr. Reynaldo Minium for new onset dyspnea on exertion over the last several weeks which has since resolved. She does have chronic renal insufficiency and was taking an excessive amount of water which resulted in her symptoms. This has since improved after discontinuing her excessive water intake. I am going to get a 2-D echocardiogram to further evaluate.

## 2017-01-06 NOTE — Assessment & Plan Note (Signed)
History of essential hypertension blood pressure measured today at 158/90. She is on amlodipine and atenolol.. Continue current meds at current dose

## 2017-01-11 ENCOUNTER — Other Ambulatory Visit: Payer: Self-pay

## 2017-01-11 ENCOUNTER — Ambulatory Visit (HOSPITAL_COMMUNITY): Payer: Medicare HMO | Attending: Internal Medicine

## 2017-01-11 DIAGNOSIS — I420 Dilated cardiomyopathy: Secondary | ICD-10-CM | POA: Insufficient documentation

## 2017-01-11 DIAGNOSIS — I5030 Unspecified diastolic (congestive) heart failure: Secondary | ICD-10-CM | POA: Insufficient documentation

## 2017-01-11 DIAGNOSIS — I051 Rheumatic mitral insufficiency: Secondary | ICD-10-CM | POA: Insufficient documentation

## 2017-01-11 DIAGNOSIS — R0600 Dyspnea, unspecified: Secondary | ICD-10-CM

## 2017-01-11 NOTE — Progress Notes (Unsigned)
After the echocardiogram was completed, the patient stated that she was hot and that she felt like her blood sugar was dropping. I gave her a can of Coke to drink and after drinking it she stated that she was feeling a little better. I also gave her a can of Sprite and some graham crackers which she stated that she would sit in the waiting room with her husband and eat the crackers and drink the Sprite before leaving.

## 2017-01-12 ENCOUNTER — Ambulatory Visit: Payer: Medicare HMO | Admitting: Cardiovascular Disease

## 2017-01-19 DIAGNOSIS — H2512 Age-related nuclear cataract, left eye: Secondary | ICD-10-CM | POA: Diagnosis not present

## 2017-01-19 DIAGNOSIS — H40013 Open angle with borderline findings, low risk, bilateral: Secondary | ICD-10-CM | POA: Diagnosis not present

## 2017-02-09 ENCOUNTER — Ambulatory Visit: Payer: Medicare HMO | Admitting: Cardiovascular Disease

## 2017-02-09 DIAGNOSIS — S72091D Other fracture of head and neck of right femur, subsequent encounter for closed fracture with routine healing: Secondary | ICD-10-CM | POA: Diagnosis not present

## 2017-02-22 DIAGNOSIS — I10 Essential (primary) hypertension: Secondary | ICD-10-CM | POA: Diagnosis not present

## 2017-02-22 DIAGNOSIS — K219 Gastro-esophageal reflux disease without esophagitis: Secondary | ICD-10-CM | POA: Diagnosis not present

## 2017-02-22 DIAGNOSIS — N184 Chronic kidney disease, stage 4 (severe): Secondary | ICD-10-CM | POA: Diagnosis not present

## 2017-02-22 DIAGNOSIS — Z6824 Body mass index (BMI) 24.0-24.9, adult: Secondary | ICD-10-CM | POA: Diagnosis not present

## 2017-02-22 DIAGNOSIS — R609 Edema, unspecified: Secondary | ICD-10-CM | POA: Diagnosis not present

## 2017-02-22 DIAGNOSIS — E784 Other hyperlipidemia: Secondary | ICD-10-CM | POA: Diagnosis not present

## 2017-02-22 DIAGNOSIS — Z23 Encounter for immunization: Secondary | ICD-10-CM | POA: Diagnosis not present

## 2017-02-22 DIAGNOSIS — M1990 Unspecified osteoarthritis, unspecified site: Secondary | ICD-10-CM | POA: Diagnosis not present

## 2017-02-22 DIAGNOSIS — E1129 Type 2 diabetes mellitus with other diabetic kidney complication: Secondary | ICD-10-CM | POA: Diagnosis not present

## 2017-02-22 DIAGNOSIS — I872 Venous insufficiency (chronic) (peripheral): Secondary | ICD-10-CM | POA: Diagnosis not present

## 2017-03-29 DIAGNOSIS — E1129 Type 2 diabetes mellitus with other diabetic kidney complication: Secondary | ICD-10-CM | POA: Diagnosis not present

## 2017-03-29 DIAGNOSIS — Z6824 Body mass index (BMI) 24.0-24.9, adult: Secondary | ICD-10-CM | POA: Diagnosis not present

## 2017-03-29 DIAGNOSIS — M1990 Unspecified osteoarthritis, unspecified site: Secondary | ICD-10-CM | POA: Diagnosis not present

## 2017-03-29 DIAGNOSIS — R600 Localized edema: Secondary | ICD-10-CM | POA: Diagnosis not present

## 2017-03-29 DIAGNOSIS — K219 Gastro-esophageal reflux disease without esophagitis: Secondary | ICD-10-CM | POA: Diagnosis not present

## 2017-03-29 DIAGNOSIS — N184 Chronic kidney disease, stage 4 (severe): Secondary | ICD-10-CM | POA: Diagnosis not present

## 2017-03-29 DIAGNOSIS — I872 Venous insufficiency (chronic) (peripheral): Secondary | ICD-10-CM | POA: Diagnosis not present

## 2017-03-29 DIAGNOSIS — E7849 Other hyperlipidemia: Secondary | ICD-10-CM | POA: Diagnosis not present

## 2017-03-29 DIAGNOSIS — I10 Essential (primary) hypertension: Secondary | ICD-10-CM | POA: Diagnosis not present

## 2017-04-20 DIAGNOSIS — Z780 Asymptomatic menopausal state: Secondary | ICD-10-CM | POA: Diagnosis not present

## 2017-06-01 DIAGNOSIS — X32XXXD Exposure to sunlight, subsequent encounter: Secondary | ICD-10-CM | POA: Diagnosis not present

## 2017-06-01 DIAGNOSIS — L820 Inflamed seborrheic keratosis: Secondary | ICD-10-CM | POA: Diagnosis not present

## 2017-06-01 DIAGNOSIS — L570 Actinic keratosis: Secondary | ICD-10-CM | POA: Diagnosis not present

## 2017-08-02 DIAGNOSIS — Z6824 Body mass index (BMI) 24.0-24.9, adult: Secondary | ICD-10-CM | POA: Diagnosis not present

## 2017-08-02 DIAGNOSIS — E1129 Type 2 diabetes mellitus with other diabetic kidney complication: Secondary | ICD-10-CM | POA: Diagnosis not present

## 2017-08-02 DIAGNOSIS — I872 Venous insufficiency (chronic) (peripheral): Secondary | ICD-10-CM | POA: Diagnosis not present

## 2017-08-02 DIAGNOSIS — I10 Essential (primary) hypertension: Secondary | ICD-10-CM | POA: Diagnosis not present

## 2017-08-02 DIAGNOSIS — I8310 Varicose veins of unspecified lower extremity with inflammation: Secondary | ICD-10-CM | POA: Diagnosis not present

## 2017-08-02 DIAGNOSIS — E7849 Other hyperlipidemia: Secondary | ICD-10-CM | POA: Diagnosis not present

## 2017-08-02 DIAGNOSIS — K219 Gastro-esophageal reflux disease without esophagitis: Secondary | ICD-10-CM | POA: Diagnosis not present

## 2017-08-02 DIAGNOSIS — R609 Edema, unspecified: Secondary | ICD-10-CM | POA: Diagnosis not present

## 2017-08-02 DIAGNOSIS — N184 Chronic kidney disease, stage 4 (severe): Secondary | ICD-10-CM | POA: Diagnosis not present

## 2017-08-12 ENCOUNTER — Other Ambulatory Visit: Payer: Self-pay | Admitting: Internal Medicine

## 2017-08-12 DIAGNOSIS — Z1231 Encounter for screening mammogram for malignant neoplasm of breast: Secondary | ICD-10-CM

## 2017-09-07 DIAGNOSIS — L820 Inflamed seborrheic keratosis: Secondary | ICD-10-CM | POA: Diagnosis not present

## 2017-09-07 DIAGNOSIS — X32XXXD Exposure to sunlight, subsequent encounter: Secondary | ICD-10-CM | POA: Diagnosis not present

## 2017-09-07 DIAGNOSIS — L570 Actinic keratosis: Secondary | ICD-10-CM | POA: Diagnosis not present

## 2017-09-07 DIAGNOSIS — C44319 Basal cell carcinoma of skin of other parts of face: Secondary | ICD-10-CM | POA: Diagnosis not present

## 2017-09-22 ENCOUNTER — Ambulatory Visit
Admission: RE | Admit: 2017-09-22 | Discharge: 2017-09-22 | Disposition: A | Discharge status: Home / Self Care | Payer: Medicare HMO | Source: Ambulatory Visit | Attending: Internal Medicine | Admitting: Internal Medicine

## 2017-09-22 ENCOUNTER — Ambulatory Visit: Payer: Medicare HMO

## 2017-09-22 DIAGNOSIS — Z1231 Encounter for screening mammogram for malignant neoplasm of breast: Secondary | ICD-10-CM | POA: Diagnosis not present

## 2017-09-25 ENCOUNTER — Other Ambulatory Visit: Payer: Self-pay

## 2017-09-25 ENCOUNTER — Emergency Department (HOSPITAL_BASED_OUTPATIENT_CLINIC_OR_DEPARTMENT_OTHER): Payer: Medicare HMO

## 2017-09-25 ENCOUNTER — Encounter (HOSPITAL_BASED_OUTPATIENT_CLINIC_OR_DEPARTMENT_OTHER): Payer: Self-pay | Admitting: Emergency Medicine

## 2017-09-25 ENCOUNTER — Emergency Department (HOSPITAL_BASED_OUTPATIENT_CLINIC_OR_DEPARTMENT_OTHER)
Admission: EM | Admit: 2017-09-25 | Discharge: 2017-09-25 | Disposition: A | Discharge status: Home / Self Care | Payer: Medicare HMO | Attending: Emergency Medicine | Admitting: Emergency Medicine

## 2017-09-25 DIAGNOSIS — S0003XA Contusion of scalp, initial encounter: Secondary | ICD-10-CM

## 2017-09-25 DIAGNOSIS — Y929 Unspecified place or not applicable: Secondary | ICD-10-CM | POA: Insufficient documentation

## 2017-09-25 DIAGNOSIS — R51 Headache: Secondary | ICD-10-CM | POA: Diagnosis not present

## 2017-09-25 DIAGNOSIS — Z7982 Long term (current) use of aspirin: Secondary | ICD-10-CM | POA: Diagnosis not present

## 2017-09-25 DIAGNOSIS — S90812A Abrasion, left foot, initial encounter: Secondary | ICD-10-CM | POA: Diagnosis not present

## 2017-09-25 DIAGNOSIS — Y9301 Activity, walking, marching and hiking: Secondary | ICD-10-CM | POA: Diagnosis not present

## 2017-09-25 DIAGNOSIS — W109XXA Fall (on) (from) unspecified stairs and steps, initial encounter: Secondary | ICD-10-CM | POA: Insufficient documentation

## 2017-09-25 DIAGNOSIS — I129 Hypertensive chronic kidney disease with stage 1 through stage 4 chronic kidney disease, or unspecified chronic kidney disease: Secondary | ICD-10-CM | POA: Diagnosis not present

## 2017-09-25 DIAGNOSIS — N184 Chronic kidney disease, stage 4 (severe): Secondary | ICD-10-CM | POA: Diagnosis not present

## 2017-09-25 DIAGNOSIS — Z79899 Other long term (current) drug therapy: Secondary | ICD-10-CM | POA: Diagnosis not present

## 2017-09-25 DIAGNOSIS — E1122 Type 2 diabetes mellitus with diabetic chronic kidney disease: Secondary | ICD-10-CM | POA: Diagnosis not present

## 2017-09-25 DIAGNOSIS — Z794 Long term (current) use of insulin: Secondary | ICD-10-CM | POA: Diagnosis not present

## 2017-09-25 DIAGNOSIS — Z87891 Personal history of nicotine dependence: Secondary | ICD-10-CM | POA: Diagnosis not present

## 2017-09-25 DIAGNOSIS — Y999 Unspecified external cause status: Secondary | ICD-10-CM | POA: Insufficient documentation

## 2017-09-25 DIAGNOSIS — S0990XA Unspecified injury of head, initial encounter: Secondary | ICD-10-CM | POA: Diagnosis present

## 2017-09-25 MED ORDER — BACITRACIN ZINC 500 UNIT/GM EX OINT: TOPICAL_OINTMENT | Freq: Two times a day (BID) | CUTANEOUS | Status: DC

## 2017-09-25 MED ORDER — CEPHALEXIN 500 MG PO CAPS: 500.0000 mg | ORAL_CAPSULE | Freq: Four times a day (QID) | ORAL | 0 refills | Status: DC

## 2017-09-25 NOTE — Discharge Instructions (Signed)
Do not take Keflex unless you see some redness or swelling around foot abrasion.  If your wound gets infected and the Keflex is not helping, return to the ED.

## 2017-09-25 NOTE — ED Provider Notes (Signed)
[facility] EMERGENCY DEPARTMENT Provider Note   CSN: [id number] Arrival date & time: 09/25/17  1515     History   Chief Complaint Chief Complaint  Patient presents with  . Fall    HPI Abigail Huang is a 84 y.o. female.  Pt presents to the ED today s/p fall.  Pt has a hx of diabetic neuropathy in her feet and does not feel them very well.  She was walking down some stairs and tripped, falling on the pavement.  No loc.  Family witnessed fall.  The pt scratched her left foot, but no other injuries.  Tetanus is UTD.     Past Medical History:  Diagnosis Date  . Diabetes mellitus   . Hyperlipidemia   . Hypertension     Patient Active Problem List   Diagnosis Date Noted  . Essential hypertension 01/06/2017  . Dyspnea on exertion 01/06/2017  . Hyperlipidemia 01/06/2017  . Closed displaced fracture of right femoral neck ([place]) 11/03/2016  . Type 2 diabetes mellitus with stage 4 chronic kidney disease, with long-term current use of insulin ([place]) 11/03/2016  . Hyponatremia 11/03/2016  . Leukocytosis 11/03/2016  . Normocytic anemia 11/03/2016  . CKD (chronic kidney disease), stage IV ([place]) 11/03/2016    Past Surgical History:  Procedure Laterality Date  . ABDOMINAL HYSTERECTOMY    . ANTERIOR APPROACH HEMI HIP ARTHROPLASTY Right 11/04/2016   Procedure: ANTERIOR APPROACH HEMI HIP ARTHROPLASTY;  Surgeon: Dorna Leitz, MD;  Location: [facility];  Service: Orthopedics;  Laterality: Right;  . COLONOSCOPY    . POLYPECTOMY       OB History   None      Home Medications    Prior to Admission medications   Medication Sig Start Date End Date Taking? Authorizing Provider  amLODipine (NORVASC) 5 MG tablet Take 1 tablet (5 mg total) by mouth daily. 11/08/16   Hosie Poisson, MD  aspirin EC 325 MG tablet Take 1 tablet (325 mg total) by mouth 2 (two) times daily after a meal. Take x 1 month post op to decrease risk of blood clots. 11/04/16   Gary Fleet, PA-C  atenolol  (TENORMIN) 50 MG tablet Take 50 mg by mouth daily.     [provider]  Calcium Carbonate-Vitamin D (CALTRATE 600+D PO) Take 1 tablet by mouth daily.     [provider]  cephALEXin (KEFLEX) 500 MG capsule Take 1 capsule (500 mg total) by mouth 4 (four) times daily. 09/25/17   Isla Pence, MD  glipiZIDE (GLUCOTROL) 10 MG 24 hr tablet Take 10 mg by mouth daily with breakfast.     [provider]  insulin detemir (LEVEMIR) 100 UNIT/ML injection Inject 10 Units into the skin daily.    [provider]  Multiple Vitamins-Minerals (CENTRUM SILVER PO) Take 1 tablet by mouth daily.    [provider]  simvastatin (ZOCOR) 20 MG tablet Take 20 mg by mouth daily.    [provider]    Family History Family History  Problem Relation Age of Onset  . Heart disease Mother   . Heart disease Father   . Heart disease Sister     Social History Social History   Tobacco Use  . Smoking status: Former Smoker    Last attempt to quit: 06/27/1967    Years since quitting: 50.2  . Smokeless tobacco: Never Used  Substance Use Topics  . Alcohol use: No  . Drug use: No     Allergies   Patient  has no known allergies.   Review of Systems Review of Systems  Skin: Positive for wound.  Neurological: Positive for headaches.  All other systems reviewed and are negative.    Physical Exam Updated Vital Signs BP (!) 186/80 (BP Location: Right Arm)   Pulse 72   Temp 98.1 F (36.7 C) (Oral)   Resp 16   Ht 5\' 6"  (1.676 m)   Wt 68.5 kg (151 lb)   SpO2 100%   BMI 24.37 kg/m   Physical Exam  Constitutional: She is oriented to person, place, and time. She appears well-developed and well-nourished.  HENT:  Head: Normocephalic.    Right Ear: External ear normal.  Left Ear: External ear normal.  Nose: Nose normal.  Mouth/Throat: Oropharynx is clear and moist.  Eyes: Pupils are equal, round, and reactive to light. Conjunctivae and EOM are normal.    Neck: Normal range of motion. Neck supple.  Cardiovascular: Normal rate, regular rhythm, normal heart sounds and intact distal pulses.  Pulmonary/Chest: Effort normal and breath sounds normal.  Abdominal: Soft. Bowel sounds are normal.  Musculoskeletal: Normal range of motion.  Neurological: She is alert and oriented to person, place, and time.  Skin: Skin is warm. Capillary refill takes less than 2 seconds.     Psychiatric: She has a normal mood and affect. Her behavior is normal. Judgment and thought content normal.  Nursing note and vitals reviewed.    ED Treatments / Results  Labs (all labs ordered are listed, but only abnormal results are displayed) Labs Reviewed - No data to display  EKG None  Radiology Ct Head Wo Contrast  Result Date: 09/25/2017 CLINICAL DATA:  Posttraumatic headache, tripped coming down some stairs and fell striking head on pavement, denies loss of consciousness, complaining of head pain, history hypertension EXAM: CT HEAD WITHOUT CONTRAST TECHNIQUE: Contiguous axial images were obtained from the base of the skull through the vertex without intravenous contrast. Sagittal and coronal MPR images reconstructed from axial data set. COMPARISON:  05/20/2014 FINDINGS: Brain: Generalized atrophy. Normal ventricular morphology. No midline shift or mass effect. Small vessel chronic ischemic changes of deep cerebral white matter. No intracranial hemorrhage, mass lesion, evidence of acute infarction, or extra-axial fluid collection. Vascular: No hyperdense vessels. Skull: Intact calvaria. Posterior high LEFT parietal scalp hematoma. Sinuses/Orbits: Chronic subtotal opacification of the LEFT mastoid air cells. Other: N/A IMPRESSION: Atrophy with small vessel chronic ischemic changes of deep cerebral white matter. No acute intracranial abnormalities. Chronic LEFT mastoid effusion. Electronically Signed   By: Lavonia Dana M.D.   On: 09/25/2017 16:15    Procedures Procedures  (including critical care time)  Medications Ordered in ED Medications  bacitracin ointment (has no administration in time range)     Initial Impression / Assessment and Plan / ED Course  I have reviewed the triage vital signs and the nursing notes.  Pertinent labs & imaging results that were available during my care of the patient were reviewed by me and considered in my medical decision making (see chart for details).     Pt's ms is normal.  She has diabetes with poor foot circulation, so she is given rx for keflex to take if foot abrasion develops any redness.  Return if worse.  F/u with pcp.  Final Clinical Impressions(s) / ED Diagnoses   Final diagnoses:  Contusion of scalp, initial encounter  Foot abrasion, left, initial encounter    ED Discharge Orders        Ordered  cephALEXin (KEFLEX) 500 MG capsule  4 times daily     09/25/17 1632       Isla Pence, MD 09/25/17 [phone]

## 2017-09-25 NOTE — ED Triage Notes (Signed)
Pt tripped while coming down some stairs and fell, hitting her head on the pavement. Denies LOC. C/o head pain, denies other injury.

## 2017-09-29 DIAGNOSIS — Z794 Long term (current) use of insulin: Secondary | ICD-10-CM | POA: Diagnosis not present

## 2017-09-29 DIAGNOSIS — E1142 Type 2 diabetes mellitus with diabetic polyneuropathy: Secondary | ICD-10-CM | POA: Diagnosis not present

## 2017-09-29 DIAGNOSIS — E1151 Type 2 diabetes mellitus with diabetic peripheral angiopathy without gangrene: Secondary | ICD-10-CM | POA: Diagnosis not present

## 2017-09-29 DIAGNOSIS — G3184 Mild cognitive impairment, so stated: Secondary | ICD-10-CM | POA: Diagnosis not present

## 2017-09-29 DIAGNOSIS — I129 Hypertensive chronic kidney disease with stage 1 through stage 4 chronic kidney disease, or unspecified chronic kidney disease: Secondary | ICD-10-CM | POA: Diagnosis not present

## 2017-09-29 DIAGNOSIS — N184 Chronic kidney disease, stage 4 (severe): Secondary | ICD-10-CM | POA: Diagnosis not present

## 2017-09-29 DIAGNOSIS — K08109 Complete loss of teeth, unspecified cause, unspecified class: Secondary | ICD-10-CM | POA: Diagnosis not present

## 2017-09-29 DIAGNOSIS — E785 Hyperlipidemia, unspecified: Secondary | ICD-10-CM | POA: Diagnosis not present

## 2017-09-29 DIAGNOSIS — E1122 Type 2 diabetes mellitus with diabetic chronic kidney disease: Secondary | ICD-10-CM | POA: Diagnosis not present

## 2017-09-29 DIAGNOSIS — E559 Vitamin D deficiency, unspecified: Secondary | ICD-10-CM | POA: Diagnosis not present

## 2017-11-18 DIAGNOSIS — I872 Venous insufficiency (chronic) (peripheral): Secondary | ICD-10-CM | POA: Diagnosis not present

## 2017-11-18 DIAGNOSIS — Z6825 Body mass index (BMI) 25.0-25.9, adult: Secondary | ICD-10-CM | POA: Diagnosis not present

## 2017-11-18 DIAGNOSIS — R58 Hemorrhage, not elsewhere classified: Secondary | ICD-10-CM | POA: Diagnosis not present

## 2017-11-18 DIAGNOSIS — I10 Essential (primary) hypertension: Secondary | ICD-10-CM | POA: Diagnosis not present

## 2017-11-18 DIAGNOSIS — E1129 Type 2 diabetes mellitus with other diabetic kidney complication: Secondary | ICD-10-CM | POA: Diagnosis not present

## 2017-12-06 DIAGNOSIS — R82998 Other abnormal findings in urine: Secondary | ICD-10-CM | POA: Diagnosis not present

## 2017-12-06 DIAGNOSIS — I10 Essential (primary) hypertension: Secondary | ICD-10-CM | POA: Diagnosis not present

## 2017-12-06 DIAGNOSIS — E1129 Type 2 diabetes mellitus with other diabetic kidney complication: Secondary | ICD-10-CM | POA: Diagnosis not present

## 2017-12-06 DIAGNOSIS — E7849 Other hyperlipidemia: Secondary | ICD-10-CM | POA: Diagnosis not present

## 2017-12-13 DIAGNOSIS — I872 Venous insufficiency (chronic) (peripheral): Secondary | ICD-10-CM | POA: Diagnosis not present

## 2017-12-13 DIAGNOSIS — N184 Chronic kidney disease, stage 4 (severe): Secondary | ICD-10-CM | POA: Diagnosis not present

## 2017-12-13 DIAGNOSIS — I10 Essential (primary) hypertension: Secondary | ICD-10-CM | POA: Diagnosis not present

## 2017-12-13 DIAGNOSIS — M1990 Unspecified osteoarthritis, unspecified site: Secondary | ICD-10-CM | POA: Diagnosis not present

## 2017-12-13 DIAGNOSIS — E7849 Other hyperlipidemia: Secondary | ICD-10-CM | POA: Diagnosis not present

## 2017-12-13 DIAGNOSIS — E1129 Type 2 diabetes mellitus with other diabetic kidney complication: Secondary | ICD-10-CM | POA: Diagnosis not present

## 2017-12-13 DIAGNOSIS — Z0000 Encounter for general adult medical examination without abnormal findings: Secondary | ICD-10-CM | POA: Diagnosis not present

## 2017-12-13 DIAGNOSIS — Z6826 Body mass index (BMI) 26.0-26.9, adult: Secondary | ICD-10-CM | POA: Diagnosis not present

## 2017-12-13 DIAGNOSIS — Z1389 Encounter for screening for other disorder: Secondary | ICD-10-CM | POA: Diagnosis not present

## 2017-12-13 DIAGNOSIS — K219 Gastro-esophageal reflux disease without esophagitis: Secondary | ICD-10-CM | POA: Diagnosis not present

## 2017-12-20 DIAGNOSIS — Z1212 Encounter for screening for malignant neoplasm of rectum: Secondary | ICD-10-CM | POA: Diagnosis not present

## 2018-01-12 DIAGNOSIS — L570 Actinic keratosis: Secondary | ICD-10-CM | POA: Diagnosis not present

## 2018-01-12 DIAGNOSIS — Z08 Encounter for follow-up examination after completed treatment for malignant neoplasm: Secondary | ICD-10-CM | POA: Diagnosis not present

## 2018-01-12 DIAGNOSIS — Z85828 Personal history of other malignant neoplasm of skin: Secondary | ICD-10-CM | POA: Diagnosis not present

## 2018-01-12 DIAGNOSIS — X32XXXD Exposure to sunlight, subsequent encounter: Secondary | ICD-10-CM | POA: Diagnosis not present

## 2018-01-13 DIAGNOSIS — H40013 Open angle with borderline findings, low risk, bilateral: Secondary | ICD-10-CM | POA: Diagnosis not present

## 2018-01-13 DIAGNOSIS — H2512 Age-related nuclear cataract, left eye: Secondary | ICD-10-CM | POA: Diagnosis not present

## 2018-01-13 DIAGNOSIS — H524 Presbyopia: Secondary | ICD-10-CM | POA: Diagnosis not present

## 2018-03-14 DIAGNOSIS — R8290 Unspecified abnormal findings in urine: Secondary | ICD-10-CM | POA: Diagnosis not present

## 2018-03-14 DIAGNOSIS — N390 Urinary tract infection, site not specified: Secondary | ICD-10-CM | POA: Diagnosis not present

## 2018-04-25 DIAGNOSIS — I872 Venous insufficiency (chronic) (peripheral): Secondary | ICD-10-CM | POA: Diagnosis not present

## 2018-04-25 DIAGNOSIS — R300 Dysuria: Secondary | ICD-10-CM | POA: Diagnosis not present

## 2018-04-25 DIAGNOSIS — R946 Abnormal results of thyroid function studies: Secondary | ICD-10-CM | POA: Diagnosis not present

## 2018-04-25 DIAGNOSIS — N184 Chronic kidney disease, stage 4 (severe): Secondary | ICD-10-CM | POA: Diagnosis not present

## 2018-04-25 DIAGNOSIS — J302 Other seasonal allergic rhinitis: Secondary | ICD-10-CM | POA: Diagnosis not present

## 2018-04-25 DIAGNOSIS — I10 Essential (primary) hypertension: Secondary | ICD-10-CM | POA: Diagnosis not present

## 2018-04-25 DIAGNOSIS — E1129 Type 2 diabetes mellitus with other diabetic kidney complication: Secondary | ICD-10-CM | POA: Diagnosis not present

## 2018-04-25 DIAGNOSIS — E7849 Other hyperlipidemia: Secondary | ICD-10-CM | POA: Diagnosis not present

## 2018-04-25 DIAGNOSIS — M1990 Unspecified osteoarthritis, unspecified site: Secondary | ICD-10-CM | POA: Diagnosis not present

## 2018-04-25 DIAGNOSIS — K219 Gastro-esophageal reflux disease without esophagitis: Secondary | ICD-10-CM | POA: Diagnosis not present

## 2018-08-17 DIAGNOSIS — H5213 Myopia, bilateral: Secondary | ICD-10-CM | POA: Diagnosis not present

## 2018-08-17 DIAGNOSIS — H524 Presbyopia: Secondary | ICD-10-CM | POA: Diagnosis not present

## 2018-08-17 DIAGNOSIS — H52229 Regular astigmatism, unspecified eye: Secondary | ICD-10-CM | POA: Diagnosis not present

## 2018-08-17 DIAGNOSIS — H5203 Hypermetropia, bilateral: Secondary | ICD-10-CM | POA: Diagnosis not present

## 2018-08-29 DIAGNOSIS — J302 Other seasonal allergic rhinitis: Secondary | ICD-10-CM | POA: Diagnosis not present

## 2018-08-29 DIAGNOSIS — N183 Chronic kidney disease, stage 3 (moderate): Secondary | ICD-10-CM | POA: Diagnosis not present

## 2018-08-29 DIAGNOSIS — I872 Venous insufficiency (chronic) (peripheral): Secondary | ICD-10-CM | POA: Diagnosis not present

## 2018-08-29 DIAGNOSIS — R609 Edema, unspecified: Secondary | ICD-10-CM | POA: Diagnosis not present

## 2018-08-29 DIAGNOSIS — E1129 Type 2 diabetes mellitus with other diabetic kidney complication: Secondary | ICD-10-CM | POA: Diagnosis not present

## 2018-08-29 DIAGNOSIS — E7849 Other hyperlipidemia: Secondary | ICD-10-CM | POA: Diagnosis not present

## 2018-08-29 DIAGNOSIS — M1990 Unspecified osteoarthritis, unspecified site: Secondary | ICD-10-CM | POA: Diagnosis not present

## 2018-08-29 DIAGNOSIS — K219 Gastro-esophageal reflux disease without esophagitis: Secondary | ICD-10-CM | POA: Diagnosis not present

## 2018-08-29 DIAGNOSIS — R946 Abnormal results of thyroid function studies: Secondary | ICD-10-CM | POA: Diagnosis not present

## 2018-08-29 DIAGNOSIS — I10 Essential (primary) hypertension: Secondary | ICD-10-CM | POA: Diagnosis not present

## 2018-10-19 DIAGNOSIS — L570 Actinic keratosis: Secondary | ICD-10-CM | POA: Diagnosis not present

## 2018-10-19 DIAGNOSIS — X32XXXD Exposure to sunlight, subsequent encounter: Secondary | ICD-10-CM | POA: Diagnosis not present

## 2018-10-19 DIAGNOSIS — D0439 Carcinoma in situ of skin of other parts of face: Secondary | ICD-10-CM | POA: Diagnosis not present

## 2018-10-19 DIAGNOSIS — C4441 Basal cell carcinoma of skin of scalp and neck: Secondary | ICD-10-CM | POA: Diagnosis not present

## 2018-10-24 ENCOUNTER — Other Ambulatory Visit: Payer: Self-pay | Admitting: Internal Medicine

## 2018-10-24 DIAGNOSIS — Z1231 Encounter for screening mammogram for malignant neoplasm of breast: Secondary | ICD-10-CM

## 2018-12-13 DIAGNOSIS — L570 Actinic keratosis: Secondary | ICD-10-CM | POA: Diagnosis not present

## 2018-12-13 DIAGNOSIS — L820 Inflamed seborrheic keratosis: Secondary | ICD-10-CM | POA: Diagnosis not present

## 2018-12-13 DIAGNOSIS — Z08 Encounter for follow-up examination after completed treatment for malignant neoplasm: Secondary | ICD-10-CM | POA: Diagnosis not present

## 2018-12-13 DIAGNOSIS — Z85828 Personal history of other malignant neoplasm of skin: Secondary | ICD-10-CM | POA: Diagnosis not present

## 2018-12-13 DIAGNOSIS — C4441 Basal cell carcinoma of skin of scalp and neck: Secondary | ICD-10-CM | POA: Diagnosis not present

## 2018-12-13 DIAGNOSIS — X32XXXD Exposure to sunlight, subsequent encounter: Secondary | ICD-10-CM | POA: Diagnosis not present

## 2018-12-14 ENCOUNTER — Ambulatory Visit
Admission: RE | Admit: 2018-12-14 | Discharge: 2018-12-14 | Disposition: A | Discharge status: Home / Self Care | Payer: Medicare HMO | Source: Ambulatory Visit | Attending: Internal Medicine | Admitting: Internal Medicine

## 2018-12-14 ENCOUNTER — Other Ambulatory Visit: Payer: Self-pay

## 2018-12-14 DIAGNOSIS — Z1231 Encounter for screening mammogram for malignant neoplasm of breast: Secondary | ICD-10-CM | POA: Diagnosis not present

## 2019-02-13 DIAGNOSIS — N184 Chronic kidney disease, stage 4 (severe): Secondary | ICD-10-CM | POA: Diagnosis not present

## 2019-02-13 DIAGNOSIS — I129 Hypertensive chronic kidney disease with stage 1 through stage 4 chronic kidney disease, or unspecified chronic kidney disease: Secondary | ICD-10-CM | POA: Diagnosis not present

## 2019-02-13 DIAGNOSIS — E039 Hypothyroidism, unspecified: Secondary | ICD-10-CM | POA: Diagnosis not present

## 2019-02-13 DIAGNOSIS — E7849 Other hyperlipidemia: Secondary | ICD-10-CM | POA: Diagnosis not present

## 2019-02-13 DIAGNOSIS — E1129 Type 2 diabetes mellitus with other diabetic kidney complication: Secondary | ICD-10-CM | POA: Diagnosis not present

## 2019-02-13 DIAGNOSIS — R42 Dizziness and giddiness: Secondary | ICD-10-CM | POA: Diagnosis not present

## 2019-02-22 DIAGNOSIS — R82998 Other abnormal findings in urine: Secondary | ICD-10-CM | POA: Diagnosis not present

## 2019-02-22 DIAGNOSIS — K219 Gastro-esophageal reflux disease without esophagitis: Secondary | ICD-10-CM | POA: Diagnosis not present

## 2019-02-22 DIAGNOSIS — Z23 Encounter for immunization: Secondary | ICD-10-CM | POA: Diagnosis not present

## 2019-02-22 DIAGNOSIS — E1129 Type 2 diabetes mellitus with other diabetic kidney complication: Secondary | ICD-10-CM | POA: Diagnosis not present

## 2019-02-22 DIAGNOSIS — Z0000 Encounter for general adult medical examination without abnormal findings: Secondary | ICD-10-CM | POA: Diagnosis not present

## 2019-02-22 DIAGNOSIS — E785 Hyperlipidemia, unspecified: Secondary | ICD-10-CM | POA: Diagnosis not present

## 2019-02-22 DIAGNOSIS — E039 Hypothyroidism, unspecified: Secondary | ICD-10-CM | POA: Diagnosis not present

## 2019-02-22 DIAGNOSIS — N184 Chronic kidney disease, stage 4 (severe): Secondary | ICD-10-CM | POA: Diagnosis not present

## 2019-02-22 DIAGNOSIS — I872 Venous insufficiency (chronic) (peripheral): Secondary | ICD-10-CM | POA: Diagnosis not present

## 2019-02-22 DIAGNOSIS — R410 Disorientation, unspecified: Secondary | ICD-10-CM | POA: Diagnosis not present

## 2019-02-22 DIAGNOSIS — I129 Hypertensive chronic kidney disease with stage 1 through stage 4 chronic kidney disease, or unspecified chronic kidney disease: Secondary | ICD-10-CM | POA: Diagnosis not present

## 2019-02-22 DIAGNOSIS — M1990 Unspecified osteoarthritis, unspecified site: Secondary | ICD-10-CM | POA: Diagnosis not present

## 2019-02-24 ENCOUNTER — Emergency Department (HOSPITAL_BASED_OUTPATIENT_CLINIC_OR_DEPARTMENT_OTHER)
Admission: EM | Admit: 2019-02-24 | Discharge: 2019-02-24 | Disposition: A | Discharge status: Home / Self Care | Payer: Medicare HMO | Attending: Emergency Medicine | Admitting: Emergency Medicine

## 2019-02-24 ENCOUNTER — Other Ambulatory Visit (HOSPITAL_COMMUNITY): Payer: Self-pay | Admitting: Internal Medicine

## 2019-02-24 ENCOUNTER — Emergency Department (HOSPITAL_BASED_OUTPATIENT_CLINIC_OR_DEPARTMENT_OTHER): Payer: Medicare HMO

## 2019-02-24 ENCOUNTER — Other Ambulatory Visit: Payer: Self-pay

## 2019-02-24 ENCOUNTER — Encounter (HOSPITAL_BASED_OUTPATIENT_CLINIC_OR_DEPARTMENT_OTHER): Payer: Self-pay | Admitting: *Deleted

## 2019-02-24 ENCOUNTER — Other Ambulatory Visit: Payer: Self-pay | Admitting: Internal Medicine

## 2019-02-24 DIAGNOSIS — N189 Chronic kidney disease, unspecified: Secondary | ICD-10-CM | POA: Insufficient documentation

## 2019-02-24 DIAGNOSIS — I447 Left bundle-branch block, unspecified: Secondary | ICD-10-CM | POA: Diagnosis not present

## 2019-02-24 DIAGNOSIS — R410 Disorientation, unspecified: Secondary | ICD-10-CM

## 2019-02-24 DIAGNOSIS — Z7982 Long term (current) use of aspirin: Secondary | ICD-10-CM | POA: Insufficient documentation

## 2019-02-24 DIAGNOSIS — Z87891 Personal history of nicotine dependence: Secondary | ICD-10-CM | POA: Diagnosis not present

## 2019-02-24 DIAGNOSIS — Z20828 Contact with and (suspected) exposure to other viral communicable diseases: Secondary | ICD-10-CM | POA: Diagnosis not present

## 2019-02-24 DIAGNOSIS — E785 Hyperlipidemia, unspecified: Secondary | ICD-10-CM | POA: Insufficient documentation

## 2019-02-24 DIAGNOSIS — I129 Hypertensive chronic kidney disease with stage 1 through stage 4 chronic kidney disease, or unspecified chronic kidney disease: Secondary | ICD-10-CM | POA: Diagnosis not present

## 2019-02-24 DIAGNOSIS — Z79899 Other long term (current) drug therapy: Secondary | ICD-10-CM | POA: Insufficient documentation

## 2019-02-24 DIAGNOSIS — E119 Type 2 diabetes mellitus without complications: Secondary | ICD-10-CM | POA: Insufficient documentation

## 2019-02-24 DIAGNOSIS — R4182 Altered mental status, unspecified: Secondary | ICD-10-CM | POA: Diagnosis present

## 2019-02-24 DIAGNOSIS — Z03818 Encounter for observation for suspected exposure to other biological agents ruled out: Secondary | ICD-10-CM | POA: Diagnosis not present

## 2019-02-24 DIAGNOSIS — E871 Hypo-osmolality and hyponatremia: Secondary | ICD-10-CM | POA: Diagnosis not present

## 2019-02-24 DIAGNOSIS — J9811 Atelectasis: Secondary | ICD-10-CM | POA: Diagnosis not present

## 2019-02-24 LAB — URINALYSIS, ROUTINE W REFLEX MICROSCOPIC
Bilirubin Urine: NEGATIVE
Glucose, UA: NEGATIVE mg/dL
Ketones, ur: NEGATIVE mg/dL
Leukocytes,Ua: NEGATIVE
Nitrite: NEGATIVE
Protein, ur: 100 mg/dL — AB
Specific Gravity, Urine: 1.01 (ref 1.005–1.030)
pH: 7.5 (ref 5.0–8.0)

## 2019-02-24 LAB — COMPREHENSIVE METABOLIC PANEL
ALT: 19 U/L (ref 0–44)
AST: 34 U/L (ref 15–41)
Albumin: 3.7 g/dL (ref 3.5–5.0)
Alkaline Phosphatase: 45 U/L (ref 38–126)
Anion gap: 11 (ref 5–15)
BUN: 28 mg/dL — ABNORMAL HIGH (ref 8–23)
CO2: 22 mmol/L (ref 22–32)
Calcium: 10 mg/dL (ref 8.9–10.3)
Chloride: 93 mmol/L — ABNORMAL LOW (ref 98–111)
Creatinine, Ser: 2.78 mg/dL — ABNORMAL HIGH (ref 0.44–1.00)
GFR calc Af Amer: 17 mL/min — ABNORMAL LOW (ref >60)
GFR calc non Af Amer: 15 mL/min — ABNORMAL LOW (ref >60)
Glucose, Bld: 108 mg/dL — ABNORMAL HIGH (ref 70–99)
Potassium: 4.2 mmol/L (ref 3.5–5.1)
Sodium: 126 mmol/L — ABNORMAL LOW (ref 135–145)
Total Bilirubin: 0.5 mg/dL (ref 0.3–1.2)
Total Protein: 6.9 g/dL (ref 6.5–8.1)

## 2019-02-24 LAB — CBC WITH DIFFERENTIAL/PLATELET
Abs Immature Granulocytes: 0.03 10*3/uL (ref 0.00–0.07)
Basophils Absolute: 0 10*3/uL (ref 0.0–0.1)
Basophils Relative: 0 %
Eosinophils Absolute: 0.2 10*3/uL (ref 0.0–0.5)
Eosinophils Relative: 2 %
HCT: 33.4 % — ABNORMAL LOW (ref 36.0–46.0)
Hemoglobin: 11 g/dL — ABNORMAL LOW (ref 12.0–15.0)
Immature Granulocytes: 0 %
Lymphocytes Relative: 22 %
Lymphs Abs: 1.9 10*3/uL (ref 0.7–4.0)
MCH: 29.1 pg (ref 26.0–34.0)
MCHC: 32.9 g/dL (ref 30.0–36.0)
MCV: 88.4 fL (ref 80.0–100.0)
Monocytes Absolute: 0.9 10*3/uL (ref 0.1–1.0)
Monocytes Relative: 10 %
Neutro Abs: 5.4 10*3/uL (ref 1.7–7.7)
Neutrophils Relative %: 66 %
Platelets: 324 10*3/uL (ref 150–400)
RBC: 3.78 MIL/uL — ABNORMAL LOW (ref 3.87–5.11)
RDW: 12.3 % (ref 11.5–15.5)
WBC: 8.3 10*3/uL (ref 4.0–10.5)
nRBC: 0 % (ref 0.0–0.2)

## 2019-02-24 LAB — URINALYSIS, MICROSCOPIC (REFLEX)

## 2019-02-24 LAB — TROPONIN I (HIGH SENSITIVITY)
Troponin I (High Sensitivity): 47 ng/L — ABNORMAL HIGH (ref <18)
Troponin I (High Sensitivity): 43 ng/L — ABNORMAL HIGH (ref <18)

## 2019-02-24 LAB — SARS CORONAVIRUS 2 BY RT PCR (HOSPITAL ORDER, PERFORMED IN ~~LOC~~ HOSPITAL LAB): SARS Coronavirus 2: NEGATIVE

## 2019-02-24 MED ORDER — SODIUM CHLORIDE 0.9 % IV BOLUS
1000.0000 mL | Freq: Once | INTRAVENOUS | Status: AC
  Administered 2019-02-24: 1000 mL via INTRAVENOUS

## 2019-02-24 NOTE — ED Triage Notes (Signed)
Pt c/o confusion x 3 days , pt states increased urination

## 2019-02-24 NOTE — ED Provider Notes (Signed)
[facility] EMERGENCY DEPARTMENT Provider Note   CSN: [id number] Arrival date & time: 02/24/19  1413     History   Chief Complaint Chief Complaint  Patient presents with   Altered Mental Status    HPI Abigail Huang is a 85 y.o. female.     Abigail Huang is a 85 y.o. female with a history of diabetes, hypertension, hyperlipidemia, CKD, who presents to the emergency department for evaluation of episodes of confusion.  Patient's daughter reports that over the past month she has had a few episodes where she wakes up in the morning, or in the middle of the night, sits up on the side of the bed and is disoriented, patient reports that she has to ask her husband what to do, she reports he usually helps her to the bathroom but she needs help doing tasks that she would typically be able to do without difficulty.  Daughter provides example that she will go to the bathroom but then forget to pull her pants down and urinate on herself.  Symptoms seem to resolve on their own.  Patient denies any associated headaches, vision changes, nausea, vomiting, no numbness weakness or tingling.  No dizziness.  She denies any fevers or chills.  No cough, chest pain or shortness of breath.  No dysuria or urinary frequency aside from urinating frequently due to her Lasix which is not new or changed.  Daughter reports that they went to her PCP regarding the symptoms on Wednesday, he wondered whether she was having hypoglycemic episodes, but the patient's husband cannot seem to remember to check her blood sugar during these episodes while helping her.  PCP checked lab work and change some of her diabetes medications, discontinuing her Metformin due to elevated kidney function, he also requested that she increase her fluid intake.  He has scheduled the patient for an outpatient MRI in the next few days.  Daughter was concerned when she had these episodes of confusion 3 days in a row, and so today  decided to bring her into the emergency department for further evaluation.     Past Medical History:  Diagnosis Date   Diabetes mellitus    Hyperlipidemia    Hypertension     Patient Active Problem List   Diagnosis Date Noted   Essential hypertension 01/06/2017   Dyspnea on exertion 01/06/2017   Hyperlipidemia 01/06/2017   Closed displaced fracture of right femoral neck ([place]) 11/03/2016   Type 2 diabetes mellitus with stage 4 chronic kidney disease, with long-term current use of insulin ([place]) 11/03/2016   Hyponatremia 11/03/2016   Leukocytosis 11/03/2016   Normocytic anemia 11/03/2016   CKD (chronic kidney disease), stage IV ([place]) 11/03/2016    Past Surgical History:  Procedure Laterality Date   ABDOMINAL HYSTERECTOMY     ANTERIOR APPROACH HEMI HIP ARTHROPLASTY Right 11/04/2016   Procedure: ANTERIOR APPROACH HEMI HIP ARTHROPLASTY;  Surgeon: Dorna Leitz, MD;  Location: [facility];  Service: Orthopedics;  Laterality: Right;   COLONOSCOPY     POLYPECTOMY       OB History   No obstetric history on file.      Home Medications    Prior to Admission medications   Medication Sig Start Date End Date Taking? Authorizing Provider  amLODipine (NORVASC) 5 MG tablet Take 1 tablet (5 mg total) by mouth daily. 11/08/16   Hosie Poisson, MD  aspirin EC 325 MG tablet Take 1 tablet (325 mg total) by mouth 2 (two) times daily after  a meal. Take x 1 month post op to decrease risk of blood clots. 11/04/16   Gary Fleet, PA-C  atenolol (TENORMIN) 50 MG tablet Take 50 mg by mouth daily.     [provider]  Calcium Carbonate-Vitamin D (CALTRATE 600+D PO) Take 1 tablet by mouth daily.     [provider]  insulin detemir (LEVEMIR) 100 UNIT/ML injection Inject 10 Units into the skin daily.    [provider]  levothyroxine (SYNTHROID) 25 MCG tablet  02/13/19   [provider]  Multiple Vitamins-Minerals (CENTRUM SILVER PO) Take 1 tablet by mouth  daily.    [provider]  simvastatin (ZOCOR) 20 MG tablet Take 20 mg by mouth daily.    [provider]  triamterene-hydrochlorothiazide Bradd Burner) 37.5-25 MG tablet  02/07/19   [provider]    Family History Family History  Problem Relation Age of Onset   Heart disease Mother    Heart disease Father    Heart disease Sister     Social History Social History   Tobacco Use   Smoking status: Former Smoker    Quit date: 06/27/1967    Years since quitting: 51.6   Smokeless tobacco: Never Used  Substance Use Topics   Alcohol use: No   Drug use: No     Allergies   Patient has no known allergies.   Review of Systems Review of Systems  Constitutional: Negative for chills and fever.  HENT: Negative.   Eyes: Negative for visual disturbance.  Respiratory: Negative for cough and shortness of breath.   Cardiovascular: Negative for chest pain.  Gastrointestinal: Negative for abdominal pain, constipation, diarrhea, nausea and vomiting.  Genitourinary: Negative for dysuria and frequency.  Musculoskeletal: Negative for arthralgias and myalgias.  Skin: Negative for color change and rash.  Neurological: Negative for dizziness, seizures, syncope, facial asymmetry, speech difficulty, weakness, light-headedness, numbness and headaches.  Psychiatric/Behavioral: Positive for confusion.     Physical Exam Updated Vital Signs BP 108/74    Pulse 95    Temp 98.4 F (36.9 C)    Resp 18    Ht 5\' 6"  (1.676 m)    Wt 72.1 kg    SpO2 97%    BMI 25.66 kg/m   Physical Exam Vitals signs and nursing note reviewed.  Constitutional:      General: She is not in acute distress.    Appearance: Normal appearance. She is well-developed and normal weight. She is not ill-appearing or diaphoretic.     Comments: Alert and oriented, well-appearing and in no acute distress  HENT:     Head: Normocephalic and atraumatic.     Mouth/Throat:     Mouth: Mucous membranes are  moist.     Pharynx: Oropharynx is clear.  Eyes:     General:        Right eye: No discharge.        Left eye: No discharge.     Extraocular Movements: Extraocular movements intact.     Conjunctiva/sclera: Conjunctivae normal.     Pupils: Pupils are equal, round, and reactive to light.  Neck:     Musculoskeletal: Neck supple.  Cardiovascular:     Rate and Rhythm: Normal rate and regular rhythm.     Pulses: Normal pulses.          Radial pulses are 2+ on the right side and 2+ on the left side.       Dorsalis pedis pulses are 2+ on the right side and  2+ on the left side.     Heart sounds: Normal heart sounds. No murmur. No friction rub. No gallop.   Pulmonary:     Effort: Pulmonary effort is normal. No respiratory distress.     Breath sounds: Normal breath sounds. No wheezing or rales.     Comments: Respirations equal and unlabored, patient able to speak in full sentences, lungs clear to auscultation bilaterally Abdominal:     General: Bowel sounds are normal. There is no distension.     Palpations: Abdomen is soft. There is no mass.     Tenderness: There is no abdominal tenderness. There is no guarding.     Comments: Abdomen soft, nondistended, nontender to palpation in all quadrants without guarding or peritoneal signs  Musculoskeletal:        General: No deformity.     Right lower leg: No edema.     Left lower leg: No edema.     Comments: Bilateral lower extremities warm and well perfused without edema, 2+ DP and PT pulses  Skin:    General: Skin is warm and dry.     Capillary Refill: Capillary refill takes less than 2 seconds.  Neurological:     Mental Status: She is alert.     Coordination: Coordination normal.     Comments: Speech is clear, able to follow commands CN III-XII intact Normal strength in upper and lower extremities bilaterally including dorsiflexion and plantar flexion, strong and equal grip strength Sensation normal to light and sharp touch Moves extremities  without ataxia, coordination intact Normal finger to nose and rapid alternating movements No pronator drift  Psychiatric:        Mood and Affect: Mood normal.        Behavior: Behavior normal.      ED Treatments / Results  Labs (all labs ordered are listed, but only abnormal results are displayed) Labs Reviewed  CBC WITH DIFFERENTIAL/PLATELET - Abnormal; Notable for the following components:      Result Value   RBC 3.78 (*)    Hemoglobin 11.0 (*)    HCT 33.4 (*)    All other components within normal limits  COMPREHENSIVE METABOLIC PANEL - Abnormal; Notable for the following components:   Sodium 126 (*)    Chloride 93 (*)    Glucose, Bld 108 (*)    BUN 28 (*)    Creatinine, Ser 2.78 (*)    GFR calc non Af Amer 15 (*)    GFR calc Af Amer 17 (*)    All other components within normal limits  URINALYSIS, ROUTINE W REFLEX MICROSCOPIC - Abnormal; Notable for the following components:   Color, Urine STRAW (*)    Hgb urine dipstick MODERATE (*)    Protein, ur 100 (*)    All other components within normal limits  URINALYSIS, MICROSCOPIC (REFLEX) - Abnormal; Notable for the following components:   Bacteria, UA RARE (*)    All other components within normal limits  TROPONIN I (HIGH SENSITIVITY) - Abnormal; Notable for the following components:   Troponin I (High Sensitivity) 47 (*)    All other components within normal limits  TROPONIN I (HIGH SENSITIVITY) - Abnormal; Notable for the following components:   Troponin I (High Sensitivity) 43 (*)    All other components within normal limits  SARS CORONAVIRUS 2 (HOSPITAL ORDER, [facility])  URINE CULTURE    EKG EKG Interpretation  Date/Time:  Friday February 24 2019 14:21:17 EDT Ventricular Rate:  98 PR Interval:    QRS Duration: 143 QT Interval:  383 QTC Calculation: 489 R Axis:   -23 Text Interpretation:  Sinus rhythm Left bundle branch block new lbbb since previous ekg Confirmed by Isla Pence [phone]) on 02/24/2019 2:24:52 PM   Radiology Dg Chest 2 View  Result Date: 02/24/2019 CLINICAL DATA:  85 year old female with altered mental status EXAM: CHEST - 2 VIEW COMPARISON:  Head chest radiograph dated 11/03/2016 FINDINGS: Diffuse interstitial coarsening and chronic bronchitic changes. Faint areas of density primarily involving the lower lung fields may represent atelectasis or infiltrate. Clinical correlation is recommended. No lobar consolidation, pleural effusion, or pneumothorax. The cardiac silhouette is within normal limits. Atherosclerotic calcification of the aorta. Degenerative changes of the spine. No acute osseous pathology. IMPRESSION: Bibasilar atelectasis versus developing infiltrate. Electronically Signed   By: Anner Crete M.D.   On: 02/24/2019 15:42   Ct Head Wo Contrast  Result Date: 02/24/2019 CLINICAL DATA:  Confusion, increasing for several days EXAM: CT HEAD WITHOUT CONTRAST TECHNIQUE: Contiguous axial images were obtained from the base of the skull through the vertex without intravenous contrast. COMPARISON:  September 25, 2017 FINDINGS: Brain: Mild diffuse atrophy is stable. There is no intracranial mass, hemorrhage, extra-axial fluid collection, or midline shift. There is patchy small vessel disease in the centra semiovale bilaterally, stable. No acute infarct is demonstrable on this study. Vascular: There is no hyperdense vessel. There is calcification in each carotid siphon region. Skull: The bony calvarium appears intact. Sinuses/Orbits: There is mucosal thickening in several ethmoid air cells. Other visualized paranasal sinuses are clear. Orbits appear symmetric bilaterally. Other: There is extensive opacification in the left mastoid air cell region, stable. Mastoids on the right are clear. There is debris in the external auditory canals, more on the right than on the left. IMPRESSION: Stable atrophy with periventricular small vessel disease. No acute infarct  evident. No mass or hemorrhage. There are foci of arterial vascular calcification. There is extensive chronic left-sided mastoid air cell disease. There is mucosal thickening in several ethmoid air cells. There is probable cerumen in the external auditory canals, more on the right than the left. Electronically Signed   By: Lowella Grip III M.D.   On: 02/24/2019 15:44    Procedures Procedures (including critical care time)  Medications Ordered in ED Medications  sodium chloride 0.9 % bolus 1,000 mL (0 mLs Intravenous Stopped 02/24/19 1732)     Initial Impression / Assessment and Plan / ED Course  I have reviewed the triage vital signs and the nursing notes.  Pertinent labs & imaging results that were available during my care of the patient were reviewed by me and considered in my medical decision making (see chart for details).  85 year old female presents with intermittent episodes of confusion that seem to happen in the middle of the night or early morning but then improves throughout the day.  Had 3 consecutive episodes over the past 3 days which concerned daughter.  On arrival she is afebrile with normal vitals, alert and oriented and in no acute distress.  Currently has no focal neurologic deficits and does not really have any other associated symptoms.  No headaches or vision changes, no numbness or weakness.  No chest pain or shortness of breath, no abdominal pain, no fevers or infectious symptoms.  Was seen by PCP on Wednesday, slight worsening in her kidney and function, was 2.8 at doctor's appointment, on lab work 2 years ago was 1.98, unsure of more recent  previous values from PCP.  Was taken off of metformin with increased insulin, and asked to increase her p.o. water intake.  Will check basic labs, urinalysis and culture, troponin, and chest x-ray as well as head CT.  On initial EKG done patient had signs of a left bundle branch block but on repeat EKG left bundle branch was  resolved question if this was injured on the wrong patient, troponin initially 47, patient without any active chest pain or shortness of breath, could be related to CKD.  Will check delta troponin.  Labs show no leukocytosis, stable hemoglobin.  Sodium of 126, labs show history of chronic hyponatremia, creatinine 2.78 today, was 2.82 days ago, BUN slightly elevated, normal glucose.  Will give 1 L IV fluids for dehydration as I think this is likely contributing to worsened kidney function.  Urinalysis without signs of infection.  Shows bibasilar atelectasis versus developing infiltrate I favor this being more likely atelectasis as patient has no leukocytosis, no cough or shortness of breath has been afebrile here without tachypnea or hypoxia.  COVID swab sent, returned negative.  CT head shows stable atrophy with periventricular small vessel disease, no acute infarct evident, no mass or hemorrhage.  Discussed these results with patient and daughter, feel a lot of these findings may be chronic, and that intermittent confusion could be the beginnings of dementia, but could also be transient hypoglycemia, will discuss with hospitalist service.  Case discussed with Dr. Karmen Bongo with hospitalist, reviewed lab work imaging and patient presentation, feels this is most likely chronic worsening of patient's CKD and potentially early dementia agrees that may be some transient hypoglycemia, I have discussed with patient's daughter who plans to stay overnight with the patient so that she can hopefully check glucose if she has another similar episode.  Dr. Lorin Mercy feels that it is reasonable to have patient follow-up with PCP and continue with outpatient MRI, feels that admitting to the hospital would potentially put patient at risk for exposure to COVID and given that most of her findings today seem chronic feels that risk outweighs benefit.  I think this is very reasonable and I discussed this plan with the daughter.   Will collect delta troponin as long as this does not have a significant increase will plan to discharge home with continued outpatient follow-up.  Delta troponin improved at 43.  This is very reassuring.  No EKG changes and vitals have remained stable.  Will discharge patient home, I have asked her to continue to increase her water intake and follow-up closely with her PCP.  They have outpatient MRI scheduled.  Strict return precautions discussed.  Discharged home with daughter in good condition.  Patient discussed with Dr. Gilford Raid, who saw patient as well and agrees with plan.   Final Clinical Impressions(s) / ED Diagnoses   Final diagnoses:  Transient confusion  Chronic kidney disease, unspecified CKD stage  Hyponatremia    ED Discharge Orders    None       Janet Berlin 02/24/19 2315    Isla Pence, MD 02/26/19 0730

## 2019-02-24 NOTE — Progress Notes (Signed)
Called for transfer from [facility] to [facility] for this patient with h/o HTN, HLD, and DM.   85yo female with weeks of confusion in the mornings upon awakening, improves through the morning.  Saw PCP Wednesday with med adjustment.  Has had repeated days of this and so daughter brought her in.  No other real symptoms.  Creatinine 2.78, 2.80 on Wednesday; 38/1.98 in 2018.  CXR unremarkable.  COVID pending.  No UA.  EKG with ?new LBBB, not still there.  HS troponin 47, no prior.  ? AM hypoglycemia.  She has been ordered for outpatient MRI, no focal neurologic deficits.  Given some IVF, consider d/c to home and further PCP evaluation.  Recommend recheck delta troponin and if stable then can d/c to home with further outpatient evaluation for likely progressive CKD and possible developing dementia with MRI pending early next week.   Carlyon Shadow, M.D.

## 2019-02-24 NOTE — ED Notes (Signed)
Pt aware of need to provide urine specimen when able.

## 2019-02-24 NOTE — Discharge Instructions (Signed)
Your work-up today has overall been reassuring the CT scan of your head does not show any acute abnormalities.  Your kidney function is no worse than it was on Wednesday at the primary care office, you were given IV fluids for this, continue to increase fluid intake at home.  Please follow-up as planned for MRI in the next few days and then follow-up with your primary doctor.  I do think it would be extremely helpful to check blood sugar during 1 of these episodes of confusion as she could be having intermittent episodes of hypoglycemia especially with her kidney function a bit worse than usual.  Return to the ED for any headaches, numbness, weakness, fevers, chest pain, shortness of breath, abdominal pain, vomiting or any other new or concerning symptoms

## 2019-02-24 NOTE — ED Notes (Signed)
Ambulated to bathroom with steady gait

## 2019-02-26 ENCOUNTER — Ambulatory Visit (HOSPITAL_COMMUNITY)
Admission: RE | Admit: 2019-02-26 | Discharge: 2019-02-26 | Disposition: A | Discharge status: Home / Self Care | Payer: Medicare HMO | Source: Ambulatory Visit | Attending: Internal Medicine | Admitting: Internal Medicine

## 2019-02-26 ENCOUNTER — Other Ambulatory Visit: Payer: Self-pay

## 2019-02-26 ENCOUNTER — Emergency Department (HOSPITAL_COMMUNITY)
Admission: EM | Admit: 2019-02-26 | Discharge: 2019-02-26 | Disposition: A | Discharge status: Other Acute Inpatient Hospital | Payer: Medicare HMO

## 2019-02-26 DIAGNOSIS — R410 Disorientation, unspecified: Secondary | ICD-10-CM

## 2019-02-26 DIAGNOSIS — R4182 Altered mental status, unspecified: Secondary | ICD-10-CM | POA: Diagnosis not present

## 2019-02-26 LAB — URINE CULTURE: Culture: <10000 — AB

## 2019-02-27 DIAGNOSIS — E1129 Type 2 diabetes mellitus with other diabetic kidney complication: Secondary | ICD-10-CM | POA: Diagnosis not present

## 2019-02-27 DIAGNOSIS — E871 Hypo-osmolality and hyponatremia: Secondary | ICD-10-CM | POA: Diagnosis not present

## 2019-02-27 DIAGNOSIS — N184 Chronic kidney disease, stage 4 (severe): Secondary | ICD-10-CM | POA: Diagnosis not present

## 2019-02-27 DIAGNOSIS — G454 Transient global amnesia: Secondary | ICD-10-CM | POA: Diagnosis not present

## 2019-02-27 DIAGNOSIS — I129 Hypertensive chronic kidney disease with stage 1 through stage 4 chronic kidney disease, or unspecified chronic kidney disease: Secondary | ICD-10-CM | POA: Diagnosis not present

## 2019-03-01 DIAGNOSIS — Z1212 Encounter for screening for malignant neoplasm of rectum: Secondary | ICD-10-CM | POA: Diagnosis not present

## 2019-03-02 DIAGNOSIS — I10 Essential (primary) hypertension: Secondary | ICD-10-CM | POA: Diagnosis not present

## 2019-03-02 DIAGNOSIS — I129 Hypertensive chronic kidney disease with stage 1 through stage 4 chronic kidney disease, or unspecified chronic kidney disease: Secondary | ICD-10-CM | POA: Diagnosis not present

## 2019-03-02 DIAGNOSIS — N184 Chronic kidney disease, stage 4 (severe): Secondary | ICD-10-CM | POA: Diagnosis not present

## 2019-03-02 DIAGNOSIS — E1129 Type 2 diabetes mellitus with other diabetic kidney complication: Secondary | ICD-10-CM | POA: Diagnosis not present

## 2019-03-12 ENCOUNTER — Other Ambulatory Visit: Payer: Self-pay

## 2019-03-12 ENCOUNTER — Emergency Department (HOSPITAL_BASED_OUTPATIENT_CLINIC_OR_DEPARTMENT_OTHER): Payer: Medicare HMO

## 2019-03-12 ENCOUNTER — Encounter (HOSPITAL_BASED_OUTPATIENT_CLINIC_OR_DEPARTMENT_OTHER): Payer: Self-pay | Admitting: *Deleted

## 2019-03-12 ENCOUNTER — Emergency Department (HOSPITAL_BASED_OUTPATIENT_CLINIC_OR_DEPARTMENT_OTHER)
Admission: EM | Admit: 2019-03-12 | Discharge: 2019-03-12 | Disposition: A | Discharge status: Home / Self Care | Payer: Medicare HMO | Attending: Emergency Medicine | Admitting: Emergency Medicine

## 2019-03-12 DIAGNOSIS — E1122 Type 2 diabetes mellitus with diabetic chronic kidney disease: Secondary | ICD-10-CM | POA: Diagnosis not present

## 2019-03-12 DIAGNOSIS — R0789 Other chest pain: Secondary | ICD-10-CM | POA: Insufficient documentation

## 2019-03-12 DIAGNOSIS — R0600 Dyspnea, unspecified: Secondary | ICD-10-CM | POA: Diagnosis present

## 2019-03-12 DIAGNOSIS — Z96641 Presence of right artificial hip joint: Secondary | ICD-10-CM | POA: Insufficient documentation

## 2019-03-12 DIAGNOSIS — J811 Chronic pulmonary edema: Secondary | ICD-10-CM | POA: Diagnosis not present

## 2019-03-12 DIAGNOSIS — J810 Acute pulmonary edema: Secondary | ICD-10-CM | POA: Diagnosis not present

## 2019-03-12 DIAGNOSIS — R0602 Shortness of breath: Secondary | ICD-10-CM | POA: Insufficient documentation

## 2019-03-12 DIAGNOSIS — I129 Hypertensive chronic kidney disease with stage 1 through stage 4 chronic kidney disease, or unspecified chronic kidney disease: Secondary | ICD-10-CM | POA: Diagnosis not present

## 2019-03-12 DIAGNOSIS — Z87891 Personal history of nicotine dependence: Secondary | ICD-10-CM | POA: Diagnosis not present

## 2019-03-12 DIAGNOSIS — N184 Chronic kidney disease, stage 4 (severe): Secondary | ICD-10-CM | POA: Diagnosis not present

## 2019-03-12 DIAGNOSIS — I447 Left bundle-branch block, unspecified: Secondary | ICD-10-CM | POA: Diagnosis not present

## 2019-03-12 LAB — CBC WITH DIFFERENTIAL/PLATELET
Abs Immature Granulocytes: 0.04 10*3/uL (ref 0.00–0.07)
Basophils Absolute: 0 10*3/uL (ref 0.0–0.1)
Basophils Relative: 0 %
Eosinophils Absolute: 0.3 10*3/uL (ref 0.0–0.5)
Eosinophils Relative: 2 %
HCT: 33.2 % — ABNORMAL LOW (ref 36.0–46.0)
Hemoglobin: 10.7 g/dL — ABNORMAL LOW (ref 12.0–15.0)
Immature Granulocytes: 0 %
Lymphocytes Relative: 8 %
Lymphs Abs: 1 10*3/uL (ref 0.7–4.0)
MCH: 29.4 pg (ref 26.0–34.0)
MCHC: 32.2 g/dL (ref 30.0–36.0)
MCV: 91.2 fL (ref 80.0–100.0)
Monocytes Absolute: 0.9 10*3/uL (ref 0.1–1.0)
Monocytes Relative: 7 %
Neutro Abs: 10.3 10*3/uL — ABNORMAL HIGH (ref 1.7–7.7)
Neutrophils Relative %: 83 %
Platelets: 323 10*3/uL (ref 150–400)
RBC: 3.64 MIL/uL — ABNORMAL LOW (ref 3.87–5.11)
RDW: 12.5 % (ref 11.5–15.5)
WBC: 12.5 10*3/uL — ABNORMAL HIGH (ref 4.0–10.5)
nRBC: 0 % (ref 0.0–0.2)

## 2019-03-12 LAB — COMPREHENSIVE METABOLIC PANEL
ALT: 15 U/L (ref 0–44)
AST: 19 U/L (ref 15–41)
Albumin: 3.4 g/dL — ABNORMAL LOW (ref 3.5–5.0)
Alkaline Phosphatase: 56 U/L (ref 38–126)
Anion gap: 12 (ref 5–15)
BUN: 28 mg/dL — ABNORMAL HIGH (ref 8–23)
CO2: 20 mmol/L — ABNORMAL LOW (ref 22–32)
Calcium: 9.8 mg/dL (ref 8.9–10.3)
Chloride: 97 mmol/L — ABNORMAL LOW (ref 98–111)
Creatinine, Ser: 2.84 mg/dL — ABNORMAL HIGH (ref 0.44–1.00)
GFR calc Af Amer: 17 mL/min — ABNORMAL LOW (ref >60)
GFR calc non Af Amer: 15 mL/min — ABNORMAL LOW (ref >60)
Glucose, Bld: 190 mg/dL — ABNORMAL HIGH (ref 70–99)
Potassium: 4.2 mmol/L (ref 3.5–5.1)
Sodium: 129 mmol/L — ABNORMAL LOW (ref 135–145)
Total Bilirubin: 0.6 mg/dL (ref 0.3–1.2)
Total Protein: 7.1 g/dL (ref 6.5–8.1)

## 2019-03-12 LAB — URINALYSIS, MICROSCOPIC (REFLEX): Bacteria, UA: NONE SEEN

## 2019-03-12 LAB — URINALYSIS, ROUTINE W REFLEX MICROSCOPIC
Bilirubin Urine: NEGATIVE
Glucose, UA: 100 mg/dL — AB
Ketones, ur: NEGATIVE mg/dL
Leukocytes,Ua: NEGATIVE
Nitrite: NEGATIVE
Protein, ur: >300 mg/dL — AB
Specific Gravity, Urine: 1.015 (ref 1.005–1.030)
pH: 7 (ref 5.0–8.0)

## 2019-03-12 LAB — BRAIN NATRIURETIC PEPTIDE: B Natriuretic Peptide: 488 pg/mL — ABNORMAL HIGH (ref 0.0–100.0)

## 2019-03-12 LAB — TROPONIN I (HIGH SENSITIVITY)
Troponin I (High Sensitivity): 10 ng/L (ref <18)
Troponin I (High Sensitivity): 11 ng/L (ref <18)

## 2019-03-12 MED ORDER — FUROSEMIDE 40 MG PO TABS
40.0000 mg | ORAL_TABLET | Freq: Once | ORAL | Status: AC
  Administered 2019-03-12: 40 mg via ORAL
  Filled 2019-03-12: qty 1

## 2019-03-12 MED ORDER — FUROSEMIDE 20 MG PO TABS: 20.0000 mg | ORAL_TABLET | Freq: Every day | ORAL | 0 refills | Status: DC

## 2019-03-12 NOTE — ED Notes (Signed)
Pt walked back from restroom next door to her room. Dyspnea on exertion, pt states this is a change from her usual activity tolerance.

## 2019-03-12 NOTE — ED Triage Notes (Signed)
Pt c/o of sob that started 2 weeks ago. C/o of "fullness" in her chest. States sob was worse tonight. Denies hx of these symptoms. resp even but slightly labored on arrival.

## 2019-03-12 NOTE — ED Notes (Signed)
Pt given a bedside commode and instructed to call for help when getting up.

## 2019-03-12 NOTE — ED Provider Notes (Signed)
Emergency Department Provider Note   I have reviewed the triage vital signs and the nursing notes.   HISTORY  Chief Complaint Shortness of Breath   HPI Abigail Huang is a 85 y.o. female with medical problems as documented below who presents emerged from today with 2 weeks of progressively worsening dyspnea on exertion and chest tightness on exertion.  Tonight it got worse with every time she laid down she gives short of breath and had to sit up to help it.  She states that she was on Maxide until a couple weeks ago when her doctor took her off of it.  Is also around this time that she is seen the emergency department and found to have a little bit of acute kidney injury and given some fluids and told to go home and drink more water.  She states has been drinking 4-5 bottles of water at home as instructed.  She is not on any diuretics at this time.  She has no history of heart failure.  No fever or productive cough or any cough is different than her normal.  No trauma.  No other associated or modifying symptoms.    Past Medical History:  Diagnosis Date  . Diabetes mellitus   . Hyperlipidemia   . Hypertension     Patient Active Problem List   Diagnosis Date Noted  . Essential hypertension 01/06/2017  . Dyspnea on exertion 01/06/2017  . Hyperlipidemia 01/06/2017  . Closed displaced fracture of right femoral neck ([place]) 11/03/2016  . Type 2 diabetes mellitus with stage 4 chronic kidney disease, with long-term current use of insulin ([place]) 11/03/2016  . Hyponatremia 11/03/2016  . Leukocytosis 11/03/2016  . Normocytic anemia 11/03/2016  . CKD (chronic kidney disease), stage IV ([place]) 11/03/2016    Past Surgical History:  Procedure Laterality Date  . ABDOMINAL HYSTERECTOMY    . ANTERIOR APPROACH HEMI HIP ARTHROPLASTY Right 11/04/2016   Procedure: ANTERIOR APPROACH HEMI HIP ARTHROPLASTY;  Surgeon: Dorna Leitz, MD;  Location: [place];  Service: Orthopedics;  Laterality: Right;   . COLONOSCOPY    . POLYPECTOMY      Current Outpatient Rx  . Order #: [id number] Class: Historical Med  . Order #: [id number] Class: Historical Med  . Order #: [id number] Class: Print  . Order #: [id number] Class: Historical Med  . Order #: [id number] Class: Historical Med  . [START ON 03/13/2019] Order #: [id number] Class: Normal  . Order #: [id number] Class: Historical Med  . Order #: [id number] Class: Historical Med  . Order #: [id number] Class: Historical Med  . Order #: [id number] Class: Historical Med    Allergies Patient has no known allergies.  Family History  Problem Relation Age of Onset  . Heart disease Mother   . Heart disease Father   . Heart disease Sister     Social History Social History   Tobacco Use  . Smoking status: Former Smoker    Quit date: 06/27/1967    Years since quitting: 51.7  . Smokeless tobacco: Never Used  Substance Use Topics  . Alcohol use: No  . Drug use: No    Review of Systems  All other systems negative except as documented in the HPI. All pertinent positives and negatives as reviewed in the HPI. ____________________________________________   PHYSICAL EXAM:  VITAL SIGNS: ED Triage Vitals  Enc Vitals Group     BP 03/12/19 0232 (!) 154/80     Pulse Rate 03/12/19 0232 85     Resp 03/12/19 0232 (!) 24     Temp  03/12/19 0232 98.8 F (37.1 C)     Temp Source 03/12/19 0232 Oral     SpO2 03/12/19 0232 95 %     Weight 03/12/19 0232 159 lb (72.1 kg)     Height 03/12/19 0232 5\' 6"  (1.676 m)    Constitutional: Alert and oriented. Well appearing and in no acute distress. Eyes: Conjunctivae are normal. PERRL. EOMI. Head: Atraumatic. Nose: No congestion/rhinnorhea. Mouth/Throat: Mucous membranes are moist.  Oropharynx non-erythematous. Neck: No stridor.  No meningeal signs.   Cardiovascular: Normal rate, regular rhythm. Good peripheral circulation. Grossly normal heart sounds.   Respiratory: Normal respiratory effort.  No retractions. Lungs with  bilateral crackles. Gastrointestinal: Soft and nontender. No distention.  Musculoskeletal: No lower extremity tenderness but has trace pitting edema to knees bilaterally. No gross deformities of extremities. Neurologic:  Normal speech and language. No gross focal neurologic deficits are appreciated.  Skin:  Skin is warm, dry and intact. No rash noted.   ____________________________________________   LABS (all labs ordered are listed, but only abnormal results are displayed)  Labs Reviewed  CBC WITH DIFFERENTIAL/PLATELET - Abnormal; Notable for the following components:      Result Value   WBC 12.5 (*)    RBC 3.64 (*)    Hemoglobin 10.7 (*)    HCT 33.2 (*)    Neutro Abs 10.3 (*)    All other components within normal limits  COMPREHENSIVE METABOLIC PANEL - Abnormal; Notable for the following components:   Sodium 129 (*)    Chloride 97 (*)    CO2 20 (*)    Glucose, Bld 190 (*)    BUN 28 (*)    Creatinine, Ser 2.84 (*)    Albumin 3.4 (*)    GFR calc non Af Amer 15 (*)    GFR calc Af Amer 17 (*)    All other components within normal limits  BRAIN NATRIURETIC PEPTIDE - Abnormal; Notable for the following components:   B Natriuretic Peptide 488.0 (*)    All other components within normal limits  URINALYSIS, ROUTINE W REFLEX MICROSCOPIC - Abnormal; Notable for the following components:   Glucose, UA 100 (*)    Hgb urine dipstick SMALL (*)    Protein, ur >300 (*)    All other components within normal limits  URINALYSIS, MICROSCOPIC (REFLEX)  TROPONIN I (HIGH SENSITIVITY)  TROPONIN I (HIGH SENSITIVITY)   ____________________________________________  EKG   EKG Interpretation  Date/Time:  Sunday March 12 2019 02:34:15 EDT Ventricular Rate:  77 PR Interval:    QRS Duration: 142 QT Interval:  418 QTC Calculation: 474 R Axis:   -17 Text Interpretation:  Sinus rhythm Left bundle branch block No significant change since last tracing Confirmed by Merrily Pew [id number]) on  03/12/2019 2:37:33 AM       ____________________________________________  RADIOLOGY  Dg Chest 2 View  Result Date: 03/12/2019 CLINICAL DATA:  Lower extremity swelling.  Evaluate for edema. EXAM: CHEST - 2 VIEW COMPARISON:  Radiograph 02/24/2019 FINDINGS: Normal heart size and mediastinal contours. Diffuse bilateral interstitial and peribronchial thickening, perihilar predominant. There are Kerley B-lines. No significant pleural effusion, there is minimal fluid in the fissures. No confluent airspace disease. No pneumothorax. IMPRESSION: Moderate pulmonary edema. Electronically Signed   By: Keith Rake M.D.   On: 03/12/2019 04:00    ____________________________________________   PROCEDURES  Procedure(s) performed:   Procedures  No critical care ____________________________________________   INITIAL IMPRESSION / ASSESSMENT AND PLAN / ED COURSE  Likely chf exacerbation from med  change and increased intake. Will verify, diurese and reassess for disposition.   Patient was diuresed in the emergency room and subsequently had improvement in her breathing.  She is not hypoxic, tachypneic or any other evidence of respiratory distress.  Patient states that she have close follow-up with her PCP (whom I messaged) and can get an appointment within the next week for reevaluation.  Knows to come back here if she gets worse significantly decreased urine output.     Pertinent labs & imaging results that were available during my care of the patient were reviewed by me and considered in my medical decision making (see chart for details).  A medical screening exam was performed and I feel the patient has had an appropriate workup for their chief complaint at this time and likelihood of emergent condition existing is low. They have been counseled on decision, discharge, follow up and which symptoms necessitate immediate return to the emergency department. They or their family verbally stated  understanding and agreement with plan and discharged in stable condition.   ____________________________________________  FINAL CLINICAL IMPRESSION(S) / ED DIAGNOSES  Final diagnoses:  Acute pulmonary edema ([place])     MEDICATIONS GIVEN DURING THIS VISIT:  Medications  furosemide (LASIX) tablet 40 mg (40 mg Oral Given 03/12/19 0416)     NEW OUTPATIENT MEDICATIONS STARTED DURING THIS VISIT:  Discharge Medication List as of 03/12/2019  6:33 AM    START taking these medications   Details  furosemide (LASIX) 20 MG tablet Take 1 tablet (20 mg total) by mouth daily., Starting Mon 03/13/2019, Normal        Note:  This note was prepared with assistance of Dragon voice recognition software. Occasional wrong-word or sound-a-like substitutions may have occurred due to the inherent limitations of voice recognition software.   Merrily Pew, MD 03/12/19 2259

## 2019-03-12 NOTE — ED Notes (Signed)
Pt transported to XR.  

## 2019-03-13 DIAGNOSIS — N184 Chronic kidney disease, stage 4 (severe): Secondary | ICD-10-CM | POA: Diagnosis not present

## 2019-03-13 DIAGNOSIS — E1129 Type 2 diabetes mellitus with other diabetic kidney complication: Secondary | ICD-10-CM | POA: Diagnosis not present

## 2019-03-13 DIAGNOSIS — E038 Other specified hypothyroidism: Secondary | ICD-10-CM | POA: Diagnosis not present

## 2019-03-13 DIAGNOSIS — I10 Essential (primary) hypertension: Secondary | ICD-10-CM | POA: Diagnosis not present

## 2019-03-17 ENCOUNTER — Inpatient Hospital Stay (HOSPITAL_COMMUNITY)
Admission: EM | Admit: 2019-03-17 | Discharge: 2019-03-20 | DRG: 291 | Disposition: A | Discharge status: Home Health | Payer: Medicare HMO | Attending: Internal Medicine | Admitting: Internal Medicine

## 2019-03-17 ENCOUNTER — Emergency Department (HOSPITAL_COMMUNITY): Payer: Medicare HMO

## 2019-03-17 ENCOUNTER — Other Ambulatory Visit: Payer: Self-pay

## 2019-03-17 ENCOUNTER — Inpatient Hospital Stay (HOSPITAL_COMMUNITY): Payer: Medicare HMO

## 2019-03-17 DIAGNOSIS — E119 Type 2 diabetes mellitus without complications: Secondary | ICD-10-CM

## 2019-03-17 DIAGNOSIS — Z79899 Other long term (current) drug therapy: Secondary | ICD-10-CM | POA: Diagnosis not present

## 2019-03-17 DIAGNOSIS — I132 Hypertensive heart and chronic kidney disease with heart failure and with stage 5 chronic kidney disease, or end stage renal disease: Principal | ICD-10-CM | POA: Diagnosis present

## 2019-03-17 DIAGNOSIS — N189 Chronic kidney disease, unspecified: Secondary | ICD-10-CM | POA: Insufficient documentation

## 2019-03-17 DIAGNOSIS — R0600 Dyspnea, unspecified: Secondary | ICD-10-CM | POA: Diagnosis not present

## 2019-03-17 DIAGNOSIS — R079 Chest pain, unspecified: Secondary | ICD-10-CM | POA: Diagnosis not present

## 2019-03-17 DIAGNOSIS — N261 Atrophy of kidney (terminal): Secondary | ICD-10-CM | POA: Diagnosis present

## 2019-03-17 DIAGNOSIS — E7849 Other hyperlipidemia: Secondary | ICD-10-CM | POA: Diagnosis not present

## 2019-03-17 DIAGNOSIS — E785 Hyperlipidemia, unspecified: Secondary | ICD-10-CM | POA: Diagnosis not present

## 2019-03-17 DIAGNOSIS — Z79890 Hormone replacement therapy (postmenopausal): Secondary | ICD-10-CM

## 2019-03-17 DIAGNOSIS — N185 Chronic kidney disease, stage 5: Secondary | ICD-10-CM | POA: Diagnosis not present

## 2019-03-17 DIAGNOSIS — N184 Chronic kidney disease, stage 4 (severe): Secondary | ICD-10-CM

## 2019-03-17 DIAGNOSIS — I878 Other specified disorders of veins: Secondary | ICD-10-CM | POA: Diagnosis present

## 2019-03-17 DIAGNOSIS — R0789 Other chest pain: Secondary | ICD-10-CM | POA: Diagnosis present

## 2019-03-17 DIAGNOSIS — I10 Essential (primary) hypertension: Secondary | ICD-10-CM | POA: Diagnosis not present

## 2019-03-17 DIAGNOSIS — N179 Acute kidney failure, unspecified: Secondary | ICD-10-CM | POA: Diagnosis not present

## 2019-03-17 DIAGNOSIS — Z7982 Long term (current) use of aspirin: Secondary | ICD-10-CM

## 2019-03-17 DIAGNOSIS — E1129 Type 2 diabetes mellitus with other diabetic kidney complication: Secondary | ICD-10-CM | POA: Diagnosis not present

## 2019-03-17 DIAGNOSIS — E1151 Type 2 diabetes mellitus with diabetic peripheral angiopathy without gangrene: Secondary | ICD-10-CM | POA: Diagnosis present

## 2019-03-17 DIAGNOSIS — E1122 Type 2 diabetes mellitus with diabetic chronic kidney disease: Secondary | ICD-10-CM

## 2019-03-17 DIAGNOSIS — R0602 Shortness of breath: Secondary | ICD-10-CM | POA: Diagnosis not present

## 2019-03-17 DIAGNOSIS — Z794 Long term (current) use of insulin: Secondary | ICD-10-CM

## 2019-03-17 DIAGNOSIS — E1140 Type 2 diabetes mellitus with diabetic neuropathy, unspecified: Secondary | ICD-10-CM | POA: Diagnosis present

## 2019-03-17 DIAGNOSIS — Z20828 Contact with and (suspected) exposure to other viral communicable diseases: Secondary | ICD-10-CM | POA: Diagnosis not present

## 2019-03-17 DIAGNOSIS — Z8249 Family history of ischemic heart disease and other diseases of the circulatory system: Secondary | ICD-10-CM

## 2019-03-17 DIAGNOSIS — I5033 Acute on chronic diastolic (congestive) heart failure: Secondary | ICD-10-CM | POA: Diagnosis not present

## 2019-03-17 DIAGNOSIS — I509 Heart failure, unspecified: Secondary | ICD-10-CM | POA: Diagnosis not present

## 2019-03-17 DIAGNOSIS — E039 Hypothyroidism, unspecified: Secondary | ICD-10-CM | POA: Diagnosis present

## 2019-03-17 DIAGNOSIS — N289 Disorder of kidney and ureter, unspecified: Secondary | ICD-10-CM

## 2019-03-17 DIAGNOSIS — Z96641 Presence of right artificial hip joint: Secondary | ICD-10-CM | POA: Diagnosis present

## 2019-03-17 DIAGNOSIS — E8770 Fluid overload, unspecified: Secondary | ICD-10-CM | POA: Diagnosis not present

## 2019-03-17 DIAGNOSIS — N19 Unspecified kidney failure: Secondary | ICD-10-CM | POA: Diagnosis not present

## 2019-03-17 DIAGNOSIS — I110 Hypertensive heart disease with heart failure: Secondary | ICD-10-CM | POA: Diagnosis not present

## 2019-03-17 DIAGNOSIS — R0609 Other forms of dyspnea: Secondary | ICD-10-CM | POA: Diagnosis present

## 2019-03-17 LAB — LIPID PANEL
Cholesterol: 156 mg/dL (ref 0–200)
HDL: 48 mg/dL (ref >40)
LDL Cholesterol: 84 mg/dL (ref 0–99)
Total CHOL/HDL Ratio: 3.3 RATIO
Triglycerides: 121 mg/dL (ref <150)
VLDL: 24 mg/dL (ref 0–40)

## 2019-03-17 LAB — CBC
HCT: 34.1 % — ABNORMAL LOW (ref 36.0–46.0)
HCT: 34.1 % — ABNORMAL LOW (ref 36.0–46.0)
Hemoglobin: 12.1 g/dL (ref 12.0–15.0)
Hemoglobin: 11.2 g/dL — ABNORMAL LOW (ref 12.0–15.0)
MCH: 32 pg (ref 26.0–34.0)
MCH: 29.9 pg (ref 26.0–34.0)
MCHC: 35.5 g/dL (ref 30.0–36.0)
MCHC: 32.8 g/dL (ref 30.0–36.0)
MCV: 90.2 fL (ref 80.0–100.0)
MCV: 90.9 fL (ref 80.0–100.0)
Platelets: 406 10*3/uL — ABNORMAL HIGH (ref 150–400)
Platelets: 370 10*3/uL (ref 150–400)
RBC: 3.78 MIL/uL — ABNORMAL LOW (ref 3.87–5.11)
RBC: 3.75 MIL/uL — ABNORMAL LOW (ref 3.87–5.11)
RDW: 12.5 % (ref 11.5–15.5)
RDW: 12.5 % (ref 11.5–15.5)
WBC: 7.2 10*3/uL (ref 4.0–10.5)
WBC: 7 10*3/uL (ref 4.0–10.5)
nRBC: 0 % (ref 0.0–0.2)
nRBC: 0 % (ref 0.0–0.2)

## 2019-03-17 LAB — CREATININE, SERUM
Creatinine, Ser: 3.15 mg/dL — ABNORMAL HIGH (ref 0.44–1.00)
GFR calc Af Amer: 15 mL/min — ABNORMAL LOW (ref >60)
GFR calc non Af Amer: 13 mL/min — ABNORMAL LOW (ref >60)

## 2019-03-17 LAB — URINALYSIS, ROUTINE W REFLEX MICROSCOPIC
Bacteria, UA: NONE SEEN
Bilirubin Urine: NEGATIVE
Glucose, UA: 50 mg/dL — AB
Hgb urine dipstick: NEGATIVE
Ketones, ur: NEGATIVE mg/dL
Leukocytes,Ua: NEGATIVE
Nitrite: NEGATIVE
Protein, ur: 100 mg/dL — AB
Specific Gravity, Urine: 1.005 (ref 1.005–1.030)
pH: 8 (ref 5.0–8.0)

## 2019-03-17 LAB — HEMOGLOBIN A1C
Hgb A1c MFr Bld: 6.5 % — ABNORMAL HIGH (ref 4.8–5.6)
Mean Plasma Glucose: 139.85 mg/dL

## 2019-03-17 LAB — SARS CORONAVIRUS 2 BY RT PCR (HOSPITAL ORDER, PERFORMED IN ~~LOC~~ HOSPITAL LAB): SARS Coronavirus 2: NEGATIVE

## 2019-03-17 LAB — BASIC METABOLIC PANEL
Anion gap: 11 (ref 5–15)
BUN: 28 mg/dL — ABNORMAL HIGH (ref 8–23)
CO2: 23 mmol/L (ref 22–32)
Calcium: 10.4 mg/dL — ABNORMAL HIGH (ref 8.9–10.3)
Chloride: 98 mmol/L (ref 98–111)
Creatinine, Ser: 3.22 mg/dL — ABNORMAL HIGH (ref 0.44–1.00)
GFR calc Af Amer: 14 mL/min — ABNORMAL LOW (ref >60)
GFR calc non Af Amer: 12 mL/min — ABNORMAL LOW (ref >60)
Glucose, Bld: 251 mg/dL — ABNORMAL HIGH (ref 70–99)
Potassium: 4.4 mmol/L (ref 3.5–5.1)
Sodium: 132 mmol/L — ABNORMAL LOW (ref 135–145)

## 2019-03-17 LAB — TROPONIN I (HIGH SENSITIVITY)
Troponin I (High Sensitivity): 22 ng/L — ABNORMAL HIGH (ref <18)
Troponin I (High Sensitivity): 23 ng/L — ABNORMAL HIGH (ref <18)
Troponin I (High Sensitivity): 22 ng/L — ABNORMAL HIGH (ref <18)

## 2019-03-17 LAB — TSH: TSH: 3.689 u[IU]/mL (ref 0.350–4.500)

## 2019-03-17 LAB — GLUCOSE, CAPILLARY: Glucose-Capillary: 288 mg/dL — ABNORMAL HIGH (ref 70–99)

## 2019-03-17 LAB — BRAIN NATRIURETIC PEPTIDE: B Natriuretic Peptide: 554.6 pg/mL — ABNORMAL HIGH (ref 0.0–100.0)

## 2019-03-17 LAB — CBG MONITORING, ED: Glucose-Capillary: 133 mg/dL — ABNORMAL HIGH (ref 70–99)

## 2019-03-17 LAB — MAGNESIUM: Magnesium: 1.6 mg/dL — ABNORMAL LOW (ref 1.7–2.4)

## 2019-03-17 LAB — PHOSPHORUS: Phosphorus: 3.7 mg/dL (ref 2.5–4.6)

## 2019-03-17 MED ORDER — ACETAMINOPHEN 325 MG PO TABS
650.0000 mg | ORAL_TABLET | Freq: Four times a day (QID) | ORAL | Status: DC | PRN
  Administered 2019-03-18: 650 mg via ORAL
  Filled 2019-03-17: qty 2

## 2019-03-17 MED ORDER — ONDANSETRON HCL 4 MG/2ML IJ SOLN: 4.0000 mg | Freq: Four times a day (QID) | INTRAMUSCULAR | Status: DC | PRN

## 2019-03-17 MED ORDER — NITROGLYCERIN 0.4 MG SL SUBL: 0.4000 mg | SUBLINGUAL_TABLET | SUBLINGUAL | Status: DC | PRN

## 2019-03-17 MED ORDER — ATENOLOL 50 MG PO TABS
50.0000 mg | ORAL_TABLET | Freq: Every day | ORAL | Status: DC
  Administered 2019-03-18 – 2019-03-20 (×3): 50 mg via ORAL
  Filled 2019-03-17 (×3): qty 1

## 2019-03-17 MED ORDER — AMLODIPINE BESYLATE 10 MG PO TABS
10.0000 mg | ORAL_TABLET | Freq: Every day | ORAL | Status: DC
  Administered 2019-03-17 – 2019-03-20 (×4): 10 mg via ORAL
  Filled 2019-03-17 (×4): qty 1

## 2019-03-17 MED ORDER — FUROSEMIDE 10 MG/ML IJ SOLN
40.0000 mg | Freq: Every day | INTRAMUSCULAR | Status: DC
  Administered 2019-03-18: 40 mg via INTRAVENOUS
  Filled 2019-03-17: qty 4

## 2019-03-17 MED ORDER — FUROSEMIDE 10 MG/ML IJ SOLN
40.0000 mg | Freq: Once | INTRAMUSCULAR | Status: AC
  Administered 2019-03-17: 40 mg via INTRAVENOUS
  Filled 2019-03-17: qty 4

## 2019-03-17 MED ORDER — ACETAMINOPHEN 650 MG RE SUPP: 650.0000 mg | Freq: Four times a day (QID) | RECTAL | Status: DC | PRN

## 2019-03-17 MED ORDER — INSULIN ASPART 100 UNIT/ML ~~LOC~~ SOLN: 0.0000 [IU] | Freq: Every day | SUBCUTANEOUS | Status: DC

## 2019-03-17 MED ORDER — ONDANSETRON HCL 4 MG PO TABS: 4.0000 mg | ORAL_TABLET | Freq: Four times a day (QID) | ORAL | Status: DC | PRN

## 2019-03-17 MED ORDER — INSULIN ASPART 100 UNIT/ML ~~LOC~~ SOLN
0.0000 [IU] | Freq: Three times a day (TID) | SUBCUTANEOUS | Status: DC
  Administered 2019-03-17: 2 [IU] via SUBCUTANEOUS
  Administered 2019-03-18 (×2): 3 [IU] via SUBCUTANEOUS
  Administered 2019-03-18: 11 [IU] via SUBCUTANEOUS
  Administered 2019-03-19: 2 [IU] via SUBCUTANEOUS
  Administered 2019-03-19: 3 [IU] via SUBCUTANEOUS
  Administered 2019-03-19: 11 [IU] via SUBCUTANEOUS
  Administered 2019-03-20: 8 [IU] via SUBCUTANEOUS
  Administered 2019-03-20: 2 [IU] via SUBCUTANEOUS

## 2019-03-17 MED ORDER — LEVOTHYROXINE SODIUM 25 MCG PO TABS
25.0000 ug | ORAL_TABLET | Freq: Every day | ORAL | Status: DC
  Administered 2019-03-18 – 2019-03-20 (×3): 25 ug via ORAL
  Filled 2019-03-17 (×3): qty 1

## 2019-03-17 MED ORDER — ENOXAPARIN SODIUM 30 MG/0.3ML ~~LOC~~ SOLN
30.0000 mg | SUBCUTANEOUS | Status: DC
  Filled 2019-03-17: qty 0.3

## 2019-03-17 MED ORDER — ATORVASTATIN CALCIUM 40 MG PO TABS
40.0000 mg | ORAL_TABLET | Freq: Every day | ORAL | Status: DC
  Administered 2019-03-17 – 2019-03-19 (×3): 40 mg via ORAL
  Filled 2019-03-17 (×3): qty 1

## 2019-03-17 NOTE — ED Triage Notes (Signed)
Pt to ER for ongoing SOB and chest pressure over the last few weeks. Reports worse at night lying flat. Reports chest pressure 6/10 at this time. REports seen at [facility] last weekend and was given fluid medication which helped.

## 2019-03-17 NOTE — ED Provider Notes (Signed)
[facility] EMERGENCY DEPARTMENT Provider Note   CSN: [id number] Arrival date & time: 03/17/19  1116     History   Chief Complaint Chief Complaint  Patient presents with  . Shortness of Breath  . Chest Pain    HPI Abigail Huang is a 85 y.o. female.     85 yo F with a chief complaint of shortness of breath.  The been going on for about a week or so.  Worse on exertion and better with rest.  No chest pain or pressure with this.  She has been having some issues lying back flat to sleep.  Wakes her up in the middle the night and she feels significantly short of breath.  The history is provided by the patient.  Shortness of Breath Severity:  Moderate Onset quality:  Gradual Duration:  1 week Timing:  Constant Progression:  Worsening Chronicity:  New Relieved by:  Nothing Worsened by:  Nothing Ineffective treatments:  None tried Associated symptoms: chest pain   Associated symptoms: no fever, no headaches, no vomiting and no wheezing   Chest Pain Associated symptoms: shortness of breath   Associated symptoms: no dizziness, no fever, no headache, no nausea, no palpitations and no vomiting     Past Medical History:  Diagnosis Date  . Diabetes mellitus   . Hyperlipidemia   . Hypertension     Patient Active Problem List   Diagnosis Date Noted  . Essential hypertension 01/06/2017  . Dyspnea on exertion 01/06/2017  . Hyperlipidemia 01/06/2017  . Closed displaced fracture of right femoral neck ([place]) 11/03/2016  . Type 2 diabetes mellitus with stage 4 chronic kidney disease, with long-term current use of insulin ([place]) 11/03/2016  . Hyponatremia 11/03/2016  . Leukocytosis 11/03/2016  . Normocytic anemia 11/03/2016  . CKD (chronic kidney disease), stage IV ([place]) 11/03/2016    Past Surgical History:  Procedure Laterality Date  . ABDOMINAL HYSTERECTOMY    . ANTERIOR APPROACH HEMI HIP ARTHROPLASTY Right 11/04/2016   Procedure: ANTERIOR APPROACH  HEMI HIP ARTHROPLASTY;  Surgeon: Dorna Leitz, MD;  Location: [place];  Service: Orthopedics;  Laterality: Right;  . COLONOSCOPY    . POLYPECTOMY       OB History   No obstetric history on file.      Home Medications    Prior to Admission medications   Medication Sig Start Date End Date Taking? Authorizing Provider  amLODipine (NORVASC) 10 MG tablet Take 10 mg by mouth daily.    [provider]  aspirin EC 325 MG tablet Take 1 tablet (325 mg total) by mouth 2 (two) times daily after a meal. Take x 1 month post op to decrease risk of blood clots. 11/04/16   Gary Fleet, PA-C  atenolol (TENORMIN) 50 MG tablet Take 50 mg by mouth daily.     [provider]  Calcium Carbonate-Vitamin D (CALTRATE 600+D PO) Take 1 tablet by mouth daily.     [provider]  furosemide (LASIX) 20 MG tablet Take 1 tablet (20 mg total) by mouth daily. 03/13/19   Mesner, Corene Cornea, MD  glipiZIDE (GLUCOTROL) 10 MG tablet Take 10 mg by mouth daily before breakfast.    [provider]  insulin detemir (LEVEMIR) 100 UNIT/ML injection Inject 10 Units into the skin daily.    [provider]  levothyroxine (SYNTHROID) 25 MCG tablet  02/13/19   [provider]  Multiple Vitamins-Minerals (CENTRUM SILVER PO) Take 1 tablet by mouth daily.    [provider]  simvastatin (ZOCOR) 20 MG tablet Take 20 mg by mouth daily.    [provider]    Family History Family History  Problem Relation Age of Onset  . Heart disease Mother   . Heart disease Father   . Heart disease Sister     Social History Social History   Tobacco Use  . Smoking status: Former Smoker    Quit date: 06/27/1967    Years since quitting: 51.7  . Smokeless tobacco: Never Used  Substance Use Topics  . Alcohol use: No  . Drug use: No     Allergies   Patient has no known allergies.   Review of Systems Review of Systems  Constitutional: Negative for chills and fever.  HENT:  Negative for congestion and rhinorrhea.   Eyes: Negative for redness and visual disturbance.  Respiratory: Positive for shortness of breath. Negative for wheezing.   Cardiovascular: Positive for chest pain. Negative for palpitations.  Gastrointestinal: Negative for nausea and vomiting.  Genitourinary: Negative for dysuria and urgency.  Musculoskeletal: Negative for arthralgias and myalgias.  Skin: Negative for pallor and wound.  Neurological: Negative for dizziness and headaches.     Physical Exam Updated Vital Signs BP (!) 171/71   Pulse 74   Temp 97.9 F (36.6 C)   Resp 20   SpO2 100%   Physical Exam Vitals signs and nursing note reviewed.  Constitutional:      General: She is not in acute distress.    Appearance: She is well-developed. She is not diaphoretic.  HENT:     Head: Normocephalic and atraumatic.  Eyes:     Pupils: Pupils are equal, round, and reactive to light.  Neck:     Musculoskeletal: Normal range of motion and neck supple.  Cardiovascular:     Rate and Rhythm: Normal rate and regular rhythm.     Heart sounds: No murmur. No friction rub. No gallop.   Pulmonary:     Effort: Pulmonary effort is normal.     Breath sounds: Examination of the right-lower field reveals rales. Examination of the left-lower field reveals rales. Rales present. No wheezing.     Comments: Faint rales in the bases Abdominal:     General: There is no distension.     Palpations: Abdomen is soft.     Tenderness: There is no abdominal tenderness.  Musculoskeletal:        General: No tenderness.  Skin:    General: Skin is warm and dry.  Neurological:     Mental Status: She is alert and oriented to person, place, and time.  Psychiatric:        Behavior: Behavior normal.      ED Treatments / Results  Labs (all labs ordered are listed, but only abnormal results are displayed) Labs Reviewed  BASIC METABOLIC PANEL - Abnormal; Notable for the following components:      Result  Value   Sodium 132 (*)    Glucose, Bld 251 (*)    BUN 28 (*)    Creatinine, Ser 3.22 (*)    Calcium 10.4 (*)    GFR calc non Af Amer 12 (*)    GFR calc Af Amer 14 (*)    All other components within normal limits  CBC - Abnormal; Notable for the following components:   RBC 3.78 (*)    HCT 34.1 (*)    Platelets 406 (*)    All other components within normal limits  TROPONIN I (HIGH  SENSITIVITY) - Abnormal; Notable for the following components:   Troponin I (High Sensitivity) 22 (*)    All other components within normal limits  SARS CORONAVIRUS 2 (HOSPITAL ORDER, PERFORMED IN [facility])  BRAIN NATRIURETIC PEPTIDE  TROPONIN I (HIGH SENSITIVITY)    EKG EKG Interpretation  Date/Time:  Friday March 17 2019 11:26:27 EDT Ventricular Rate:  76 PR Interval:  186 QRS Duration: 132 QT Interval:  396 QTC Calculation: 445 R Axis:   -8 Text Interpretation:  Normal sinus rhythm with sinus arrhythmia Left bundle branch block Abnormal ECG No significant change since last tracing Confirmed by Deno Etienne [phone]) on 03/17/2019 1:47:56 PM   Radiology Dg Chest 2 View  Result Date: 03/17/2019 CLINICAL DATA:  Shortness of breath, fullness, chest pain. EXAM: CHEST - 2 VIEW COMPARISON:  03/12/2019 and 11/04/2016 FINDINGS: No signs of consolidation or pleural effusion. Linear opacity in left lung base may represent scarring or atelectasis. Heart size is stable, mildly enlarged with atherosclerotic changes in the aorta. No acute bone process. IMPRESSION: No active cardiopulmonary disease. Electronically Signed   By: Zetta Bills M.D.   On: 03/17/2019 12:10    Procedures Procedures (including critical care time)  Medications Ordered in ED Medications  furosemide (LASIX) injection 40 mg (40 mg Intravenous Given 03/17/19 1504)     Initial Impression / Assessment and Plan / ED Course  I have reviewed the triage vital signs and the nursing notes.  Pertinent labs & imaging results  that were available during my care of the patient were reviewed by me and considered in my medical decision making (see chart for details).        85 yo F with a cc of sob.  Going on for the past week or so.  Seen in the ED now x3 for this. Patient seen previous ed visit concerning for new heart failure, had improvement and was discharged home.Continue to feel unwell, no chest pain or pressure.  No cough or fever.   CXR without obvious change from prior.  Slowly worsening renal function.  Trop positive compared to last week though mildly so.   With ongoing symptoms, concern for new heart failure even though mildly improved from last visit, will discuss with hospitalist.   The patients results and plan were reviewed and discussed.   Any x-rays performed were independently reviewed by myself.   Differential diagnosis were considered with the presenting HPI.  Medications  furosemide (LASIX) injection 40 mg (40 mg Intravenous Given 03/17/19 1504)    Vitals:   03/17/19 1127 03/17/19 1340 03/17/19 1435 03/17/19 1501  BP: (!) 154/76 (!) 177/72 (!) 147/71 (!) 171/71  Pulse: 72 74 82 74  Resp: 18 14 20 20   Temp: 97.9 F (36.6 C)     SpO2: 100% 100% 99% 100%    Final diagnoses:  Acute on chronic diastolic congestive heart failure (HCC)    Admission/ observation were discussed with the admitting physician, patient and/or family and they are comfortable with the plan.      Final Clinical Impressions(s) / ED Diagnoses   Final diagnoses:  Acute on chronic diastolic congestive heart failure [facility])    ED Discharge Orders    None       Deno Etienne, DO 03/17/19 1514

## 2019-03-17 NOTE — H&P (Signed)
History and Physical    Abigail Huang YSA:[id number] DOB: 09-29-1930 DOA: 03/17/2019  PCP: Burnard Bunting, MD  Patient coming from: Home I have personally briefly reviewed patient's old medical records in [facility]  Chief Complaint: Shortness of breath, chest pressure, leg swelling since 1 week. HPI: Abigail Huang is a 85 y.o. female with medical history significant of hypertension, diabetes, chronic kidney disease stage IIIb, diabetic neuropathy, chronic venous stasis presents to emergency department due to worsening shortness of breath on exertion and chest tightness since 1 week.  Associated with leg swelling, orthopnea, PND however denies association with palpitation, cough, congestion, fever, chills, COVID 19 exposure, previous history of CHF.  She had similar symptoms last week and went to [facility] and received IV Lasix and her symptoms improved. Reports that her PCP recently changed her blood pressure medicine Maxzide due to worsening kidney function and was asked to increase fluid intake.  Patient reports that her symptoms improved with Lasix.   She lives with her husband and denies smoking, alcohol, illicit drug use.  She uses walker when she goes outside.  ED Course: Troponin x2 slightly elevated (22-->23).  proBNP is elevated at 554.  Chest x-ray is negative for fluid overload.  Patient received 40 mg of IV Lasix once in the ED.  Vital signs stable.  GFR declined from 15-12 and creatinine went up from 2.84-3.22 in 1 week.  Review of Systems: As per HPI otherwise negative.    Past Medical History:  Diagnosis Date  . Diabetes mellitus   . Hyperlipidemia   . Hypertension     Past Surgical History:  Procedure Laterality Date  . ABDOMINAL HYSTERECTOMY    . ANTERIOR APPROACH HEMI HIP ARTHROPLASTY Right 11/04/2016   Procedure: ANTERIOR APPROACH HEMI HIP ARTHROPLASTY;  Surgeon: Dorna Leitz, MD;  Location: [place];  Service: Orthopedics;   Laterality: Right;  . COLONOSCOPY    . POLYPECTOMY       reports that she quit smoking about 51 years ago. She has never used smokeless tobacco. She reports that she does not drink alcohol or use drugs.  No Known Allergies  Family History  Problem Relation Age of Onset  . Heart disease Mother   . Heart disease Father   . Heart disease Sister     Prior to Admission medications   Medication Sig Start Date End Date Taking? Authorizing Provider  amLODipine (NORVASC) 10 MG tablet Take 10 mg by mouth daily.   Yes [provider]  atenolol (TENORMIN) 50 MG tablet Take 50 mg by mouth daily.    Yes [provider]  Calcium Carbonate-Vitamin D (CALTRATE 600+D PO) Take 1 tablet by mouth daily.    Yes [provider]  furosemide (LASIX) 20 MG tablet Take 1 tablet (20 mg total) by mouth daily. 03/13/19  Yes Mesner, Corene Cornea, MD  glipiZIDE (GLUCOTROL XL) 10 MG 24 hr tablet Take 10 mg by mouth daily with breakfast.   Yes [provider]  insulin detemir (LEVEMIR) 100 UNIT/ML injection Inject 8 Units into the skin daily.    Yes [provider]  levothyroxine (SYNTHROID) 25 MCG tablet  02/13/19  Yes [provider]  Multiple Vitamins-Minerals (CENTRUM SILVER PO) Take 1 tablet by mouth daily.   Yes [provider]  simvastatin (ZOCOR) 20 MG tablet Take 20 mg by mouth daily.   Yes [provider]    Physical Exam: Vitals:   03/17/19 1127 03/17/19 1340 03/17/19 1435 03/17/19  1501  BP: (!) 154/76 (!) 177/72 (!) 147/71 (!) 171/71  Pulse: 72 74 82 74  Resp: 18 14 20 20   Temp: 97.9 F (36.6 C)     SpO2: 100% 100% 99% 100%    Constitutional: NAD, calm, comfortable Vitals:   03/17/19 1127 03/17/19 1340 03/17/19 1435 03/17/19 1501  BP: (!) 154/76 (!) 177/72 (!) 147/71 (!) 171/71  Pulse: 72 74 82 74  Resp: 18 14 20 20   Temp: 97.9 F (36.6 C)     SpO2: 100% 100% 99% 100%   Constitutional: Alert and oriented x3, not in acute  distress, communicating well.   Eyes: PERRL, lids and conjunctivae normal ENMT: Mucous membranes are moist. Posterior pharynx clear of any exudate or lesions.Normal dentition.  Neck: normal, supple, no masses, no thyromegaly Respiratory: clear to auscultation bilaterally, no wheezing, no crackles. Normal respiratory effort. No accessory muscle use.  Cardiovascular: Regular rate and rhythm, no murmurs / rubs / gallops.  Bilateral 2+ pitting edema positive. 2+ pedal pulses. No carotid bruits.  Abdomen: no tenderness, no masses palpated. No hepatosplenomegaly. Bowel sounds positive.  Musculoskeletal: no clubbing / cyanosis. No joint deformity upper and lower extremities. Good ROM, no contractures. Normal muscle tone.  Skin: no rashes, lesions, ulcers. No induration Neurologic: CN 2-12 grossly intact. Sensation intact, DTR normal. Strength 5/5 in all 4.  Psychiatric: Normal judgment and insight. Alert and oriented x 3. Normal mood.    Labs on Admission: I have personally reviewed following labs and imaging studies  CBC: Recent Labs  Lab 03/12/19 0303 03/17/19 1148 03/17/19 1558  WBC 12.5* 7.2 7.0  NEUTROABS 10.3*  --   --   HGB 10.7* 12.1 11.2*  HCT 33.2* 34.1* 34.1*  MCV 91.2 90.2 90.9  PLT 323 406* 625   Basic Metabolic Panel: Recent Labs  Lab 03/12/19 0303 03/17/19 1148  NA 129* 132*  K 4.2 4.4  CL 97* 98  CO2 20* 23  GLUCOSE 190* 251*  BUN 28* 28*  CREATININE 2.84* 3.22*  CALCIUM 9.8 10.4*   GFR: Estimated Creatinine Clearance: 11.1 mL/min (A) (by C-G formula based on SCr of 3.22 mg/dL (H)). Liver Function Tests: Recent Labs  Lab 03/12/19 0303  AST 19  ALT 15  ALKPHOS 56  BILITOT 0.6  PROT 7.1  ALBUMIN 3.4*   No results for input(s): LIPASE, AMYLASE in the last 168 hours. No results for input(s): AMMONIA in the last 168 hours. Coagulation Profile: No results for input(s): INR, PROTIME in the last 168 hours. Cardiac Enzymes: No results for input(s): CKTOTAL,  CKMB, CKMBINDEX, TROPONINI in the last 168 hours. BNP (last 3 results) No results for input(s): PROBNP in the last 8760 hours. HbA1C: Recent Labs    03/17/19 1558  HGBA1C 6.5*   CBG: No results for input(s): GLUCAP in the last 168 hours. Lipid Profile: No results for input(s): CHOL, HDL, LDLCALC, TRIG, CHOLHDL, LDLDIRECT in the last 72 hours. Thyroid Function Tests: No results for input(s): TSH, T4TOTAL, FREET4, T3FREE, THYROIDAB in the last 72 hours. Anemia Panel: No results for input(s): VITAMINB12, FOLATE, FERRITIN, TIBC, IRON, RETICCTPCT in the last 72 hours. Urine analysis:    Component Value Date/Time   COLORURINE YELLOW 03/12/2019 0320   APPEARANCEUR CLEAR 03/12/2019 0320   LABSPEC 1.015 03/12/2019 0320   PHURINE 7.0 03/12/2019 0320   GLUCOSEU 100 (A) 03/12/2019 0320   HGBUR SMALL (A) 03/12/2019 0320   BILIRUBINUR NEGATIVE 03/12/2019 0320   KETONESUR NEGATIVE 03/12/2019 0320   PROTEINUR >300 (A)  03/12/2019 0320   UROBILINOGEN 0.2 03/08/2012 1613   NITRITE NEGATIVE 03/12/2019 0320   LEUKOCYTESUR NEGATIVE 03/12/2019 0320    Radiological Exams on Admission: Dg Chest 2 View  Result Date: 03/17/2019 CLINICAL DATA:  Shortness of breath, fullness, chest pain. EXAM: CHEST - 2 VIEW COMPARISON:  03/12/2019 and 11/04/2016 FINDINGS: No signs of consolidation or pleural effusion. Linear opacity in left lung base may represent scarring or atelectasis. Heart size is stable, mildly enlarged with atherosclerotic changes in the aorta. No acute bone process. IMPRESSION: No active cardiopulmonary disease. Electronically Signed   By: Zetta Bills M.D.   On: 03/17/2019 12:10   US Renal  Result Date: 03/17/2019 CLINICAL DATA:  Worsening renal failure EXAM: RENAL / URINARY TRACT ULTRASOUND COMPLETE COMPARISON:  CT 12/29/2010 FINDINGS: Right Kidney: Renal measurements: 10.2 x 4.8 x 4.8 cm = volume: 123 mL. Mild hydronephrosis. Increased renal cortical echogenicity. No mass visualized. Left  Kidney: Renal measurements: 7.6 x 3.8 x 3.2 cm = volume: 48 mL. Diffuse renal cortical atrophy with increased echogenicity. Trace left perinephric fluid no mass or hydronephrosis visualized. Bladder: Appears normal for degree of bladder distention. IMPRESSION: 1. Mild right hydronephrosis. 2. The left kidney is atrophic relative to the right. 3. Trace left perinephric fluid.  No left-sided hydronephrosis. 4. Increased bilateral renal parenchymal echogenicity suggesting chronic medical renal disease. Electronically Signed   By: Davina Poke M.D.   On: 03/17/2019 16:39    EKG: Normal sinus rhythm, left bundle branch block, no acute ST-T wave  elevation or depression noted.  Assessment/Plan Principal Problem:   Acute CHF (congestive heart failure) (HCC) Active Problems:   Diabetes mellitus (HCC)   CKD (chronic kidney disease), stage IV (HCC)   Dyspnea on exertion   Hyperlipidemia   Hypothyroidism    Acute CHF: -Patient presented with shortness of breath, leg swelling, orthopnea, PND.  proBNP is elevated at 554.  Chest x-ray is negative -Patient received Lasix 40 mg IV once in ED. -Admit patient on telemetry, strict INO's and daily weight. -Start on Lasix 40 mg IV daily. -We will get transthoracic echo -Check magnesium, phosphorus level.  Monitor electrolytes closely. -Replace potassium and magnesium as indicated.  Chest tightness: -Troponin is slightly elevated-could be secondary to demand ischemia versus worsening kidney function.  EKG no acute changes. -Nitro PRN for chest pain.  On continuous pulse ox, -We will trend troponin.  On telemetry.  CKD stage IV: Worsening -Could be secondary to underlying CHF. -We will avoid nephrotoxic medication -We will get renal ultrasound and monitor kidney function closely. -Consider nephrology consult if her kidney function continue to decline  Diabetes mellitus: -On glipizide and Levemir 8 units daily. -We will check A1c and start patient  on sliding scale insulin -Monitor blood glucose closely.  Hyperlipidemia: Check lipid panel -Started on atorvastatin 40 mg once daily.  Hypothyroidism: Check TSH -Continue levothyroxine 25 mcg once daily.  Hypertension: Blood pressure is stable -Continue amlodipine and atenolol. -Monitor blood pressure closely.   DVT prophylaxis: Lovenox/TED/SCD Code Status: Partial code, no chest compressions, no intubation, no shock, yes to IV pressor and BiPAP Family Communication: Daughter present at bedside.  Plan of care discussed with patient in length and both verbalized understanding and agreed with it. Disposition Plan: TBD Consults called: None Admission status: Inpatient  Mckinley Jewel MD [facility] Pager [phone]  If 7PM-7AM, please contact night-coverage [email or web address] Password [facility]  03/17/2019, 4:42 PM

## 2019-03-17 NOTE — ED Notes (Signed)
DINNER TRAY ORDERED

## 2019-03-17 NOTE — ED Notes (Signed)
pts daughter [phone]  Asking to be called when any doctor sees her mother

## 2019-03-18 ENCOUNTER — Encounter (HOSPITAL_COMMUNITY): Payer: Self-pay | Admitting: *Deleted

## 2019-03-18 ENCOUNTER — Inpatient Hospital Stay (HOSPITAL_COMMUNITY): Payer: Medicare HMO

## 2019-03-18 DIAGNOSIS — N189 Chronic kidney disease, unspecified: Secondary | ICD-10-CM

## 2019-03-18 DIAGNOSIS — N179 Acute kidney failure, unspecified: Secondary | ICD-10-CM

## 2019-03-18 DIAGNOSIS — R0602 Shortness of breath: Secondary | ICD-10-CM

## 2019-03-18 DIAGNOSIS — I5033 Acute on chronic diastolic (congestive) heart failure: Secondary | ICD-10-CM

## 2019-03-18 LAB — CBC
HCT: 33.5 % — ABNORMAL LOW (ref 36.0–46.0)
Hemoglobin: 10.8 g/dL — ABNORMAL LOW (ref 12.0–15.0)
MCH: 29.3 pg (ref 26.0–34.0)
MCHC: 32.2 g/dL (ref 30.0–36.0)
MCV: 91 fL (ref 80.0–100.0)
Platelets: 373 10*3/uL (ref 150–400)
RBC: 3.68 MIL/uL — ABNORMAL LOW (ref 3.87–5.11)
RDW: 12.5 % (ref 11.5–15.5)
WBC: 6.8 10*3/uL (ref 4.0–10.5)
nRBC: 0 % (ref 0.0–0.2)

## 2019-03-18 LAB — COMPREHENSIVE METABOLIC PANEL
ALT: 11 U/L (ref 0–44)
AST: 15 U/L (ref 15–41)
Albumin: 3.1 g/dL — ABNORMAL LOW (ref 3.5–5.0)
Alkaline Phosphatase: 53 U/L (ref 38–126)
Anion gap: 9 (ref 5–15)
BUN: 29 mg/dL — ABNORMAL HIGH (ref 8–23)
CO2: 23 mmol/L (ref 22–32)
Calcium: 9.8 mg/dL (ref 8.9–10.3)
Chloride: 101 mmol/L (ref 98–111)
Creatinine, Ser: 3.12 mg/dL — ABNORMAL HIGH (ref 0.44–1.00)
GFR calc Af Amer: 15 mL/min — ABNORMAL LOW (ref >60)
GFR calc non Af Amer: 13 mL/min — ABNORMAL LOW (ref >60)
Glucose, Bld: 206 mg/dL — ABNORMAL HIGH (ref 70–99)
Potassium: 4 mmol/L (ref 3.5–5.1)
Sodium: 133 mmol/L — ABNORMAL LOW (ref 135–145)
Total Bilirubin: 0.6 mg/dL (ref 0.3–1.2)
Total Protein: 6.6 g/dL (ref 6.5–8.1)

## 2019-03-18 LAB — GLUCOSE, CAPILLARY
Glucose-Capillary: 162 mg/dL — ABNORMAL HIGH (ref 70–99)
Glucose-Capillary: 172 mg/dL — ABNORMAL HIGH (ref 70–99)
Glucose-Capillary: 332 mg/dL — ABNORMAL HIGH (ref 70–99)
Glucose-Capillary: 110 mg/dL — ABNORMAL HIGH (ref 70–99)

## 2019-03-18 LAB — ECHOCARDIOGRAM COMPLETE
Height: 64 in
Weight: 2388.8 oz

## 2019-03-18 LAB — TROPONIN I (HIGH SENSITIVITY)
Troponin I (High Sensitivity): 17 ng/L (ref <18)
Troponin I (High Sensitivity): 19 ng/L — ABNORMAL HIGH (ref <18)

## 2019-03-18 MED ORDER — TRAZODONE HCL 50 MG PO TABS
50.0000 mg | ORAL_TABLET | Freq: Once | ORAL | Status: AC
  Administered 2019-03-18: 50 mg via ORAL
  Filled 2019-03-18: qty 1

## 2019-03-18 MED ORDER — FUROSEMIDE 10 MG/ML IJ SOLN
80.0000 mg | Freq: Two times a day (BID) | INTRAMUSCULAR | Status: DC
  Administered 2019-03-18 – 2019-03-19 (×2): 80 mg via INTRAVENOUS
  Filled 2019-03-18 (×2): qty 8

## 2019-03-18 MED ORDER — PERFLUTREN LIPID MICROSPHERE
1.0000 mL | INTRAVENOUS | Status: AC | PRN
  Administered 2019-03-18: 2 mL via INTRAVENOUS
  Filled 2019-03-18: qty 10

## 2019-03-18 MED ORDER — ENOXAPARIN SODIUM 30 MG/0.3ML ~~LOC~~ SOLN
30.0000 mg | SUBCUTANEOUS | Status: DC
  Administered 2019-03-18: 30 mg via SUBCUTANEOUS
  Filled 2019-03-18: qty 0.3

## 2019-03-18 MED ORDER — INSULIN GLARGINE 100 UNIT/ML ~~LOC~~ SOLN
10.0000 [IU] | Freq: Every day | SUBCUTANEOUS | Status: DC
  Administered 2019-03-18 – 2019-03-20 (×3): 10 [IU] via SUBCUTANEOUS
  Filled 2019-03-18 (×3): qty 0.1

## 2019-03-18 MED ORDER — HEPARIN SODIUM (PORCINE) 5000 UNIT/ML IJ SOLN
5000.0000 [IU] | Freq: Three times a day (TID) | INTRAMUSCULAR | Status: DC
  Administered 2019-03-19 – 2019-03-20 (×4): 5000 [IU] via SUBCUTANEOUS
  Filled 2019-03-18 (×3): qty 1

## 2019-03-18 MED ORDER — ACETAMINOPHEN 325 MG PO TABS: 650.0000 mg | ORAL_TABLET | ORAL | Status: DC | PRN

## 2019-03-18 NOTE — Progress Notes (Signed)
Echo with contrast completed

## 2019-03-18 NOTE — Progress Notes (Signed)
PROGRESS NOTE    Abigail Huang  EML:[id number] DOB: 05/20/1931 DOA: 03/17/2019 PCP: Burnard Bunting, MD  Outpatient Specialists:   Brief Narrative:  Abigail Huang is a 85 y.o. female with medical history significant of hypertension, diabetes, chronic kidney disease stage Iv, diabetic neuropathy and chronic venous stasis.  Patient serum creatinine was 1.98 on 11/08/2016.  Patient was recently seen at one of the [facility] facilities with a serum creatinine of 2.84 on 03/12/2019.  The plan was for the patient to follow-up with nephrology team on outpatient basis but patient has been admitted with shortness of breath.  Patient was admitted with 1 week history of shortness of breath, chest pressure and leg swelling.  On presentation to the hospital, serum creatinine was 3.15, cardiac BNP of 554.  Patient is currently being diuresed with improvement in symptoms.  Echocardiogram revealed possible diastolic dysfunction, with normal EF.  Further management will depend on hospital course.  Discussed with patient's daughter.  Assessment & Plan:   Principal Problem:   Acute CHF (congestive heart failure) (HCC) Active Problems:   Diabetes mellitus (HCC)   CKD (chronic kidney disease), stage IV (HCC)   Dyspnea on exertion   Hyperlipidemia   Hypothyroidism  Likely acute diastolic congestive heart failure versus acute on chronic diastolic congestive heart failure:  -Patient presented with shortness of breath, leg swelling, orthopnea, PND.  proBNP is elevated at 554.  Chest x-ray is negative -Patient received Lasix 40 mg IV once in ED. -Admit patient on telemetry, strict INO's and daily weight. -Start on Lasix 40 mg IV daily. -We will get transthoracic echo -Check magnesium, phosphorus level.  Monitor electrolytes closely. -Replace potassium and magnesium as indicated. 03/18/2019: Increase IV Lasix to 80 mg twice daily.  Leg edema is improving.  Shortness of breath is improving.   Echocardiogram result is noted.  EF is 60 to 65%.  Left ventricular diastolic Doppler parameters are indeterminate pertinent for LV diastolic filling.  RVSP of 35 mmHg was reported.  Chest tightness: -Troponin is slightly elevated-could be secondary to demand ischemia versus worsening kidney function.  EKG no acute changes. -Nitro PRN for chest pain.  On continuous pulse ox, -We will trend troponin.  On telemetry. 03/18/2019: Chest tightness is resolved.  Continue to follow troponin.  Acute kidney injury on CKD 4: -Could be secondary to underlying CHF. -We will avoid nephrotoxic medication -We will get renal ultrasound and monitor kidney function closely. -Consider nephrology consult if her kidney function continue to decline 03/18/2019: AKI could be cardiorenal.  Continue IV diuretics as above.  Monitor renal function and electrolytes closely.  Have a low threshold to consult the nephrology team.    Diabetes mellitus: -On glipizide and Levemir 8 units daily. -We will check A1c and start patient on sliding scale insulin -Monitor blood glucose closely. 03/18/2019: Add subcutaneous Lantus 10 units daily.  Patient is on sliding scale insulin coverage.  Hyperlipidemia:  Check lipid panel -Started on atorvastatin 40 mg once daily. 03/18/2019: Total cholesterol is 156, HDL of 48 and LDL of 84.  Continue atorvastatin.  Hypothyroidism:  Check TSH -Continue levothyroxine 25 mcg once daily. 03/18/2019: TSH is 3.689.  Hypertension:  Blood pressure is stable -Continue amlodipine and atenolol. -Monitor blood pressure closely. 03/18/2019: Continue to optimize blood pressure control.  Goal blood pressure should be less than 130/80 mmHg.   DVT prophylaxis:  Change subacute Lovenox to subcutaneous heparin.  Code Status: Partial code, no chest compressions, no intubation, no shock, yes to IV  pressor and BiPAP Family Communication: Daughter  Disposition Plan:  This will depend on  hospital course.  Likely discharge back home.   Consults called: None  Subjective: Patient seen. Shortness of breath has improved. No chest pressure. Leg edema is improving.  Objective: Vitals:   03/17/19 2004 03/18/19 0349 03/18/19 0424 03/18/19 0928  BP: (!) 157/71 (!) 164/73  (!) 159/78  Pulse: 85 66  69  Resp: 16 18  18   Temp: 98.1 F (36.7 C) 97.7 F (36.5 C)  97.7 F (36.5 C)  TempSrc: Oral Oral  Oral  SpO2: 100% 98%  99%  Weight:  67.7 kg    Height: 5\' 4"  (1.626 m)  5\' 4"  (1.626 m)     Intake/Output Summary (Last 24 hours) at 03/18/2019 1448 Last data filed at 03/18/2019 1000 Gross per 24 hour  Intake 580 ml  Output 1900 ml  Net -1320 ml   Filed Weights   03/18/19 0349  Weight: 67.7 kg    Examination:  General exam: Appears calm and comfortable. Respiratory system: Clear to auscultation.  Cardiovascular system: S1 & S2 heard Gastrointestinal system: Abdomen is nondistended, soft and nontender. No organomegaly or masses felt. Normal bowel sounds heard. Central nervous system: Awake and alert.  Patient moves all extremities.   Extremities: 1-1+ bilateral lower extremity edema, with chronic skin changes.  Leg edema is likely chronic.  Data Reviewed: I have personally reviewed following labs and imaging studies  CBC: Recent Labs  Lab 03/12/19 0303 03/17/19 1148 03/17/19 1558 03/18/19 0401  WBC 12.5* 7.2 7.0 6.8  NEUTROABS 10.3*  --   --   --   HGB 10.7* 12.1 11.2* 10.8*  HCT 33.2* 34.1* 34.1* 33.5*  MCV 91.2 90.2 90.9 91.0  PLT 323 406* 370 962   Basic Metabolic Panel: Recent Labs  Lab 03/12/19 0303 03/17/19 1148 03/17/19 1558 03/18/19 0401  NA 129* 132*  --  133*  K 4.2 4.4  --  4.0  CL 97* 98  --  101  CO2 20* 23  --  23  GLUCOSE 190* 251*  --  206*  BUN 28* 28*  --  29*  CREATININE 2.84* 3.22* 3.15* 3.12*  CALCIUM 9.8 10.4*  --  9.8  MG  --   --  1.6*  --   PHOS  --   --  3.7  --    GFR: Estimated Creatinine Clearance: 12.2 mL/min  (A) (by C-G formula based on SCr of 3.12 mg/dL (H)). Liver Function Tests: Recent Labs  Lab 03/12/19 0303 03/18/19 0401  AST 19 15  ALT 15 11  ALKPHOS 56 53  BILITOT 0.6 0.6  PROT 7.1 6.6  ALBUMIN 3.4* 3.1*   No results for input(s): LIPASE, AMYLASE in the last 168 hours. No results for input(s): AMMONIA in the last 168 hours. Coagulation Profile: No results for input(s): INR, PROTIME in the last 168 hours. Cardiac Enzymes: No results for input(s): CKTOTAL, CKMB, CKMBINDEX, TROPONINI in the last 168 hours. BNP (last 3 results) No results for input(s): PROBNP in the last 8760 hours. HbA1C: Recent Labs    03/17/19 1558  HGBA1C 6.5*   CBG: Recent Labs  Lab 03/17/19 1716 03/17/19 2038 03/18/19 0658 03/18/19 1206  GLUCAP 133* 288* 162* 172*   Lipid Profile: Recent Labs    03/17/19 1558  CHOL 156  HDL 48  LDLCALC 84  TRIG 121  CHOLHDL 3.3   Thyroid Function Tests: Recent Labs    03/17/19 1558  TSH  3.689   Anemia Panel: No results for input(s): VITAMINB12, FOLATE, FERRITIN, TIBC, IRON, RETICCTPCT in the last 72 hours. Urine analysis:    Component Value Date/Time   COLORURINE COLORLESS (A) 03/17/2019 1915   APPEARANCEUR CLEAR 03/17/2019 1915   LABSPEC 1.005 03/17/2019 1915   PHURINE 8.0 03/17/2019 1915   GLUCOSEU 50 (A) 03/17/2019 [place] NEGATIVE 03/17/2019 [place] 03/17/2019 [place] 03/17/2019 1915   PROTEINUR 100 (A) 03/17/2019 1915   UROBILINOGEN 0.2 03/08/2012 1613   NITRITE NEGATIVE 03/17/2019 1915   LEUKOCYTESUR NEGATIVE 03/17/2019 1915   Sepsis Labs: @LABRCNTIP (procalcitonin:4,lacticidven:4)  ) Recent Results (from the past 240 hour(s))  SARS Coronavirus 2 [facility] order, Performed in [facility] hospital lab) Nasopharyngeal Nasopharyngeal Swab     Status: None   Collection Time: 03/17/19  2:25 PM   Specimen: Nasopharyngeal Swab  Result Value Ref Range Status   SARS Coronavirus 2 NEGATIVE  NEGATIVE Final    Comment: (NOTE) If result is NEGATIVE SARS-CoV-2 target nucleic acids are NOT DETECTED. The SARS-CoV-2 RNA is generally detectable in upper and lower  respiratory specimens during the acute phase of infection. The lowest  concentration of SARS-CoV-2 viral copies this assay can detect is 250  copies / mL. A negative result does not preclude SARS-CoV-2 infection  and should not be used as the sole basis for treatment or other  patient management decisions.  A negative result may occur with  improper specimen collection / handling, submission of specimen other  than nasopharyngeal swab, presence of viral mutation(s) within the  areas targeted by this assay, and inadequate number of viral copies  (<250 copies / mL). A negative result must be combined with clinical  observations, patient history, and epidemiological information. If result is POSITIVE SARS-CoV-2 target nucleic acids are DETECTED. The SARS-CoV-2 RNA is generally detectable in upper and lower  respiratory specimens dur ing the acute phase of infection.  Positive  results are indicative of active infection with SARS-CoV-2.  Clinical  correlation with patient history and other diagnostic information is  necessary to determine patient infection status.  Positive results do  not rule out bacterial infection or co-infection with other viruses. If result is PRESUMPTIVE POSTIVE SARS-CoV-2 nucleic acids MAY BE PRESENT.   A presumptive positive result was obtained on the submitted specimen  and confirmed on repeat testing.  While 2019 novel coronavirus  (SARS-CoV-2) nucleic acids may be present in the submitted sample  additional confirmatory testing may be necessary for epidemiological  and / or clinical management purposes  to differentiate between  SARS-CoV-2 and other Sarbecovirus currently known to infect humans.  If clinically indicated additional testing with an alternate test  methodology [phone]) is  advised. The SARS-CoV-2 RNA is generally  detectable in upper and lower respiratory sp ecimens during the acute  phase of infection. The expected result is Negative. Fact Sheet for Patients:  StrictlyIdeas.no Fact Sheet for Healthcare Providers: BankingDealers.co.za This test is not yet approved or cleared by the [place] FDA and has been authorized for detection and/or diagnosis of SARS-CoV-2 by FDA under an Emergency Use Authorization (EUA).  This EUA will remain in effect (meaning this test can be used) for the duration of the COVID-19 declaration under Section 564(b)(1) of the Act, 21 U.S.C. section 360bbb-3(b)(1), unless the authorization is terminated or revoked sooner. Performed at [facility], [place] [address] [place], [place] [address]          Radiology  Studies: Dg Chest 2 View  Result Date: 03/17/2019 CLINICAL DATA:  Shortness of breath, fullness, chest pain. EXAM: CHEST - 2 VIEW COMPARISON:  03/12/2019 and 11/04/2016 FINDINGS: No signs of consolidation or pleural effusion. Linear opacity in left lung base may represent scarring or atelectasis. Heart size is stable, mildly enlarged with atherosclerotic changes in the aorta. No acute bone process. IMPRESSION: No active cardiopulmonary disease. Electronically Signed   By: Zetta Bills M.D.   On: 03/17/2019 12:10   US Renal  Result Date: 03/17/2019 CLINICAL DATA:  Worsening renal failure EXAM: RENAL / URINARY TRACT ULTRASOUND COMPLETE COMPARISON:  CT 12/29/2010 FINDINGS: Right Kidney: Renal measurements: 10.2 x 4.8 x 4.8 cm = volume: 123 mL. Mild hydronephrosis. Increased renal cortical echogenicity. No mass visualized. Left Kidney: Renal measurements: 7.6 x 3.8 x 3.2 cm = volume: 48 mL. Diffuse renal cortical atrophy with increased echogenicity. Trace left perinephric fluid no mass or hydronephrosis visualized. Bladder: Appears normal for degree of bladder distention.  IMPRESSION: 1. Mild right hydronephrosis. 2. The left kidney is atrophic relative to the right. 3. Trace left perinephric fluid.  No left-sided hydronephrosis. 4. Increased bilateral renal parenchymal echogenicity suggesting chronic medical renal disease. Electronically Signed   By: Davina Poke M.D.   On: 03/17/2019 16:39        Scheduled Meds: . amLODipine  10 mg Oral Daily  . atenolol  50 mg Oral Daily  . atorvastatin  40 mg Oral q1800  . enoxaparin (LOVENOX) injection  30 mg Subcutaneous Q24H  . furosemide  80 mg Intravenous BID  . insulin aspart  0-15 Units Subcutaneous TID WC  . insulin aspart  0-5 Units Subcutaneous QHS  . levothyroxine  25 mcg Oral Q0600   Continuous Infusions:   LOS: 1 day    Time spent: 35 minutes    Dana Allan, MD  [facility] Pager #: [phone] 7PM-7AM contact night coverage as above

## 2019-03-19 ENCOUNTER — Encounter (HOSPITAL_COMMUNITY): Payer: Self-pay | Admitting: Nephrology

## 2019-03-19 DIAGNOSIS — I509 Heart failure, unspecified: Secondary | ICD-10-CM | POA: Diagnosis not present

## 2019-03-19 LAB — CBC WITH DIFFERENTIAL/PLATELET
Abs Immature Granulocytes: 0.02 K/uL (ref 0.00–0.07)
Basophils Absolute: 0 K/uL (ref 0.0–0.1)
Basophils Relative: 1 %
Eosinophils Absolute: 0.4 K/uL (ref 0.0–0.5)
Eosinophils Relative: 5 %
HCT: 33.3 % — ABNORMAL LOW (ref 36.0–46.0)
Hemoglobin: 10.9 g/dL — ABNORMAL LOW (ref 12.0–15.0)
Immature Granulocytes: 0 %
Lymphocytes Relative: 22 %
Lymphs Abs: 1.7 K/uL (ref 0.7–4.0)
MCH: 29.7 pg (ref 26.0–34.0)
MCHC: 32.7 g/dL (ref 30.0–36.0)
MCV: 90.7 fL (ref 80.0–100.0)
Monocytes Absolute: 0.8 K/uL (ref 0.1–1.0)
Monocytes Relative: 11 %
Neutro Abs: 4.5 K/uL (ref 1.7–7.7)
Neutrophils Relative %: 61 %
Platelets: 414 K/uL — ABNORMAL HIGH (ref 150–400)
RBC: 3.67 MIL/uL — ABNORMAL LOW (ref 3.87–5.11)
RDW: 12.4 % (ref 11.5–15.5)
WBC: 7.4 K/uL (ref 4.0–10.5)
nRBC: 0 % (ref 0.0–0.2)

## 2019-03-19 LAB — GLUCOSE, CAPILLARY
Glucose-Capillary: 175 mg/dL — ABNORMAL HIGH (ref 70–99)
Glucose-Capillary: 301 mg/dL — ABNORMAL HIGH (ref 70–99)
Glucose-Capillary: 125 mg/dL — ABNORMAL HIGH (ref 70–99)
Glucose-Capillary: 164 mg/dL — ABNORMAL HIGH (ref 70–99)

## 2019-03-19 LAB — RENAL FUNCTION PANEL
Albumin: 3 g/dL — ABNORMAL LOW (ref 3.5–5.0)
Anion gap: 13 (ref 5–15)
BUN: 33 mg/dL — ABNORMAL HIGH (ref 8–23)
CO2: 23 mmol/L (ref 22–32)
Calcium: 10.1 mg/dL (ref 8.9–10.3)
Chloride: 96 mmol/L — ABNORMAL LOW (ref 98–111)
Creatinine, Ser: 3.28 mg/dL — ABNORMAL HIGH (ref 0.44–1.00)
GFR calc Af Amer: 14 mL/min — ABNORMAL LOW (ref >60)
GFR calc non Af Amer: 12 mL/min — ABNORMAL LOW (ref >60)
Glucose, Bld: 207 mg/dL — ABNORMAL HIGH (ref 70–99)
Phosphorus: 3.5 mg/dL (ref 2.5–4.6)
Potassium: 3.8 mmol/L (ref 3.5–5.1)
Sodium: 132 mmol/L — ABNORMAL LOW (ref 135–145)

## 2019-03-19 LAB — MAGNESIUM: Magnesium: 1.6 mg/dL — ABNORMAL LOW (ref 1.7–2.4)

## 2019-03-19 MED ORDER — FUROSEMIDE 80 MG PO TABS
80.0000 mg | ORAL_TABLET | Freq: Every day | ORAL | Status: DC
  Administered 2019-03-20: 80 mg via ORAL
  Filled 2019-03-19: qty 1

## 2019-03-19 NOTE — Plan of Care (Signed)
  Problem: Activity: Goal: Risk for activity intolerance will decrease Outcome: Progressing   

## 2019-03-19 NOTE — Progress Notes (Signed)
PROGRESS NOTE    Abigail Huang  ZOX:[id number] DOB: [birth year] DOA: 03/17/2019 PCP: Burnard Bunting, MD  Outpatient Specialists:   Brief Narrative:  Abigail Huang is a 85 y.o. female with medical history significant of hypertension, diabetes, chronic kidney disease stage Iv, diabetic neuropathy and chronic venous stasis.  Patient serum creatinine was 1.98 on 11/08/2016.  Patient was recently seen at one of the [facility] facilities with a serum creatinine of 2.84 on 03/12/2019.  The plan was for the patient to follow-up with nephrology team on outpatient basis but patient has been admitted with shortness of breath.  Patient was admitted with 1 week history of shortness of breath, chest pressure and leg swelling.  On presentation to the hospital, serum creatinine was 3.15, cardiac BNP of 554.  Patient is currently being diuresed with improvement in symptoms.  Echocardiogram revealed possible diastolic dysfunction, with normal EF.  Further management will depend on hospital course.  Discussed with patient's daughter.  03/19/2019: Patient seen alongside patient's daughter.  Patient's daughter insists on nephrology input.  Nephrology consulted.  Input from Dr. Roney Jaffe, nephrologist, is highly appreciated.  Dr. Roney Jaffe will arrange outpatient nephrology follow-up with nephrology team.  Patient will be discharged on Lasix 80 mg p.o. twice daily for 5 days, then intended p.o. once daily.  Edema has resolved.  Shortness of breath has resolved.  Patient's chronic kidney disease may have progressed to stage V CKD.  Benefit of time will enable knowledge of acute component to patient's chronic kidney disease if any.  Patient is stable to be discharged medically, however, patient's daughter prefers patient to be discharged tomorrow.  This is to allow time for proper discharge planning and home health needs to be put in place.  No new complaints from the patient.  Renal ultrasound findings  are noted.  Assessment & Plan:   Principal Problem:   Acute CHF (congestive heart failure) (HCC) Active Problems:   Diabetes mellitus (HCC)   CKD (chronic kidney disease), stage IV (HCC)   Dyspnea on exertion   Hyperlipidemia   Hypothyroidism  Possible acute diastolic congestive heart failure versus acute on chronic diastolic congestive heart failure:  -Patient presented with shortness of breath, leg swelling, orthopnea, PND.  proBNP is elevated at 554. -Chest x-ray is negative -Patient has been managed with IV Lasix, but will change to oral. -Echocardiogram result is noted.  EF of 60 to 65%.  Left ventricular diastolic Doppler parameters revealed indeterminate pattern of LV diastolic filling.  RVSP of 35 mmHg was reported. -Symptoms have resolved completely.  Consider follow-up with cardiology team on discharge.  Chest tightness: -Troponin was trended. -No further symptoms reported.  Progression to chronic kidney disease stage IV versus acute kidney injury on CKD 4: -Patient is currently progressed to stage V chronic kidney disease.   -Cannot rule out acute component to patient's advanced chronic kidney disease  -We will avoid nephrotoxic medication -Results of renal ultrasound is noted.   -Nephrology team has been consulted. -No further work-up as per nephrology team.  Patient will follow with nephrology on discharge.    Diabetes mellitus: -On glipizide and Levemir 8 units daily. -Hemoglobin A1c was 6.5%. -Continue sliding scale insulin coverage for now. -Resume home insulin regimen on discharge.  Hyperlipidemia:  -Started on atorvastatin 40 mg once daily. 03/18/2019: Total cholesterol is 156, HDL of 48 and LDL of 84.  Continue atorvastatin.  Hypothyroidism:  -Continue levothyroxine 25 mcg once daily. 03/18/2019: TSH is 3.689.  Hypertension:  -  Blood pressure is reasonably controlled.   -Keep blood pressure below 130/80 mmHg. -Optimize blood pressure medication  regimen on outpatient basis when renal function is more stable.  DVT prophylaxis:  Subcutaneous heparin.  Code Status: Partial code, no chest compressions, no intubation, no shock, yes to IV pressor and BiPAP Family Communication: Daughter  Disposition Plan:  Home tomorrow with home health Consults called:  Nephrology. Subjective: Patient seen. Shortness of breath has resolved No chest pressure. Leg edema has resolved  Objective: Vitals:   03/18/19 1627 03/18/19 2041 03/19/19 0430 03/19/19 0936  BP: (!) 154/65 113/63 (!) 155/70 (!) 139/58  Pulse: 71 63 74 67  Resp: 18 16 16 18   Temp: 98.1 F (36.7 C) 98.4 F (36.9 C) 97.7 F (36.5 C) 97.7 F (36.5 C)  TempSrc: Oral  Oral Oral  SpO2: 98% 97% 95% 94%  Weight:  67.7 kg    Height:        Intake/Output Summary (Last 24 hours) at 03/19/2019 1038 Last data filed at 03/19/2019 0900 Gross per 24 hour  Intake 640 ml  Output 1150 ml  Net -510 ml   Filed Weights   03/18/19 0349 03/18/19 2041  Weight: 67.7 kg 67.7 kg    Examination:  General exam: Appears calm and comfortable. Respiratory system: Clear to auscultation.  Cardiovascular system: S1 & S2 heard Gastrointestinal system: Abdomen is nondistended, soft and nontender. No organomegaly or masses felt. Normal bowel sounds heard. Central nervous system: Awake and alert.  Patient moves all extremities.   Extremities: No leg edema.  Data Reviewed: I have personally reviewed following labs and imaging studies  CBC: Recent Labs  Lab 03/17/19 1148 03/17/19 1558 03/18/19 0401 03/19/19 0352  WBC 7.2 7.0 6.8 7.4  NEUTROABS  --   --   --  4.5  HGB 12.1 11.2* 10.8* 10.9*  HCT 34.1* 34.1* 33.5* 33.3*  MCV 90.2 90.9 91.0 90.7  PLT 406* 370 373 854*   Basic Metabolic Panel: Recent Labs  Lab 03/17/19 1148 03/17/19 1558 03/18/19 0401 03/19/19 0352  NA 132*  --  133* 132*  K 4.4  --  4.0 3.8  CL 98  --  101 96*  CO2 23  --  23 23  GLUCOSE 251*  --  206* 207*  BUN  28*  --  29* 33*  CREATININE 3.22* 3.15* 3.12* 3.28*  CALCIUM 10.4*  --  9.8 10.1  MG  --  1.6*  --  1.6*  PHOS  --  3.7  --  3.5   GFR: Estimated Creatinine Clearance: 11.6 mL/min (A) (by C-G formula based on SCr of 3.28 mg/dL (H)). Liver Function Tests: Recent Labs  Lab 03/18/19 0401 03/19/19 0352  AST 15  --   ALT 11  --   ALKPHOS 53  --   BILITOT 0.6  --   PROT 6.6  --   ALBUMIN 3.1* 3.0*   No results for input(s): LIPASE, AMYLASE in the last 168 hours. No results for input(s): AMMONIA in the last 168 hours. Coagulation Profile: No results for input(s): INR, PROTIME in the last 168 hours. Cardiac Enzymes: No results for input(s): CKTOTAL, CKMB, CKMBINDEX, TROPONINI in the last 168 hours. BNP (last 3 results) No results for input(s): PROBNP in the last 8760 hours. HbA1C: Recent Labs    03/17/19 1558  HGBA1C 6.5*   CBG: Recent Labs  Lab 03/18/19 0658 03/18/19 1206 03/18/19 1627 03/18/19 2042 03/19/19 0700  GLUCAP 162* 172* 332* 110* 175*  Lipid Profile: Recent Labs    03/17/19 1558  CHOL 156  HDL 48  LDLCALC 84  TRIG 121  CHOLHDL 3.3   Thyroid Function Tests: Recent Labs    03/17/19 1558  TSH 3.689   Anemia Panel: No results for input(s): VITAMINB12, FOLATE, FERRITIN, TIBC, IRON, RETICCTPCT in the last 72 hours. Urine analysis:    Component Value Date/Time   COLORURINE COLORLESS (A) 03/17/2019 1915   APPEARANCEUR CLEAR 03/17/2019 1915   LABSPEC 1.005 03/17/2019 1915   PHURINE 8.0 03/17/2019 1915   GLUCOSEU 50 (A) 03/17/2019 1915   HGBUR NEGATIVE 03/17/2019 [place] 03/17/2019 [place] 03/17/2019 1915   PROTEINUR 100 (A) 03/17/2019 1915   UROBILINOGEN 0.2 03/08/2012 1613   NITRITE NEGATIVE 03/17/2019 1915   LEUKOCYTESUR NEGATIVE 03/17/2019 1915   Sepsis Labs: @LABRCNTIP (procalcitonin:4,lacticidven:4)  ) Recent Results (from the past 240 hour(s))  SARS Coronavirus 2 [facility] order, Performed in  [facility] hospital lab) Nasopharyngeal Nasopharyngeal Swab     Status: None   Collection Time: 03/17/19  2:25 PM   Specimen: Nasopharyngeal Swab  Result Value Ref Range Status   SARS Coronavirus 2 NEGATIVE NEGATIVE Final    Comment: (NOTE) If result is NEGATIVE SARS-CoV-2 target nucleic acids are NOT DETECTED. The SARS-CoV-2 RNA is generally detectable in upper and lower  respiratory specimens during the acute phase of infection. The lowest  concentration of SARS-CoV-2 viral copies this assay can detect is 250  copies / mL. A negative result does not preclude SARS-CoV-2 infection  and should not be used as the sole basis for treatment or other  patient management decisions.  A negative result may occur with  improper specimen collection / handling, submission of specimen other  than nasopharyngeal swab, presence of viral mutation(s) within the  areas targeted by this assay, and inadequate number of viral copies  (<250 copies / mL). A negative result must be combined with clinical  observations, patient history, and epidemiological information. If result is POSITIVE SARS-CoV-2 target nucleic acids are DETECTED. The SARS-CoV-2 RNA is generally detectable in upper and lower  respiratory specimens dur ing the acute phase of infection.  Positive  results are indicative of active infection with SARS-CoV-2.  Clinical  correlation with patient history and other diagnostic information is  necessary to determine patient infection status.  Positive results do  not rule out bacterial infection or co-infection with other viruses. If result is PRESUMPTIVE POSTIVE SARS-CoV-2 nucleic acids MAY BE PRESENT.   A presumptive positive result was obtained on the submitted specimen  and confirmed on repeat testing.  While 2019 novel coronavirus  (SARS-CoV-2) nucleic acids may be present in the submitted sample  additional confirmatory testing may be necessary for epidemiological  and / or clinical  management purposes  to differentiate between  SARS-CoV-2 and other Sarbecovirus currently known to infect humans.  If clinically indicated additional testing with an alternate test  methodology [phone]) is advised. The SARS-CoV-2 RNA is generally  detectable in upper and lower respiratory sp ecimens during the acute  phase of infection. The expected result is Negative. Fact Sheet for Patients:  StrictlyIdeas.no Fact Sheet for Healthcare Providers: BankingDealers.co.za This test is not yet approved or cleared by the [place] FDA and has been authorized for detection and/or diagnosis of SARS-CoV-2 by FDA under an Emergency Use Authorization (EUA).  This EUA will remain in effect (meaning this test can be used) for the duration of the COVID-19 declaration under Section 564(b)(1)  of the Act, 21 U.S.C. section 360bbb-3(b)(1), unless the authorization is terminated or revoked sooner. Performed at [facility], [place] [address], [place], [place] [address]          Radiology Studies: Dg Chest 2 View  Result Date: 03/17/2019 CLINICAL DATA:  Shortness of breath, fullness, chest pain. EXAM: CHEST - 2 VIEW COMPARISON:  03/12/2019 and 11/04/2016 FINDINGS: No signs of consolidation or pleural effusion. Linear opacity in left lung base may represent scarring or atelectasis. Heart size is stable, mildly enlarged with atherosclerotic changes in the aorta. No acute bone process. IMPRESSION: No active cardiopulmonary disease. Electronically Signed   By: Zetta Bills M.D.   On: 03/17/2019 12:10   US Renal  Result Date: 03/17/2019 CLINICAL DATA:  Worsening renal failure EXAM: RENAL / URINARY TRACT ULTRASOUND COMPLETE COMPARISON:  CT 12/29/2010 FINDINGS: Right Kidney: Renal measurements: 10.2 x 4.8 x 4.8 cm = volume: 123 mL. Mild hydronephrosis. Increased renal cortical echogenicity. No mass visualized. Left Kidney: Renal measurements: 7.6 x 3.8  x 3.2 cm = volume: 48 mL. Diffuse renal cortical atrophy with increased echogenicity. Trace left perinephric fluid no mass or hydronephrosis visualized. Bladder: Appears normal for degree of bladder distention. IMPRESSION: 1. Mild right hydronephrosis. 2. The left kidney is atrophic relative to the right. 3. Trace left perinephric fluid.  No left-sided hydronephrosis. 4. Increased bilateral renal parenchymal echogenicity suggesting chronic medical renal disease. Electronically Signed   By: Davina Poke M.D.   On: 03/17/2019 16:39        Scheduled Meds:  amLODipine  10 mg Oral Daily   atenolol  50 mg Oral Daily   atorvastatin  40 mg Oral q1800   furosemide  80 mg Intravenous BID   heparin injection (subcutaneous)  5,000 Units Subcutaneous Q8H   insulin aspart  0-15 Units Subcutaneous TID WC   insulin aspart  0-5 Units Subcutaneous QHS   insulin glargine  10 Units Subcutaneous Daily   levothyroxine  25 mcg Oral Q0600   Continuous Infusions:   LOS: 2 days    Time spent: 25 minutes    Dana Allan, MD  [facility] Pager #: [phone] 7PM-7AM contact night coverage as above

## 2019-03-19 NOTE — TOC Initial Note (Addendum)
Transition of Care [facility]) - Initial/Assessment Note    Patient Details  Name: Abigail Huang MRN: [id number] Date of Birth: 1930-09-03  Transition of Care [facility] CM/SW Contact:    Carles Collet, RN Phone Number: 03/19/2019, 1:59 PM  Clinical Narrative:                Spoke with patient and daughter at bedside. Patient will return to home with spouse at DC. Daughter from [place] will be staying until Wednesday. Patient has two other supportive children in the area, but according to daughter at bedside they are both preoccupied with health and family issues. Both patient and daughter agree that family works well together.  Patient has oversized rollator from a friend. Would like a more appropriate easier to manage one, order placed and requested to be delivered to room by [employer]. Patient would need PT OT RN at DC. Does not want HHA.  Daughter is ordering tub bench from [employer].   HH accepted by [facility]   Expected Discharge Plan: [place] Barriers to Discharge: Continued Medical Work up   Patient Goals and CMS Choice Patient states their goals for this hospitalization and ongoing recovery are:: to go home CMS Medicare.gov Compare Post Acute Care list provided to:: Patient Choice offered to / list presented to : Patient, Adult Children  Expected Discharge Plan and Services Expected Discharge Plan: [place]   Discharge Planning Services: CM Consult Post Acute Care Choice: Durable Medical Equipment, Home Health Living arrangements for the past 2 months: Single Family Home                 DME Arranged: Walker rolling with seat DME Agency: [employer] Date DME Agency Contacted: 03/19/19 Time DME Agency Contacted: [phone] Representative spoke with at DME Agency: [place] Arranged: RN, PT, OT Date [place] Agency Contacted: 03/19/19 Time [place]: [id number]   Prior Living Arrangements/Services Living arrangements for the past 2 months: [place] with:: Spouse Patient language and need for interpreter reviewed:: Yes Do you feel safe going back to the place where you live?: Yes          Current home services: DME Criminal Activity/Legal Involvement Pertinent to Current Situation/Hospitalization: No - Comment as needed  Activities of Daily Living Home Assistive Devices/Equipment: CBG Meter, Cane (specify quad or straight), Walker (specify type) ADL Screening (condition at time of admission) Patient's cognitive ability adequate to safely complete daily activities?: Yes Is the patient deaf or have difficulty hearing?: No Does the patient have difficulty seeing, even when wearing glasses/contacts?: No Does the patient have difficulty concentrating, remembering, or making decisions?: No Patient able to express need for assistance with ADLs?: Yes Does the patient have difficulty dressing or bathing?: No Independently performs ADLs?: Yes (appropriate for developmental age) Does the patient have difficulty walking or climbing stairs?: No Weakness of Legs: Both Weakness of Arms/Hands: None  Permission Sought/Granted                  Emotional Assessment              Admission diagnosis:  Acute on chronic diastolic congestive heart failure ([place]) [I50.33] Worsening renal function [N28.9] Patient Active Problem List   Diagnosis Date Noted  . Acute on chronic renal failure ([place]) 03/17/2019  . Hypothyroidism 03/17/2019  . Acute CHF (congestive heart failure) ([place]) 03/17/2019  . Essential hypertension 01/06/2017  . Dyspnea on exertion 01/06/2017  . Hyperlipidemia 01/06/2017  .  Closed displaced fracture of right femoral neck ([place]) 11/03/2016  . Diabetes mellitus ([place]) 11/03/2016  . Hyponatremia 11/03/2016  . Leukocytosis 11/03/2016  . Normocytic anemia 11/03/2016  . CKD (chronic kidney disease), stage IV ([place]) 11/03/2016   PCP:  Burnard Bunting, MD Pharmacy:   [facility], [place] [address] [address] Phone: [phone] Fax: [place], [place] [address] [address] Phone: [phone] Fax: [phone]     Social Determinants of Health (SDOH) Interventions    Readmission Risk Interventions No flowsheet data found.

## 2019-03-19 NOTE — Evaluation (Signed)
Physical Therapy Evaluation Patient Details Name: Abigail Huang MRN: [id number] DOB: 1931/01/31 Today's Date: 03/19/2019   History of Present Illness  Abigail Huang is a 85 y.o. female with medical history significant of hypertension, diabetes, chronic kidney disease stage IIIb, diabetic neuropathy, chronic venous stasis presents to emergency department due to worsening shortness of breath on exertion and chest tightness since 1 week.  Associated with leg swelling, orthopnea  Clinical Impression   Pt admitted with above diagnosis. Tells me she walks without assistive device in the house and uses a rollator RW when she goes out; Presents to PT with unsteadiness on feet, fall risk; Pt currently with functional limitations due to the deficits listed below (see PT Problem List). Pt will benefit from skilled PT to increase their independence and safety with mobility to allow discharge to the venue listed below.    Pt's daughter, Abigail Huang, voiced concern about Abigail Huang (and husband)'s ability to manage at home; How much assist is available to the pt and her husband through [place]?  I suggested the family contact Abigail Huang's husband's PCP to see about getting him more assist in the home and thereby lessening Abigail Huang's workload.     Follow Up Recommendations Home health PT;Supervision/Assistance - 24 hour;Other (comment)(HHRN for chronic disease management; HHSW for follow up and support for pt/husband/family as it is likely time to consider ALF options)    Equipment Recommendations  None recommended by PT    Recommendations for Other Services Other (comment)(Dietician)     Precautions / Restrictions Precautions Precautions: Fall Precaution Comments: fall risk greatly reduced with use of RW      Mobility  Bed Mobility                  Transfers Overall transfer level: Needs assistance Equipment used: None Transfers: Sit to/from Stand Sit to Stand:  Supervision         General transfer comment: No difficulty with rise to stand  Ambulation/Gait Ambulation/Gait assistance: Min guard(without physical contact) Gait Distance (Feet): 150 Feet Assistive device: None;Rolling walker (2 wheeled) Gait Pattern/deviations: Step-through pattern;Decreased step length - right;Decreased step length - left     General Gait Details: Initially walked without assistive device, and noted tendency to reach out for UE support; Then walked in hallway with more steadiness; cues to self-monitor for activity tolerance; HR 78, O2 sats 98%, no shortness of breath  Stairs            Wheelchair Mobility    Modified Rankin (Stroke Patients Only)       Balance                                             Pertinent Vitals/Pain Pain Assessment: No/denies pain    Home Living Family/patient expects to be discharged to:: Private residence Living Arrangements: Spouse/significant other;Other (Comment)(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) Available Help at Discharge: (Per her daughter, her adult children are unable to be more present in pt and pt's husband's lives) Type of Home: House Home Access: Stairs to enter Entrance Stairs-Rails: Left Entrance Stairs-Number of Steps: 2 Home Layout: One level Home Equipment: [place] - 4 wheels;Cane - single point;Bedside commode      Prior Function Level of Independence: Independent with assistive device(s)         Comments: [employer] furniture at home; uses [employer]  RW when out     Hand Dominance        Extremity/Trunk Assessment   Upper Extremity Assessment Upper Extremity Assessment: Generalized weakness    Lower Extremity Assessment Lower Extremity Assessment: Generalized weakness       Communication   Communication: No difficulties  Cognition Arousal/Alertness: Awake/alert Behavior During Therapy: WFL for tasks assessed/performed Overall Cognitive Status: Within  Functional Limits for tasks assessed(for simple mobility)                                        General Comments General comments (skin integrity, edema, etc.): Daughter, Abigail Huang, voiced concern about how kpatient and pt's husband are managing at home.    Exercises     Assessment/Plan    PT Assessment Patient needs continued PT services  PT Problem List Decreased strength;Decreased activity tolerance;Decreased balance;Decreased mobility;Decreased knowledge of use of DME;Decreased safety awareness;Decreased knowledge of precautions;Cardiopulmonary status limiting activity       PT Treatment Interventions DME instruction;Gait training;Stair training;Functional mobility training;Therapeutic activities;Therapeutic exercise;Balance training;Patient/family education    PT Goals (Current goals can be found in the Care Plan section)  Acute Rehab PT Goals Patient Stated Goal: Did not specifically state; agreeable to walk PT Goal Formulation: With patient Time For Goal Achievement: 04/02/19 Potential to Achieve Goals: Good    Frequency Min 3X/week   Barriers to discharge Decreased caregiver support Pt is caregiver for her husband; Daughter is concerned about how they are managing in the home; Abigail Huang adult children are dealing with a lot in their own families, and are unable to be very present for Abigail Huang and her husband (with dementia) at this time; Concern for Abigail Huang's ability to be able to get to her Outpt MD follow-up appointments    Co-evaluation               AM-PAC PT "6 Clicks" Mobility  Outcome Measure Help needed turning from your back to your side while in a flat bed without using bedrails?: None Help needed moving from lying on your back to sitting on the side of a flat bed without using bedrails?: None Help needed moving to and from a bed to a chair (including a wheelchair)?: A Little Help needed standing up from a chair using your arms  (e.g., wheelchair or bedside chair)?: A Little Help needed to walk in hospital room?: A Little Help needed climbing 3-5 steps with a railing? : A Little 6 Click Score: 20    End of Session Equipment Utilized During Treatment: Gait belt Activity Tolerance: Patient tolerated treatment well Patient left: in chair;with call bell/phone within reach;with chair alarm set;Other (comment)(Eating lunch) Nurse Communication: Mobility status;Other (comment)(Consider putting in a dietician consult for renal diet) PT Visit Diagnosis: Unsteadiness on feet (R26.81)    Time: 5465-0354 PT Time Calculation (min) (ACUTE ONLY): 32 min   Charges:   PT Evaluation $PT Eval Moderate Complexity: 1 Mod PT Treatments $Gait Training: 8-22 mins        Roney Marion, PT  Acute Rehabilitation Services Pager [phone] [place] 03/19/2019, 12:49 PM

## 2019-03-19 NOTE — Consult Note (Addendum)
Renal Service Consult Note [facility]  Morganna Styles 03/19/2019 [place] Requesting Physician:  Dr Marcie Bal  Reason for Consult:  Progression of CKD HPI: The patient is a 85 y.o. year-old with hx of HTN, HL, CKD and DM2 presented to ED 10/2 for SOB and CP x 2-3 weeks.  SOB worse lying flat. Was in [facility] ED last week and rec'd IV lasix for similar problems. CXR here 10/2 was negative.  Pt admitted for CHF.  Creat here is 3.1- 3.3. Asked to see for renal failure.   Patient denies any severe fatigue, loss of appetite, refractory nausea, anorexia of wt loss.  Appetite is fine.  Has had some ankle swelling and SOB, better now.  No voiding issues.   ROS  denies CP  no joint pain   no HA  no blurry vision  no rash  no dysuria  no difficulty voiding  no change in urine color    Past Medical History  Past Medical History:  Diagnosis Date  . Diabetes mellitus   . Hyperlipidemia   . Hypertension    Past Surgical History  Past Surgical History:  Procedure Laterality Date  . ABDOMINAL HYSTERECTOMY    . ANTERIOR APPROACH HEMI HIP ARTHROPLASTY Right 11/04/2016   Procedure: ANTERIOR APPROACH HEMI HIP ARTHROPLASTY;  Surgeon: Dorna Leitz, MD;  Location: [place];  Service: Orthopedics;  Laterality: Right;  . COLONOSCOPY    . POLYPECTOMY     Family History  Family History  Problem Relation Age of Onset  . Heart disease Mother   . Heart disease Father   . Heart disease Sister    Social History  reports that she quit smoking about 51 years ago. She has never used smokeless tobacco. She reports that she does not drink alcohol or use drugs. Allergies No Known Allergies Home medications Prior to Admission medications   Medication Sig Start Date End Date Taking? Authorizing Provider  amLODipine (NORVASC) 10 MG tablet Take 10 mg by mouth daily.   Yes [provider]  atenolol (TENORMIN) 50 MG tablet Take 50 mg by mouth daily.    Yes [provider]  Calcium Carbonate-Vitamin D (CALTRATE 600+D PO) Take 1 tablet by mouth daily.    Yes [provider]  furosemide (LASIX) 20 MG tablet Take 1 tablet (20 mg total) by mouth daily. 03/13/19  Yes Mesner, Corene Cornea, MD  glipiZIDE (GLUCOTROL XL) 10 MG 24 hr tablet Take 10 mg by mouth daily with breakfast.   Yes [provider]  insulin detemir (LEVEMIR) 100 UNIT/ML injection Inject 8 Units into the skin daily.    Yes [provider]  levothyroxine (SYNTHROID) 25 MCG tablet  02/13/19  Yes [provider]  Multiple Vitamins-Minerals (CENTRUM SILVER PO) Take 1 tablet by mouth daily.   Yes [provider]  simvastatin (ZOCOR) 20 MG tablet Take 20 mg by mouth daily.   Yes [provider]   Liver Function Tests Recent Labs  Lab 03/18/19 0401 03/19/19 0352  AST 15  --   ALT 11  --   ALKPHOS 53  --   BILITOT 0.6  --   PROT 6.6  --   ALBUMIN 3.1* 3.0*   No results for input(s): LIPASE, AMYLASE in the last 168 hours. CBC Recent Labs  Lab 03/17/19 1558 03/18/19 0401 03/19/19 0352  WBC 7.0 6.8 7.4  NEUTROABS  --   --  4.5  HGB 11.2* 10.8* 10.9*  HCT 34.1* 33.5* 33.3*  MCV 90.9 91.0 90.7  PLT 370 373 557*   Basic Metabolic Panel Recent Labs  Lab 03/17/19 1148 03/17/19 1558 03/18/19 0401 03/19/19 0352  NA 132*  --  133* 132*  K 4.4  --  4.0 3.8  CL 98  --  101 96*  CO2 23  --  23 23  GLUCOSE 251*  --  206* 207*  BUN 28*  --  29* 33*  CREATININE 3.22* 3.15* 3.12* 3.28*  CALCIUM 10.4*  --  9.8 10.1  PHOS  --  3.7  --  3.5   Iron/TIBC/Ferritin/ %Sat    Component Value Date/Time   IRON 33 11/04/2016 0138   TIBC 329 11/04/2016 0138   FERRITIN 53 11/04/2016 0138   IRONPCTSAT 10 (L) 11/04/2016 0138    Vitals:   03/18/19 1627 03/18/19 2041 03/19/19 0430 03/19/19 0936  BP: (!) 154/65 113/63 (!) 155/70 (!) 139/58  Pulse: 71 63 74 67  Resp: _0 Temp: 98.1 F (36.7 C) 98.4 F (36.9 C) 97.7 F (36.5 C) 97.7 F (36.5 C)   TempSrc: Oral  Oral Oral  SpO2: 98% 97% 95% 94%  Weight:  67.7 kg    Height:        Exam Gen alert, up in chair No rash, cyanosis or gangrene Sclera anicteric, throat clear  No jvd or bruits Chest clear bilat to bases, no rales or wheezing RRR no MRG Abd soft ntnd no mass or ascites +bs GU defer MS no joint effusions or deformity Ext 1-2+ pitting bilat pretib edema, no wounds or ulcers Neuro is alert, Ox 3 , nf    Home meds:  - amlodipine 10 / atenolol 50 / furosemide 20 qd  - simvastatin 20 qd  - glipizide xl 10 am/ insulin detemir 8 u qd  - levothyroxine 25ug qd   UA 10/2 > clear, 100 prot, 8.0, 0-5 rbc/ epi's  Renal US 03/17/19 >  IMPRESSION: 1. Mild right hydronephrosis, 10.2 cm 2. The left kidney is atrophic relative to the right, 7.6 cm 3. Trace left perinephric fluid.  No left-sided hydronephrosis. 4. Increased bilateral renal parenchymal echogenicity suggesting chronic medical renal disease.    Date  Creat  eGFR   2013  1.58  30    2015  1.54    2018  1.80- 2.02 22- 25   Sept 2020 2.7- 2.8 30 Mar 2019  3.1- 3.3 12- 13   Assessment/ Plan: 1. Renal failure - appears progressive over the last 5- 10 years.  Has one atrophic kidney on [place].  eGFR is low but no uremic symptoms or signs noted. No indication for RRT at this time. Suspect HTN'sive nephrosclerosis +/- renovasc disease. Talked w/ pt and daughter at length about CKD, dialysis, risks and benefits, importance of fluid restriction and who to call for assistance after dc.  Will need close renal f/u, our office will contact pt/ daughter Stasia Cavalier) this week to set up follow-up appt at [facility].  No further suggestions. Will sign off.  2. Vol overload - w/ SOB, LE edeam, neg CXR. Diuresed some here.  Was on lasix 20 at home, on 80 qd here. Still has some LE edema, would rec 80 po bid at dc x 5 days then 80 po daily.  3. HTN - cont current BP regimen 4. DM - on insulin      Kelly Splinter  MD 03/19/2019, 10:35  AM

## 2019-03-19 NOTE — Evaluation (Signed)
Occupational Therapy Evaluation Patient Details Name: Abigail Huang MRN: [id number] DOB: 03/29/1931 Today's Date: 03/19/2019    History of Present Illness Abigail Huang is a 85 y.o. female with medical history significant of hypertension, diabetes, chronic kidney disease stage IIIb, diabetic neuropathy, chronic venous stasis presents to emergency department due to worsening shortness of breath on exertion and chest tightness since 1 week.  Associated with leg swelling, orthopnea   Clinical Impression   PTA patient reports independent with ADLs, mobility (using rollator in commuinty) and IADLs.  Patient admitted for above and limited by problem list below, including impaired balance, decreased activity tolerance and generalized weakness. She currently requires min guard to supervision for transfers, min guard for in room mobility, and min guard for LB ADLs.  She was educated on safety and fall prevention with ADLs, daughter purchased tub bench for increased shower safety.  She will benefit from continued OT services while admitted and after dc at [facility] [place] [facility] level in order to maximize independence, safety and tolerance to self care and daily activities.     Follow Up Recommendations  Home health OT    Equipment Recommendations  3 in 1 bedside commode    Recommendations for Other Services       Precautions / Restrictions Precautions Precautions: Fall Precaution Comments: fall risk greatly reduced with use of RW Restrictions Weight Bearing Restrictions: No      Mobility Bed Mobility               General bed mobility comments: OOB in recliner upon entry   Transfers Overall transfer level: Needs assistance Equipment used: None Transfers: Sit to/from Stand Sit to Stand: Supervision;Min guard         General transfer comment: supervision from recliner, min guard from commode to steady    Balance Overall balance assessment: Needs assistance Sitting-balance  support: No upper extremity supported;Feet supported Sitting balance-Leahy Scale: Good     Standing balance support: No upper extremity supported;During functional activity Standing balance-Leahy Scale: Fair Standing balance comment: pt reaching out for UE support when it is available, min guard dynamically                           ADL either performed or assessed with clinical judgement   ADL Overall ADL's : Needs assistance/impaired     Grooming: Min guard;Standing   Upper Body Bathing: Set up;Sitting   Lower Body Bathing: Min guard;Sit to/from stand   Upper Body Dressing : Sitting;Supervision/safety;Set up   Lower Body Dressing: Min guard;Sit to/from stand   Toilet Transfer: Min guard;Ambulation   Toileting- Clothing Manipulation and Hygiene: Min guard;Sit to/from stand       Functional mobility during ADLs: Min guard General ADL Comments: pt limited by impaired balance, generalized weakness     Vision   Vision Assessment?: No apparent visual deficits     Perception     Praxis      Pertinent Vitals/Pain Pain Assessment: No/denies pain     Hand Dominance Right   Extremity/Trunk Assessment Upper Extremity Assessment Upper Extremity Assessment: Generalized weakness   Lower Extremity Assessment Lower Extremity Assessment: Defer to PT evaluation       Communication Communication Communication: No difficulties   Cognition Arousal/Alertness: Awake/alert Behavior During Therapy: WFL for tasks assessed/performed Overall Cognitive Status: Within Functional Limits for tasks assessed  General Comments  daughter present    Exercises     Shoulder Instructions      Home Living Family/patient expects to be discharged to:: Private residence Living Arrangements: Spouse/significant other;Other (Comment) Available Help at Discharge: Family Type of Home: House Home Access: Stairs to  enter [employer] of Steps: 2 Entrance Stairs-Rails: Left Home Layout: One level     Bathroom Shower/Tub: Teacher, early years/pre: Handicapped height     Home Equipment: Environmental consultant - 4 wheels;Cane - single point;Bedside commode;Grab bars - tub/shower;Grab bars - toilet;Tub bench   Additional Comments: daughter purchased tub bench from [employer]      Prior Functioning/Environment Level of Independence: Independent with assistive device(s)        Comments: [employer] furniture at home; uses [employer] when out; independent ADLs, IADLs         OT Problem List: Decreased strength;Decreased activity tolerance;Impaired balance (sitting and/or standing);Decreased knowledge of precautions;Decreased knowledge of use of DME or AE      OT Treatment/Interventions: Self-care/ADL training;Therapeutic exercise;DME and/or AE instruction;Therapeutic activities;Balance training;Patient/family education    OT Goals(Current goals can be found in the care plan section) Acute Rehab OT Goals Patient Stated Goal: to get home soon OT Goal Formulation: With patient Time For Goal Achievement: 04/02/19 Potential to Achieve Goals: Good  OT Frequency: Min 2X/week   Barriers to D/C:            Co-evaluation              AM-PAC OT "6 Clicks" Daily Activity     Outcome Measure Help from another person eating meals?: None Help from another person taking care of personal grooming?: A Little Help from another person toileting, which includes using toliet, bedpan, or urinal?: A Little Help from another person bathing (including washing, rinsing, drying)?: A Little Help from another person to put on and taking off regular upper body clothing?: None Help from another person to put on and taking off regular lower body clothing?: A Little 6 Click Score: 20   End of Session    Activity Tolerance: Patient tolerated treatment well Patient left: in chair;with call bell/phone within  reach;with chair alarm set  OT Visit Diagnosis: Muscle weakness (generalized) (M62.81);Unsteadiness on feet (R26.81)                Time: 0277-4128 OT Time Calculation (min): 26 min Charges:  OT General Charges $OT Visit: 1 Visit OT Evaluation $OT Eval Moderate Complexity: 1 Mod OT Treatments $Self Care/Home Management : 8-22 mins  Delight Stare, OT Acute Rehabilitation Services Pager [phone] Office [phone]   Delight Stare 03/19/2019, 2:56 PM

## 2019-03-20 LAB — RENAL FUNCTION PANEL
Albumin: 2.8 g/dL — ABNORMAL LOW (ref 3.5–5.0)
Anion gap: 13 (ref 5–15)
BUN: 38 mg/dL — ABNORMAL HIGH (ref 8–23)
CO2: 24 mmol/L (ref 22–32)
Calcium: 9.9 mg/dL (ref 8.9–10.3)
Chloride: 97 mmol/L — ABNORMAL LOW (ref 98–111)
Creatinine, Ser: 3.37 mg/dL — ABNORMAL HIGH (ref 0.44–1.00)
GFR calc Af Amer: 14 mL/min — ABNORMAL LOW (ref >60)
GFR calc non Af Amer: 12 mL/min — ABNORMAL LOW (ref >60)
Glucose, Bld: 151 mg/dL — ABNORMAL HIGH (ref 70–99)
Phosphorus: 3.7 mg/dL (ref 2.5–4.6)
Potassium: 3.9 mmol/L (ref 3.5–5.1)
Sodium: 134 mmol/L — ABNORMAL LOW (ref 135–145)

## 2019-03-20 LAB — GLUCOSE, CAPILLARY
Glucose-Capillary: 138 mg/dL — ABNORMAL HIGH (ref 70–99)
Glucose-Capillary: 281 mg/dL — ABNORMAL HIGH (ref 70–99)

## 2019-03-20 MED ORDER — FUROSEMIDE 80 MG PO TABS: 80.0000 mg | ORAL_TABLET | Freq: Every day | ORAL | 0 refills | Status: DC

## 2019-03-20 MED ORDER — POTASSIUM CHLORIDE ER 20 MEQ PO TBCR: 20.0000 meq | EXTENDED_RELEASE_TABLET | Freq: Every day | ORAL | 0 refills | Status: DC

## 2019-03-20 NOTE — Discharge Instructions (Signed)
Low sodium, Consistent Carbohydrate Nutrition Therapy   Consume a consistent amount of carbohydrate  It is important to eat foods with carbohydrates in moderation because they impact your blood glucose level. Carbohydrates can be found in many foods such as:  Grains (breads, crackers, rice, pasta, and cereals)   Starchy Vegetables (potatoes, corn, and peas)   Beans and legumes   Milk, soy milk, and yogurt   Fruit and fruit juice   Sweets (cakes, cookies, ice cream, jam and jelly)  Your RDN will help you set a goal for how many carbohydrate servings to eat at your meals and snacks. For many adults, eating 3 to 5 servings of carbohydrate foods at each meal and 1 or 2 carbohydrate servings for each snack works well.   Check your blood glucose level regularly. It can tell you if you need to adjust when you eat carbohydrates.   Choose foods rich in viscous (soluble) fiber  Viscous, or soluble, is found in the walls of plant cells. Viscous fiber is found only in plant-based foods. Eating foods with fiber helps to lower your unhealthy cholesterol and keep your blood glucose in range   Rich sources of viscous fiber include vegetables (asparagus, Brussels sprouts, sweet potatoes, turnips) fruit (apricots, mangoes, oranges), legumes, and whole grains (barley, oats, and oat bran).   As you increase your fiber intake gradually, also increase the amount of water you drink. This will help prevent constipation.   If you have difficulty achieving this goal, ask your RDN about fiber laxatives. Choose fiber supplements made with viscous fibers such as psyllium seed husks or methylcellulose to help lower unhealthy cholesterol.   Limit refined carbohydrates   There are three types of carbohydrates: starches, sugar, and fiber. Some carbohydrates occur naturally in food, like the starches in rice or corn or the sugars in fruits and milk. Refined carbohydrates--foods with high amounts of simple  sugars--can raise triglyceride levels. High triglyceride levels are associated with coronary heart disease.  Some examples of refined carbohydrate foods are table sugar, sweets, and beverages sweetened with added sugar.  Tips for Reducing Sodium (Salt) Although sodium is important for your body to function, too much sodium can be harmful for people with high blood pressure. As sodium and fluid buildup in your tissues and bloodstream, your blood pressure increases. High blood pressure may cause damage to other organs and increase your risk for a stroke. Even if you take a pill for blood pressure or a water pill (diuretic) to remove fluid, it is still important to have less salt in your diet. Ask your doctor and RDN what amount of sodium is right for you.  Avoid processed foods. Eat more fresh foods.   Fresh fruits and vegetables are naturally low in sodium, as well as frozen vegetables and fruits that have no added juices or sauces.   Fresh meats are lower in sodium than processed meats, such as bacon, sausage, and hotdogs. Read the nutrition label or ask your butcher to help you find a fresh meat that is low in sodium.  Eat less salt--at the table and when cooking.   A single teaspoon of table salt has 2,300 mg of sodium.   Leave the salt out of recipes for pasta, casseroles, and soups.   Ask your RDN how to cook your favorite recipes without sodium  Be a smart shopper.   Look for food packages that say salt-free or sodium-free. These items contain less than 5 milligrams of sodium per serving.  Very low-sodium products contain less than 35 milligrams of sodium per serving.   Low-sodium products contain less than 140 milligrams of sodium per serving.   Beware for Unsalted or No Added Salt products. These items may still be high in sodium. Check the nutrition label.  Add flavors to your food without adding sodium.   Try lemon juice, lime juice, fruit juice or vinegar.    Dry or fresh herbs add flavor. Try basil, bay leaf, dill, rosemary, parsley, sage, dry mustard, nutmeg, thyme, and paprika.   Pepper, red pepper flakes, and cayenne pepper can add spice t your meals without adding sodium. Hot sauce contains sodium, but if you use just a drop or two, it will not add up to much.   Buy a sodium-free seasoning blend or make your own at home.  Foods Recommended Food Group Foods Recommended  Grains Whole grain breads and cereals, including whole wheat, barley, rye, buckwheat, corn, teff, quinoa, millet, amaranth, brown or wild rice, sorghum, and oats Pasta, especially whole wheat or other whole grain types  [employer], quinoa or wild rice Whole grain crackers, bread, rolls, pitas Home-made bread with reduced-sodium baking soda  Protein Foods Lean cuts of beef and pork (loin, leg, round, extra lean hamburger)  Skinless Cytogeneticist and other wild game Dried beans and peas Nuts and nut butters Meat alternatives made with soy or textured vegetable protein  Egg whites or egg substitute Cold cuts made with lean meat or soy protein  Dairy Nonfat (skim), low-fat, or 1%-fat milk  Nonfat or low-fat yogurt or cottage cheese Fat-free and low-fat cheese  Vegetables Fresh, frozen, or canned vegetables without added fat or salt   Fruits Fresh, frozen, canned, or dried fruit   Oils Unsaturated oils (corn, olive, peanut, soy, sunflower, canola)  Soft or liquid margarines and vegetable oil spreads  Salad dressings Seeds and nuts  Avocado   Foods Not Recommended Food Group Foods Not Recommended  Grains Breads or crackers topped with salt Cereals (hot or cold) with more than 300 mg sodium per serving Biscuits, cornbread, and other quick breads prepared with baking soda Bread crumbs or stuffing mix from a store High-fat bakery products, such as doughnuts, biscuits, croissants, danish pastries, pies, cookies Instant cooking foods to which you add hot water  and stir--potatoes, noodles, rice, etc. Packaged starchy foods--seasoned noodle or rice dishes, stuffing mix, macaroni and cheese dinner Snacks made with partially hydrogenated oils, including chips, cheese puffs, snack mixes, regular crackers, butter-flavored popcorn  Protein Foods Higher-fat cuts of meats (ribs, t-bone steak, regular hamburger) Bacon, sausage, or hot dogs Cold cuts, such as salami or bologna, deli meats, cured meats, corned beef Organ meats (liver, brains, gizzards, sweetbreads) Poultry with skin Fried or smoked meat, poultry, and fish Whole eggs and egg yolks (more than 2-4 per week) Salted legumes, nuts, seeds, or nut/seed butters Meat alternatives with high levels of sodium (>300 mg per serving) or saturated fat (>5 g per serving)  Dairy Whole milk,?2% fat milk, buttermilk Whole milk yogurt or ice cream Cream Half-&-half Cream cheese Sour cream Cheese  Vegetables Canned or frozen vegetables with salt, fresh vegetables prepared with salt, butter, cheese, or cream sauce Fried vegetables Pickled vegetables such as olives, pickles, or sauerkraut  Fruits Fried fruits Fruits served with butter or cream  Oils Butter, stick margarine, shortening Partially hydrogenated oils or trans fats Tropical oils (coconut, palm, palm kernel oils)  Other Candy, sugar sweetened soft drinks and desserts Salt, sea salt,  garlic salt, and seasoning mixes containing salt Bouillon cubes Ketchup, barbecue sauce, Worcestershire sauce, soy sauce, teriyaki sauce Miso Salsa Pickles, olives, relish   Low sodium- Consistent Carbohydrate Vegetarian (Lacto-Ovo) Sample 1-Day Menu  Breakfast 1 cup oatmeal, cooked (2 carbohydrate servings)   cup blueberries (1 carbohydrate serving)  11 almonds, without salt  1 cup 1% milk (1 carbohydrate serving)  1 cup coffee  Morning Snack 1 cup fat-free plain yogurt (1 carbohydrate serving)  Lunch 1 whole wheat bun (1 carbohydrate servings)  1 black bean  burger (1 carbohydrate servings)  1 slice cheddar cheese, low sodium  2 slices tomatoes  2 leaves lettuce  1 teaspoon mustard  1 small pear (1 carbohydrate servings)  1 cup green tea, unsweetened  Afternoon Snack 1/3 cup trail mix with nuts, seeds, and raisins, without salt (1 carbohydrate servinga)  Evening Meal  cup meatless chicken  2/3 cup brown rice, cooked (2 carbohydrate servings)  1 cup broccoli, cooked (2/3 carbohydrate serving)   cup carrots, cooked (1/3 carbohydrate serving)  2 teaspoons olive oil  1 teaspoon balsamic vinegar  1 whole wheat dinner roll (1 carbohydrate serving)  1 teaspoon margarine, soft, tub  1 cup 1% milk (1 carbohydrate serving)  Evening Snack 1 extra small banana (1 carbohydrate serving)  1 tablespoon peanut butter   Low sodium- Consistent Carbohydrate Vegan Sample 1-Day Menu  Breakfast 1 cup oatmeal, cooked (2 carbohydrate servings)   cup blueberries (1 carbohydrate serving)  11 almonds, without salt  1 cup soymilk fortified with calcium, vitamin B12, and vitamin D  1 cup coffee  Morning Snack 6 ounces soy yogurt (1 carbohydrate servings)  Lunch 1 whole wheat bun(1 carbohydrate servings)  1 black bean burger (1 carbohydrate serving)  2 slices tomatoes  2 leaves lettuce  1 teaspoon mustard  1 small pear (1 carbohydrate servings)  1 cup green tea, unsweetened  Afternoon Snack 1/3 cup trail mix with nuts, seeds, and raisins, without salt (1 carbohydrate servings)  Evening Meal  cup meatless chicken  2/3 cup brown rice, cooked (2 carbohydrate servings)  1 cup broccoli, cooked (2/3 carbohydrate serving)   cup carrots, cooked (1/3 carbohydrate serving)  2 teaspoons olive oil  1 teaspoon balsamic vinegar  1 whole wheat dinner roll (1 carbohydrate serving)  1 teaspoon margarine, soft, tub  1 cup soymilk fortified with calcium, vitamin B12, and vitamin D  Evening Snack 1 extra small banana (1 carbohydrate serving)  1 tablespoon peanut  butter    Low sodium- Consistent Carbohydrate Sample 1-Day Menu  Breakfast 1 cup cooked oatmeal (2 carbohydrate servings)  3/4 cup blueberries (1 carbohydrate serving)  1 ounce almonds  1 cup skim milk (1 carbohydrate serving)  1 cup coffee  Morning Snack 1 cup sugar-free nonfat yogurt (1 carbohydrate serving)  Lunch 2 slices whole-wheat bread (2 carbohydrate servings)  2 ounces lean [place] breast  1 ounce low-fat Swiss cheese  1 teaspoon mustard  1 slice tomato  1 lettuce leaf  1 small pear (1 carbohydrate serving)  1 cup skim milk (1 carbohydrate serving)  Afternoon Snack 1 ounce trail mix with unsalted nuts, seeds, and raisins (1 carbohydrate serving)  Evening Meal 3 ounces salmon  2/3 cup cooked brown rice (2 carbohydrate servings)  1 teaspoon soft margarine  1 cup cooked broccoli with 1/2 cup cooked carrots (1 carbohydrate serving  Carrots, cooked, boiled, drained, without salt  1 cup lettuce  1 teaspoon olive oil with vinegar for dressing  1 small whole grain  roll (1 carbohydrate serving)  1 teaspoon soft margarine  1 cup unsweetened tea  Evening Snack 1 extra-small banana (1 carbohydrate serving)  Copyright 2020  [employer]y of [employer] Dietetics. All rights reserved.

## 2019-03-20 NOTE — TOC Transition Note (Signed)
Transition of Care [facility]) - CM/SW Discharge Note   Patient Details  Name: Sreenidhi Ganson MRN: [id number] Date of Birth: Mar 04, 1931  Transition [facility] CM/SW Contact:  Bartholomew Crews, RN Phone Number: [phone] 03/20/2019, 2:41 PM   Clinical Narrative:    Patient to transition home today. Verified [place] agency accepted referral. Verified rollator devlivered to room. No further transition of care needs identified.    Final next level of care: [place] Barriers to Discharge: No Barriers Identified   Patient Goals and CMS Choice Patient states their goals for this hospitalization and ongoing recovery are:: to go home CMS Medicare.gov Compare Post Acute Care list provided to:: Patient Choice offered to / list presented to : Patient, Adult Children  Discharge Placement                       Discharge Plan and Services   Discharge Planning Services: CM Consult Post Acute Care Choice: Durable Medical Equipment, Home Health          DME Arranged: Walker rolling with seat DME Agency: [employer] Date DME Agency Contacted: 03/20/19 Time DME Agency Contacted: 1440 Representative spoke with at DME Agency: Thedore Mins HH Arranged: RN, PT, OT [facility] Agency: [place] Date [place]: 03/20/19 Time [place]: 1440 Representative spoke with at [place]: [place]) Interventions     Readmission Risk Interventions No flowsheet data found.

## 2019-03-20 NOTE — Progress Notes (Signed)
DISCHARGE NOTE HOME Abigail Huang to be discharged Home per MD order. Discussed prescriptions and follow up appointments with the patient. Prescriptions given to patient; medication list explained in detail. Patient verbalized understanding.  Skin clean, dry and intact without evidence of skin break down, no evidence of skin tears noted. IV catheter discontinued intact. Site without signs and symptoms of complications. Dressing and pressure applied. Pt denies pain at the site currently. No complaints noted.  Patient free of lines, drains, and wounds.   An After Visit Summary (AVS) was printed and given to the patient. Patient escorted via wheelchair, and discharged home via private auto.  Aneta Mins BSN, RN3

## 2019-03-20 NOTE — Progress Notes (Signed)
Occupational Therapy Treatment Patient Details Name: Abigail Huang MRN: [id number] DOB: 09/12/1930 Today's Date: 03/20/2019    History of present illness Abigail Huang is a 85 y.o. female with medical history significant of hypertension, diabetes, chronic kidney disease stage IIIb, diabetic neuropathy, chronic venous stasis presents to emergency department due to worsening shortness of breath on exertion and chest tightness since 1 week.  Associated with leg swelling, orthopnea   OT comments   Session focus on toilet transfer/ hygiene, standing ADLs, and functional mobility. Pt min guard for stand pivot transfer from [facility] with rollator. Pt supervision for toileting hygiene utilizing lateral leans. Pt supervision- min guard for standing ADLs at sink. Pt using sink for support while standing. Education on sitting on rollator seat to conserve energy during ADL routine as pt reports she does most of the housework at home and often becomes fatigued quickly. Education/ visual demonstration on locking rollator brakes before sit<>tand. Pt return demonstration with good carryover. Pt likely to DC home today with HHOT. Will continue to follow acutely for OT needs.     Follow Up Recommendations  Home health OT    Equipment Recommendations  3 in 1 bedside commode    Recommendations for Other Services      Precautions / Restrictions Precautions Precautions: Fall Precaution Comments: fall risk greatly reduced with use of RW Restrictions Weight Bearing Restrictions: No       Mobility Bed Mobility Overal bed mobility: Modified Independent             General bed mobility comments: use of bed features and elevated HOB  Transfers Overall transfer level: Needs assistance Equipment used: 4-wheeled walker Transfers: Sit to/from [employer] Transfers Sit to Stand: Supervision;Min guard Stand pivot transfers: Min guard       General transfer comment: supervision for  sit>stand with RW; extensive education on locking brakes on rollator before/after sit<>stand; MIN guard for stand pivot transfer from EOB>BSC    Balance Overall balance assessment: Needs assistance Sitting-balance support: No upper extremity supported;Feet supported Sitting balance-Leahy Scale: Good     Standing balance support: No upper extremity supported;During functional activity Standing balance-Leahy Scale: Fair Standing balance comment: pt reaching out for UE support when it is available, min guard dynamically                           ADL either performed or assessed with clinical judgement   ADL Overall ADL's : Needs assistance/impaired     Grooming: Wash/dry hands;Wash/dry face;Oral care;Brushing hair;Supervision/safety;Standing Grooming Details (indicate cue type and reason): supervision for safety while standing at sink     Lower Body Bathing: Min guard;Sit to/from stand;Minimal assistance Lower Body Bathing Details (indicate cue type and reason): MIN guard for standing at sink for LB bathing after BM; MIN A to manage hospital gown and tele box Upper Body Dressing : Minimal assistance;Standing Upper Body Dressing Details (indicate cue type and reason): MIN A to don novel hospital gown     Toilet Transfer: Min guard;BSC;Stand-pivot   Toileting- Water quality scientist and Hygiene: Min guard;Sit to/from stand       Functional mobility during ADLs: Min guard(rollator) General ADL Comments: min guard- supervision for standing ADLs at sink; cues to utilize sitting on rollator seat during ADL routine to conserve energy     Vision   Vision Assessment?: No apparent visual deficits   Perception     Praxis      Cognition Arousal/Alertness: Awake/alert  Behavior During Therapy: WFL for tasks assessed/performed Overall Cognitive Status: Within Functional Limits for tasks assessed                                          Exercises      Shoulder Instructions       General Comments daughter enter at end of session    Pertinent Vitals/ Pain       Pain Assessment: No/denies pain  Home Living                                          Prior Functioning/Environment              Frequency  Min 2X/week        Progress Toward Goals  OT Goals(current goals can now be found in the care plan section)  Progress towards OT goals: Progressing toward goals  Acute Rehab OT Goals Patient Stated Goal: to get home soon OT Goal Formulation: With patient Time For Goal Achievement: 04/02/19 Potential to Achieve Goals: Good  Plan Discharge plan remains appropriate    Co-evaluation                 AM-PAC OT "6 Clicks" Daily Activity     Outcome Measure   Help from another person eating meals?: None Help from another person taking care of personal grooming?: A Little Help from another person toileting, which includes using toliet, bedpan, or urinal?: A Little Help from another person bathing (including washing, rinsing, drying)?: A Little Help from another person to put on and taking off regular upper body clothing?: None Help from another person to put on and taking off regular lower body clothing?: A Little 6 Click Score: 20    End of Session Equipment Utilized During Treatment: Other (comment)(Rollator)  OT Visit Diagnosis: Muscle weakness (generalized) (M62.81);Unsteadiness on feet (R26.81)   Activity Tolerance Patient tolerated treatment well   Patient Left in chair;with call bell/phone within reach;with family/visitor present;with nursing/sitter in room   Nurse Communication          Time: 1122-1203 OT Time Calculation (min): 41 min  Charges: OT General Charges $OT Visit: 1 Visit OT Treatments $Self Care/Home Management : 38-52 mins  [place], [place] [facility] [phone] [place] 03/20/2019, 1:09 PM

## 2019-03-20 NOTE — Discharge Summary (Signed)
Physician Discharge Summary  Abigail Huang VOJ:[id number] DOB: 1930/12/05 DOA: 03/17/2019  PCP: Burnard Bunting, MD  Admit date: 03/17/2019 Discharge date: 03/20/2019  Admitted From: Home Disposition:  Home  Discharge Condition:Stable CODE STATUS:FULL Diet recommendation: Heart Healthy  Brief/Interim Summary:  Abigail Falkner Willardis a 85 y.o.femalewith medical history significant ofhypertension, diabetes, chronic kidney disease stage Iv, diabetic neuropathy and chronic venous stasis.  Patient serum creatinine was 1.98 on 11/08/2016.  Patient was recently seen at one of the [facility] facilities with a serum creatinine of 2.84 on 03/12/2019.  The plan was for the patient to follow-up with nephrology team on outpatient basis but patient has been admitted with shortness of breath.  Patient was admitted with 1 week history of shortness of breath, chest pressure and leg swelling.  On presentation to the hospital, serum creatinine was 3.15, cardiac BNP of 554.  Patient is currently being diuresed with improvement in symptoms.  Echocardiogram revealed possible diastolic dysfunction, with normal EF.  Nephrology was consulted.  Nephrology recommended to continue Lasix 80 mg twice a day for 5 days then continue on 80 mg daily.  We have arranged follow-up with nephrology and cardiology as an outpatient.  She is hemodynamically stable for discharge home today.[place] arranged.   Following problems were addressed during her hospitalization:  Possible acute diastolic congestive heart failure versus acute on chronic diastolic congestive heart failure:  -Patient presented with shortness of breath, leg swelling, orthopnea, PND. proBNP is elevated at 554. -Chest x-ray is negative -Echocardiogram result is noted.  EF of 60 to 65%.  Left ventricular diastolic Doppler parameters revealed indeterminate pattern of LV diastolic filling.  RVSP of 35 mmHg was reported. -Symptoms have resolved completely.   Follow-up with cardiology team on discharge.  Chest tightness: -Troponin was trended. -No further symptoms reported.  Progression to chronic kidney disease stage IV versus acute kidney injury on CKD 4: -Patient is currently progressed to stage V chronic kidney disease.  . -No further work-up as per nephrology team.  Patient will follow with nephrology on discharge.    Diabetes mellitus: -On glipizide and Levemir 8 units daily. -Hemoglobin A1c was 6.5%. -Resume home insulin regimen on discharge.  Hyperlipidemia:  -Continue statin  Hypothyroidism:  -Continue levothyroxine 25 mcg once daily.  Hypertension:  -Blood pressure is reasonably controlled.   -Continue current regimen  Discharge Diagnoses:  Principal Problem:   Acute CHF (congestive heart failure) (HCC) Active Problems:   Diabetes mellitus ([place])   CKD (chronic kidney disease), stage IV (HCC)   Dyspnea on exertion   Hyperlipidemia   Hypothyroidism    Discharge Instructions  Discharge Instructions    Diet - low sodium heart healthy   Complete by: As directed    Discharge instructions   Complete by: As directed    1)Please take prescribed medications as instructed. 2)Follow up with your PCP in a week.  Do a BMP test during the follow-up. 3)You will be called by nephrology and cardiology for the follow-up. 4)Limit fluid intake to less than 1.5 Litres a day.Monitor your weight.   Increase activity slowly   Complete by: As directed      Allergies as of 03/20/2019   No Known Allergies     Medication List    TAKE these medications   amLODipine 10 MG tablet Commonly known as: NORVASC Take 10 mg by mouth daily.   atenolol 50 MG tablet Commonly known as: TENORMIN Take 50 mg by mouth daily.   CALTRATE 600+D PO Take 1  tablet by mouth daily.   CENTRUM SILVER PO Take 1 tablet by mouth daily.   furosemide 80 MG tablet Commonly known as: LASIX Take 1 tablet (80 mg total) by mouth daily. Please take  80 mg twice a day for 5 days then continue 80 mg daily Start taking on: March 21, 2019 What changed:   medication strength  how much to take  additional instructions   glipiZIDE 10 MG 24 hr tablet Commonly known as: GLUCOTROL XL Take 10 mg by mouth daily with breakfast.   insulin detemir 100 UNIT/ML injection Commonly known as: LEVEMIR Inject 8 Units into the skin daily.   levothyroxine 25 MCG tablet Commonly known as: SYNTHROID   Potassium Chloride ER 20 MEQ Tbcr Take 20 mEq by mouth daily. =   simvastatin 20 MG tablet Commonly known as: ZOCOR Take 20 mg by mouth daily.            Durable Medical Equipment  (From admission, onward)         Start     Ordered   03/19/19 1354  For home use only DME 4 wheeled rolling walker with seat  Once    Question:  Patient needs a walker to treat with the following condition  Answer:  Weakness   03/19/19 1354         Follow-up Information    Roney Jaffe, MD Follow up.   Specialty: Nephrology Why: Our office address/ number is above, we will call you this week to set up f/u appt at [facility].  In meantime before you're f/u appt you may call Dr Jonnie Finner (saw you in hospital) at [phone] for assistance Contact information: [place] [address] [phone]        Burnard Bunting, MD. Schedule an appointment as soon as possible for a visit in 1 week(s).   Specialty: Internal Medicine Contact information: [address] [place] [address] [phone]          No Known Allergies  Consultations:  Nephrology   Procedures/Studies: Dg Chest 2 View  Result Date: 03/17/2019 CLINICAL DATA:  Shortness of breath, fullness, chest pain. EXAM: CHEST - 2 VIEW COMPARISON:  03/12/2019 and 11/04/2016 FINDINGS: No signs of consolidation or pleural effusion. Linear opacity in left lung base may represent scarring or atelectasis. Heart size is stable, mildly enlarged with atherosclerotic changes in the aorta.  No acute bone process. IMPRESSION: No active cardiopulmonary disease. Electronically Signed   By: Zetta Bills M.D.   On: 03/17/2019 12:10   Dg Chest 2 View  Result Date: 03/12/2019 CLINICAL DATA:  Lower extremity swelling.  Evaluate for edema. EXAM: CHEST - 2 VIEW COMPARISON:  Radiograph 02/24/2019 FINDINGS: Normal heart size and mediastinal contours. Diffuse bilateral interstitial and peribronchial thickening, perihilar predominant. There are Kerley B-lines. No significant pleural effusion, there is minimal fluid in the fissures. No confluent airspace disease. No pneumothorax. IMPRESSION: Moderate pulmonary edema. Electronically Signed   By: Keith Rake M.D.   On: 03/12/2019 04:00   Dg Chest 2 View  Result Date: 02/24/2019 CLINICAL DATA:  85 year old female with altered mental status EXAM: CHEST - 2 VIEW COMPARISON:  Head chest radiograph dated 11/03/2016 FINDINGS: Diffuse interstitial coarsening and chronic bronchitic changes. Faint areas of density primarily involving the lower lung fields may represent atelectasis or infiltrate. Clinical correlation is recommended. No lobar consolidation, pleural effusion, or pneumothorax. The cardiac silhouette is within normal limits. Atherosclerotic calcification of the aorta. Degenerative changes of the spine. No acute osseous  pathology. IMPRESSION: Bibasilar atelectasis versus developing infiltrate. Electronically Signed   By: Anner Crete M.D.   On: 02/24/2019 15:42   Ct Head Wo Contrast  Result Date: 02/24/2019 CLINICAL DATA:  Confusion, increasing for several days EXAM: CT HEAD WITHOUT CONTRAST TECHNIQUE: Contiguous axial images were obtained from the base of the skull through the vertex without intravenous contrast. COMPARISON:  September 25, 2017 FINDINGS: Brain: Mild diffuse atrophy is stable. There is no intracranial mass, hemorrhage, extra-axial fluid collection, or midline shift. There is patchy small vessel disease in the centra semiovale  bilaterally, stable. No acute infarct is demonstrable on this study. Vascular: There is no hyperdense vessel. There is calcification in each carotid siphon region. Skull: The bony calvarium appears intact. Sinuses/Orbits: There is mucosal thickening in several ethmoid air cells. Other visualized paranasal sinuses are clear. Orbits appear symmetric bilaterally. Other: There is extensive opacification in the left mastoid air cell region, stable. Mastoids on the right are clear. There is debris in the external auditory canals, more on the right than on the left. IMPRESSION: Stable atrophy with periventricular small vessel disease. No acute infarct evident. No mass or hemorrhage. There are foci of arterial vascular calcification. There is extensive chronic left-sided mastoid air cell disease. There is mucosal thickening in several ethmoid air cells. There is probable cerumen in the external auditory canals, more on the right than the left. Electronically Signed   By: Lowella Grip III M.D.   On: 02/24/2019 15:44   Mr Brain Wo Contrast  Result Date: 02/26/2019 CLINICAL DATA:  Disorientation. Altered mental status. Similar episodes over the last month. EXAM: MRI HEAD WITHOUT CONTRAST TECHNIQUE: Multiplanar, multiecho pulse sequences of the brain and surrounding structures were obtained without intravenous contrast. COMPARISON:  CT head without contrast 02/24/2019. MR head 03/08/2012 FINDINGS: Brain: The diffusion-weighted images demonstrate no acute or subacute infarction. Confluent periventricular and the subcortical T2 hyperintensities demonstrate significant progression over the last 7 years. Progressive generalized atrophy is present as well. Atrophy is most evident in the frontal lobes. The ventricles are proportionate to the degree of atrophy. No significant extraaxial fluid collection is present. Vascular: Flow is present in the major intracranial arteries. Skull and upper cervical spine: The craniocervical  junction is normal. Upper cervical spine is within normal limits. Marrow signal is unremarkable. Sinuses/Orbits: Asymmetric chronic left mastoid effusion is again noted. The paranasal sinuses and mastoid air cells are otherwise clear. The right lens has been replaced. Globes and orbits are otherwise normal. IMPRESSION: 1. Progressive atrophy and white matter disease. Extensive white matter changes are moderately advanced for age 23. Atrophy is greatest in the frontal lobes bilaterally. 3. No acute abnormality. 4. Chronic left mastoid effusion. Electronically Signed   By: San Morelle M.D.   On: 02/26/2019 21:57   US Renal  Result Date: 03/17/2019 CLINICAL DATA:  Worsening renal failure EXAM: RENAL / URINARY TRACT ULTRASOUND COMPLETE COMPARISON:  CT 12/29/2010 FINDINGS: Right Kidney: Renal measurements: 10.2 x 4.8 x 4.8 cm = volume: 123 mL. Mild hydronephrosis. Increased renal cortical echogenicity. No mass visualized. Left Kidney: Renal measurements: 7.6 x 3.8 x 3.2 cm = volume: 48 mL. Diffuse renal cortical atrophy with increased echogenicity. Trace left perinephric fluid no mass or hydronephrosis visualized. Bladder: Appears normal for degree of bladder distention. IMPRESSION: 1. Mild right hydronephrosis. 2. The left kidney is atrophic relative to the right. 3. Trace left perinephric fluid.  No left-sided hydronephrosis. 4. Increased bilateral renal parenchymal echogenicity suggesting chronic medical renal disease. Electronically Signed  By: Davina Poke M.D.   On: 03/17/2019 16:39       Subjective:  Patient seen and examined at bedside this morning.  Hemodynamically stable for discharge.  Discharge Exam: Vitals:   03/20/19 0512 03/20/19 0908  BP: (!) 169/71 138/60  Pulse: 71 78  Resp: 17 16  Temp: 97.9 F (36.6 C) 97.8 F (36.6 C)  SpO2: 98% 97%   Vitals:   03/19/19 1700 03/19/19 2104 03/20/19 0512 03/20/19 0908  BP: 135/62 (!) 147/79 (!) 169/71 138/60  Pulse: 70 67 71 78   Resp: 18 18 17 16   Temp: 97.8 F (36.6 C) 98.2 F (36.8 C) 97.9 F (36.6 C) 97.8 F (36.6 C)  TempSrc: Oral  Oral Oral  SpO2: 96% 97% 98% 97%  Weight:      Height:        General: Pt is alert, awake, not in acute distress Cardiovascular: RRR, S1/S2 +, no rubs, no gallops Respiratory: CTA bilaterally, no wheezing, no rhonchi Abdominal: Soft, NT, ND, bowel sounds + Extremities: no edema, no cyanosis    The results of significant diagnostics from this hospitalization (including imaging, microbiology, ancillary and laboratory) are listed below for reference.     Microbiology: Recent Results (from the past 240 hour(s))  SARS Coronavirus 2 [facility] order, Performed in [facility] hospital lab) Nasopharyngeal Nasopharyngeal Swab     Status: None   Collection Time: 03/17/19  2:25 PM   Specimen: Nasopharyngeal Swab  Result Value Ref Range Status   SARS Coronavirus 2 NEGATIVE NEGATIVE Final    Comment: (NOTE) If result is NEGATIVE SARS-CoV-2 target nucleic acids are NOT DETECTED. The SARS-CoV-2 RNA is generally detectable in upper and lower  respiratory specimens during the acute phase of infection. The lowest  concentration of SARS-CoV-2 viral copies this assay can detect is 250  copies / mL. A negative result does not preclude SARS-CoV-2 infection  and should not be used as the sole basis for treatment or other  patient management decisions.  A negative result may occur with  improper specimen collection / handling, submission of specimen other  than nasopharyngeal swab, presence of viral mutation(s) within the  areas targeted by this assay, and inadequate number of viral copies  (<250 copies / mL). A negative result must be combined with clinical  observations, patient history, and epidemiological information. If result is POSITIVE SARS-CoV-2 target nucleic acids are DETECTED. The SARS-CoV-2 RNA is generally detectable in upper and lower  respiratory specimens dur ing the  acute phase of infection.  Positive  results are indicative of active infection with SARS-CoV-2.  Clinical  correlation with patient history and other diagnostic information is  necessary to determine patient infection status.  Positive results do  not rule out bacterial infection or co-infection with other viruses. If result is PRESUMPTIVE POSTIVE SARS-CoV-2 nucleic acids MAY BE PRESENT.   A presumptive positive result was obtained on the submitted specimen  and confirmed on repeat testing.  While 2019 novel coronavirus  (SARS-CoV-2) nucleic acids may be present in the submitted sample  additional confirmatory testing may be necessary for epidemiological  and / or clinical management purposes  to differentiate between  SARS-CoV-2 and other Sarbecovirus currently known to infect humans.  If clinically indicated additional testing with an alternate test  methodology [phone]) is advised. The SARS-CoV-2 RNA is generally  detectable in upper and lower respiratory sp ecimens during the acute  phase of infection. The expected result is Negative. Fact Sheet for Patients:  StrictlyIdeas.no Fact Sheet for Healthcare Providers: BankingDealers.co.za This test is not yet approved or cleared by the [place] FDA and has been authorized for detection and/or diagnosis of SARS-CoV-2 by FDA under an Emergency Use Authorization (EUA).  This EUA will remain in effect (meaning this test can be used) for the duration of the COVID-19 declaration under Section 564(b)(1) of the Act, 21 U.S.C. section 360bbb-3(b)(1), unless the authorization is terminated or revoked sooner. Performed at [facility], [place] [address], [place], [place] [address]      Labs: BNP (last 3 results) Recent Labs    03/12/19 0303 03/17/19 1450  BNP 488.0* 528.4*   Basic Metabolic Panel: Recent Labs  Lab 03/17/19 1148 03/17/19 1558 03/18/19 0401 03/19/19 0352  03/20/19 0544  NA 132*  --  133* 132* 134*  K 4.4  --  4.0 3.8 3.9  CL 98  --  101 96* 97*  CO2 23  --  23 23 24   GLUCOSE 251*  --  206* 207* 151*  BUN 28*  --  29* 33* 38*  CREATININE 3.22* 3.15* 3.12* 3.28* 3.37*  CALCIUM 10.4*  --  9.8 10.1 9.9  MG  --  1.6*  --  1.6*  --   PHOS  --  3.7  --  3.5 3.7   Liver Function Tests: Recent Labs  Lab 03/18/19 0401 03/19/19 0352 03/20/19 0544  AST 15  --   --   ALT 11  --   --   ALKPHOS 53  --   --   BILITOT 0.6  --   --   PROT 6.6  --   --   ALBUMIN 3.1* 3.0* 2.8*   No results for input(s): LIPASE, AMYLASE in the last 168 hours. No results for input(s): AMMONIA in the last 168 hours. CBC: Recent Labs  Lab 03/17/19 1148 03/17/19 1558 03/18/19 0401 03/19/19 0352  WBC 7.2 7.0 6.8 7.4  NEUTROABS  --   --   --  4.5  HGB 12.1 11.2* 10.8* 10.9*  HCT 34.1* 34.1* 33.5* 33.3*  MCV 90.2 90.9 91.0 90.7  PLT 406* 370 373 414*   Cardiac Enzymes: No results for input(s): CKTOTAL, CKMB, CKMBINDEX, TROPONINI in the last 168 hours. BNP: Invalid input(s): POCBNP CBG: Recent Labs  Lab 03/19/19 1127 03/19/19 1615 03/19/19 2103 03/20/19 0713 03/20/19 1205  GLUCAP 301* 125* 164* 138* 281*   D-Dimer No results for input(s): DDIMER in the last 72 hours. Hgb A1c Recent Labs    03/17/19 1558  HGBA1C 6.5*   Lipid Profile Recent Labs    03/17/19 1558  CHOL 156  HDL 48  LDLCALC 84  TRIG 121  CHOLHDL 3.3   Thyroid function studies Recent Labs    03/17/19 1558  TSH 3.689   Anemia work up No results for input(s): VITAMINB12, FOLATE, FERRITIN, TIBC, IRON, RETICCTPCT in the last 72 hours. Urinalysis    Component Value Date/Time   COLORURINE COLORLESS (A) 03/17/2019 1915   APPEARANCEUR CLEAR 03/17/2019 1915   LABSPEC 1.005 03/17/2019 1915   PHURINE 8.0 03/17/2019 1915   GLUCOSEU 50 (A) 03/17/2019 1915   HGBUR NEGATIVE 03/17/2019 [place] 03/17/2019 [place] 03/17/2019 1915    PROTEINUR 100 (A) 03/17/2019 1915   UROBILINOGEN 0.2 03/08/2012 1613   NITRITE NEGATIVE 03/17/2019 1915   LEUKOCYTESUR NEGATIVE 03/17/2019 1915   Sepsis Labs Invalid input(s): PROCALCITONIN,  WBC,  LACTICIDVEN Microbiology Recent Results (from the past 240 hour(s))  SARS Coronavirus 2 [facility] order, Performed in [facility] hospital lab) Nasopharyngeal Nasopharyngeal Swab     Status: None   Collection Time: 03/17/19  2:25 PM   Specimen: Nasopharyngeal Swab  Result Value Ref Range Status   SARS Coronavirus 2 NEGATIVE NEGATIVE Final    Comment: (NOTE) If result is NEGATIVE SARS-CoV-2 target nucleic acids are NOT DETECTED. The SARS-CoV-2 RNA is generally detectable in upper and lower  respiratory specimens during the acute phase of infection. The lowest  concentration of SARS-CoV-2 viral copies this assay can detect is 250  copies / mL. A negative result does not preclude SARS-CoV-2 infection  and should not be used as the sole basis for treatment or other  patient management decisions.  A negative result may occur with  improper specimen collection / handling, submission of specimen other  than nasopharyngeal swab, presence of viral mutation(s) within the  areas targeted by this assay, and inadequate number of viral copies  (<250 copies / mL). A negative result must be combined with clinical  observations, patient history, and epidemiological information. If result is POSITIVE SARS-CoV-2 target nucleic acids are DETECTED. The SARS-CoV-2 RNA is generally detectable in upper and lower  respiratory specimens dur ing the acute phase of infection.  Positive  results are indicative of active infection with SARS-CoV-2.  Clinical  correlation with patient history and other diagnostic information is  necessary to determine patient infection status.  Positive results do  not rule out bacterial infection or co-infection with other viruses. If result is PRESUMPTIVE POSTIVE SARS-CoV-2  nucleic acids MAY BE PRESENT.   A presumptive positive result was obtained on the submitted specimen  and confirmed on repeat testing.  While 2019 novel coronavirus  (SARS-CoV-2) nucleic acids may be present in the submitted sample  additional confirmatory testing may be necessary for epidemiological  and / or clinical management purposes  to differentiate between  SARS-CoV-2 and other Sarbecovirus currently known to infect humans.  If clinically indicated additional testing with an alternate test  methodology [phone]) is advised. The SARS-CoV-2 RNA is generally  detectable in upper and lower respiratory sp ecimens during the acute  phase of infection. The expected result is Negative. Fact Sheet for Patients:  StrictlyIdeas.no Fact Sheet for Healthcare Providers: BankingDealers.co.za This test is not yet approved or cleared by the [place] FDA and has been authorized for detection and/or diagnosis of SARS-CoV-2 by FDA under an Emergency Use Authorization (EUA).  This EUA will remain in effect (meaning this test can be used) for the duration of the COVID-19 declaration under Section 564(b)(1) of the Act, 21 U.S.C. section 360bbb-3(b)(1), unless the authorization is terminated or revoked sooner. Performed at [facility], [place] [address], [place], [place] [address]     Please note: You were cared for by a hospitalist during your hospital stay. Once you are discharged, your primary care physician will handle any further medical issues. Please note that NO REFILLS for any discharge medications will be authorized once you are discharged, as it is imperative that you return to your primary care physician (or establish a relationship with a primary care physician if you do not have one) for your post hospital discharge needs so that they can reassess your need for medications and monitor your lab values.    Time coordinating  discharge: 40 minutes  SIGNED:   Shelly Coss, MD  [facility] 03/20/2019, 1:48 PM Pager [phone]  If 7PM-7AM, please contact night-coverage [email or web address] Password [email or web address]

## 2019-03-20 NOTE — Progress Notes (Signed)
Nutrition Education Note  RD consulted for CKD V education.   Explained why diet restrictions are needed and provided lists of foods to limit. Discussed the importance of controlling diabetes through diet. Discussed different food groups and their effects on blood sugar, emphasizing carbohydrate-containing foods. Discussed importance of controlled and consistent carbohydrate intake throughout the day.   Provided examples on ways to decrease sodium intake in diet. Discouraged intake of processed foods, canned food items, and use of salt shaker. Encouraged fresh fruits and vegetables. Discussed need for fluid restriction per MD order and the importance of minimizing fluid weight gain.   Spoke with daughter on the phone. Reports pt has a "[place] style" diet. She consumes deli meats, bacon, sausage, canned vegetables, and canned soups. Discussed alternatives that follow low sodium consistent carbohydrate diet. Daughter has already purchased Mrs[employer] items and is "cleaning" out the pt's pantry. Answered all questions. Handouts left in discharge instructions. Daughter would like pt to have further outpatient education if possible.   Expect fair compliance.  Body mass index is 25.62 kg/m. Pt meets criteria for over weight based on current BMI.  Current diet order is renal, patient is consuming approximately 50-100% of meals at this time. Labs and medications reviewed. No further nutrition interventions warranted at this time. RD contact information provided. If additional nutrition issues arise, please re-consult RD.  Mariana Single RD, LDN Clinical Nutrition Pager # [phone]

## 2019-03-22 DIAGNOSIS — E871 Hypo-osmolality and hyponatremia: Secondary | ICD-10-CM | POA: Diagnosis not present

## 2019-03-22 DIAGNOSIS — I10 Essential (primary) hypertension: Secondary | ICD-10-CM | POA: Diagnosis not present

## 2019-03-22 DIAGNOSIS — K219 Gastro-esophageal reflux disease without esophagitis: Secondary | ICD-10-CM | POA: Diagnosis not present

## 2019-03-22 DIAGNOSIS — I5031 Acute diastolic (congestive) heart failure: Secondary | ICD-10-CM | POA: Diagnosis not present

## 2019-03-22 DIAGNOSIS — I130 Hypertensive heart and chronic kidney disease with heart failure and stage 1 through stage 4 chronic kidney disease, or unspecified chronic kidney disease: Secondary | ICD-10-CM | POA: Diagnosis not present

## 2019-03-22 DIAGNOSIS — E785 Hyperlipidemia, unspecified: Secondary | ICD-10-CM | POA: Diagnosis not present

## 2019-03-22 DIAGNOSIS — E1129 Type 2 diabetes mellitus with other diabetic kidney complication: Secondary | ICD-10-CM | POA: Diagnosis not present

## 2019-03-22 DIAGNOSIS — M1990 Unspecified osteoarthritis, unspecified site: Secondary | ICD-10-CM | POA: Diagnosis not present

## 2019-03-22 DIAGNOSIS — E039 Hypothyroidism, unspecified: Secondary | ICD-10-CM | POA: Diagnosis not present

## 2019-03-22 DIAGNOSIS — N184 Chronic kidney disease, stage 4 (severe): Secondary | ICD-10-CM | POA: Diagnosis not present

## 2019-03-23 DIAGNOSIS — N184 Chronic kidney disease, stage 4 (severe): Secondary | ICD-10-CM | POA: Diagnosis not present

## 2019-03-23 DIAGNOSIS — J42 Unspecified chronic bronchitis: Secondary | ICD-10-CM | POA: Diagnosis not present

## 2019-03-23 DIAGNOSIS — D631 Anemia in chronic kidney disease: Secondary | ICD-10-CM | POA: Diagnosis not present

## 2019-03-23 DIAGNOSIS — E1140 Type 2 diabetes mellitus with diabetic neuropathy, unspecified: Secondary | ICD-10-CM | POA: Diagnosis not present

## 2019-03-23 DIAGNOSIS — I878 Other specified disorders of veins: Secondary | ICD-10-CM | POA: Diagnosis not present

## 2019-03-23 DIAGNOSIS — I0981 Rheumatic heart failure: Secondary | ICD-10-CM | POA: Diagnosis not present

## 2019-03-23 DIAGNOSIS — I081 Rheumatic disorders of both mitral and tricuspid valves: Secondary | ICD-10-CM | POA: Diagnosis not present

## 2019-03-23 DIAGNOSIS — I130 Hypertensive heart and chronic kidney disease with heart failure and stage 1 through stage 4 chronic kidney disease, or unspecified chronic kidney disease: Secondary | ICD-10-CM | POA: Diagnosis not present

## 2019-03-23 DIAGNOSIS — E1122 Type 2 diabetes mellitus with diabetic chronic kidney disease: Secondary | ICD-10-CM | POA: Diagnosis not present

## 2019-03-23 DIAGNOSIS — I5033 Acute on chronic diastolic (congestive) heart failure: Secondary | ICD-10-CM | POA: Diagnosis not present

## 2019-03-24 DIAGNOSIS — N184 Chronic kidney disease, stage 4 (severe): Secondary | ICD-10-CM | POA: Diagnosis not present

## 2019-03-24 DIAGNOSIS — E1122 Type 2 diabetes mellitus with diabetic chronic kidney disease: Secondary | ICD-10-CM | POA: Diagnosis not present

## 2019-03-24 DIAGNOSIS — D631 Anemia in chronic kidney disease: Secondary | ICD-10-CM | POA: Diagnosis not present

## 2019-03-24 DIAGNOSIS — I081 Rheumatic disorders of both mitral and tricuspid valves: Secondary | ICD-10-CM | POA: Diagnosis not present

## 2019-03-24 DIAGNOSIS — E1140 Type 2 diabetes mellitus with diabetic neuropathy, unspecified: Secondary | ICD-10-CM | POA: Diagnosis not present

## 2019-03-24 DIAGNOSIS — I5033 Acute on chronic diastolic (congestive) heart failure: Secondary | ICD-10-CM | POA: Diagnosis not present

## 2019-03-24 DIAGNOSIS — I0981 Rheumatic heart failure: Secondary | ICD-10-CM | POA: Diagnosis not present

## 2019-03-24 DIAGNOSIS — J42 Unspecified chronic bronchitis: Secondary | ICD-10-CM | POA: Diagnosis not present

## 2019-03-24 DIAGNOSIS — I878 Other specified disorders of veins: Secondary | ICD-10-CM | POA: Diagnosis not present

## 2019-03-24 DIAGNOSIS — I130 Hypertensive heart and chronic kidney disease with heart failure and stage 1 through stage 4 chronic kidney disease, or unspecified chronic kidney disease: Secondary | ICD-10-CM | POA: Diagnosis not present

## 2019-03-27 DIAGNOSIS — E1140 Type 2 diabetes mellitus with diabetic neuropathy, unspecified: Secondary | ICD-10-CM | POA: Diagnosis not present

## 2019-03-27 DIAGNOSIS — I081 Rheumatic disorders of both mitral and tricuspid valves: Secondary | ICD-10-CM | POA: Diagnosis not present

## 2019-03-27 DIAGNOSIS — I130 Hypertensive heart and chronic kidney disease with heart failure and stage 1 through stage 4 chronic kidney disease, or unspecified chronic kidney disease: Secondary | ICD-10-CM | POA: Diagnosis not present

## 2019-03-27 DIAGNOSIS — D631 Anemia in chronic kidney disease: Secondary | ICD-10-CM | POA: Diagnosis not present

## 2019-03-27 DIAGNOSIS — E1122 Type 2 diabetes mellitus with diabetic chronic kidney disease: Secondary | ICD-10-CM | POA: Diagnosis not present

## 2019-03-27 DIAGNOSIS — J42 Unspecified chronic bronchitis: Secondary | ICD-10-CM | POA: Diagnosis not present

## 2019-03-27 DIAGNOSIS — I0981 Rheumatic heart failure: Secondary | ICD-10-CM | POA: Diagnosis not present

## 2019-03-27 DIAGNOSIS — N184 Chronic kidney disease, stage 4 (severe): Secondary | ICD-10-CM | POA: Diagnosis not present

## 2019-03-27 DIAGNOSIS — I5033 Acute on chronic diastolic (congestive) heart failure: Secondary | ICD-10-CM | POA: Diagnosis not present

## 2019-03-27 DIAGNOSIS — I878 Other specified disorders of veins: Secondary | ICD-10-CM | POA: Diagnosis not present

## 2019-03-28 DIAGNOSIS — I081 Rheumatic disorders of both mitral and tricuspid valves: Secondary | ICD-10-CM | POA: Diagnosis not present

## 2019-03-28 DIAGNOSIS — N184 Chronic kidney disease, stage 4 (severe): Secondary | ICD-10-CM | POA: Diagnosis not present

## 2019-03-28 DIAGNOSIS — I130 Hypertensive heart and chronic kidney disease with heart failure and stage 1 through stage 4 chronic kidney disease, or unspecified chronic kidney disease: Secondary | ICD-10-CM | POA: Diagnosis not present

## 2019-03-28 DIAGNOSIS — I0981 Rheumatic heart failure: Secondary | ICD-10-CM | POA: Diagnosis not present

## 2019-03-28 DIAGNOSIS — E1122 Type 2 diabetes mellitus with diabetic chronic kidney disease: Secondary | ICD-10-CM | POA: Diagnosis not present

## 2019-03-28 DIAGNOSIS — E1140 Type 2 diabetes mellitus with diabetic neuropathy, unspecified: Secondary | ICD-10-CM | POA: Diagnosis not present

## 2019-03-28 DIAGNOSIS — J42 Unspecified chronic bronchitis: Secondary | ICD-10-CM | POA: Diagnosis not present

## 2019-03-28 DIAGNOSIS — I5033 Acute on chronic diastolic (congestive) heart failure: Secondary | ICD-10-CM | POA: Diagnosis not present

## 2019-03-28 DIAGNOSIS — D631 Anemia in chronic kidney disease: Secondary | ICD-10-CM | POA: Diagnosis not present

## 2019-03-28 DIAGNOSIS — I878 Other specified disorders of veins: Secondary | ICD-10-CM | POA: Diagnosis not present

## 2019-03-30 DIAGNOSIS — J42 Unspecified chronic bronchitis: Secondary | ICD-10-CM | POA: Diagnosis not present

## 2019-03-30 DIAGNOSIS — I878 Other specified disorders of veins: Secondary | ICD-10-CM | POA: Diagnosis not present

## 2019-03-30 DIAGNOSIS — N184 Chronic kidney disease, stage 4 (severe): Secondary | ICD-10-CM | POA: Diagnosis not present

## 2019-03-30 DIAGNOSIS — I5033 Acute on chronic diastolic (congestive) heart failure: Secondary | ICD-10-CM | POA: Diagnosis not present

## 2019-03-30 DIAGNOSIS — I0981 Rheumatic heart failure: Secondary | ICD-10-CM | POA: Diagnosis not present

## 2019-03-30 DIAGNOSIS — E1122 Type 2 diabetes mellitus with diabetic chronic kidney disease: Secondary | ICD-10-CM | POA: Diagnosis not present

## 2019-03-30 DIAGNOSIS — E1140 Type 2 diabetes mellitus with diabetic neuropathy, unspecified: Secondary | ICD-10-CM | POA: Diagnosis not present

## 2019-03-30 DIAGNOSIS — D631 Anemia in chronic kidney disease: Secondary | ICD-10-CM | POA: Diagnosis not present

## 2019-03-30 DIAGNOSIS — I130 Hypertensive heart and chronic kidney disease with heart failure and stage 1 through stage 4 chronic kidney disease, or unspecified chronic kidney disease: Secondary | ICD-10-CM | POA: Diagnosis not present

## 2019-03-30 DIAGNOSIS — I081 Rheumatic disorders of both mitral and tricuspid valves: Secondary | ICD-10-CM | POA: Diagnosis not present

## 2019-04-03 DIAGNOSIS — I5033 Acute on chronic diastolic (congestive) heart failure: Secondary | ICD-10-CM | POA: Diagnosis not present

## 2019-04-03 DIAGNOSIS — E1140 Type 2 diabetes mellitus with diabetic neuropathy, unspecified: Secondary | ICD-10-CM | POA: Diagnosis not present

## 2019-04-03 DIAGNOSIS — I878 Other specified disorders of veins: Secondary | ICD-10-CM | POA: Diagnosis not present

## 2019-04-03 DIAGNOSIS — I0981 Rheumatic heart failure: Secondary | ICD-10-CM | POA: Diagnosis not present

## 2019-04-03 DIAGNOSIS — J42 Unspecified chronic bronchitis: Secondary | ICD-10-CM | POA: Diagnosis not present

## 2019-04-03 DIAGNOSIS — E1122 Type 2 diabetes mellitus with diabetic chronic kidney disease: Secondary | ICD-10-CM | POA: Diagnosis not present

## 2019-04-03 DIAGNOSIS — I081 Rheumatic disorders of both mitral and tricuspid valves: Secondary | ICD-10-CM | POA: Diagnosis not present

## 2019-04-03 DIAGNOSIS — I130 Hypertensive heart and chronic kidney disease with heart failure and stage 1 through stage 4 chronic kidney disease, or unspecified chronic kidney disease: Secondary | ICD-10-CM | POA: Diagnosis not present

## 2019-04-03 DIAGNOSIS — N184 Chronic kidney disease, stage 4 (severe): Secondary | ICD-10-CM | POA: Diagnosis not present

## 2019-04-03 DIAGNOSIS — D631 Anemia in chronic kidney disease: Secondary | ICD-10-CM | POA: Diagnosis not present

## 2019-04-04 DIAGNOSIS — Z08 Encounter for follow-up examination after completed treatment for malignant neoplasm: Secondary | ICD-10-CM | POA: Diagnosis not present

## 2019-04-04 DIAGNOSIS — L820 Inflamed seborrheic keratosis: Secondary | ICD-10-CM | POA: Diagnosis not present

## 2019-04-04 DIAGNOSIS — Z85828 Personal history of other malignant neoplasm of skin: Secondary | ICD-10-CM | POA: Diagnosis not present

## 2019-04-04 DIAGNOSIS — X32XXXD Exposure to sunlight, subsequent encounter: Secondary | ICD-10-CM | POA: Diagnosis not present

## 2019-04-04 DIAGNOSIS — L570 Actinic keratosis: Secondary | ICD-10-CM | POA: Diagnosis not present

## 2019-04-10 DIAGNOSIS — I130 Hypertensive heart and chronic kidney disease with heart failure and stage 1 through stage 4 chronic kidney disease, or unspecified chronic kidney disease: Secondary | ICD-10-CM | POA: Diagnosis not present

## 2019-04-10 DIAGNOSIS — I878 Other specified disorders of veins: Secondary | ICD-10-CM | POA: Diagnosis not present

## 2019-04-10 DIAGNOSIS — I0981 Rheumatic heart failure: Secondary | ICD-10-CM | POA: Diagnosis not present

## 2019-04-10 DIAGNOSIS — E1122 Type 2 diabetes mellitus with diabetic chronic kidney disease: Secondary | ICD-10-CM | POA: Diagnosis not present

## 2019-04-10 DIAGNOSIS — J42 Unspecified chronic bronchitis: Secondary | ICD-10-CM | POA: Diagnosis not present

## 2019-04-10 DIAGNOSIS — D631 Anemia in chronic kidney disease: Secondary | ICD-10-CM | POA: Diagnosis not present

## 2019-04-10 DIAGNOSIS — I081 Rheumatic disorders of both mitral and tricuspid valves: Secondary | ICD-10-CM | POA: Diagnosis not present

## 2019-04-10 DIAGNOSIS — I5033 Acute on chronic diastolic (congestive) heart failure: Secondary | ICD-10-CM | POA: Diagnosis not present

## 2019-04-10 DIAGNOSIS — N184 Chronic kidney disease, stage 4 (severe): Secondary | ICD-10-CM | POA: Diagnosis not present

## 2019-04-10 DIAGNOSIS — E1140 Type 2 diabetes mellitus with diabetic neuropathy, unspecified: Secondary | ICD-10-CM | POA: Diagnosis not present

## 2019-04-11 DIAGNOSIS — N185 Chronic kidney disease, stage 5: Secondary | ICD-10-CM | POA: Diagnosis not present

## 2019-04-11 DIAGNOSIS — I120 Hypertensive chronic kidney disease with stage 5 chronic kidney disease or end stage renal disease: Secondary | ICD-10-CM | POA: Diagnosis not present

## 2019-04-11 DIAGNOSIS — E1122 Type 2 diabetes mellitus with diabetic chronic kidney disease: Secondary | ICD-10-CM | POA: Diagnosis not present

## 2019-04-12 NOTE — Progress Notes (Deleted)
    would rec 80 po bid at dc x 5 days then 80 po daily.

## 2019-04-13 ENCOUNTER — Ambulatory Visit: Payer: Medicare HMO | Admitting: Physician Assistant

## 2019-04-17 DIAGNOSIS — J42 Unspecified chronic bronchitis: Secondary | ICD-10-CM | POA: Diagnosis not present

## 2019-04-17 DIAGNOSIS — I5033 Acute on chronic diastolic (congestive) heart failure: Secondary | ICD-10-CM | POA: Diagnosis not present

## 2019-04-17 DIAGNOSIS — I0981 Rheumatic heart failure: Secondary | ICD-10-CM | POA: Diagnosis not present

## 2019-04-17 DIAGNOSIS — E1122 Type 2 diabetes mellitus with diabetic chronic kidney disease: Secondary | ICD-10-CM | POA: Diagnosis not present

## 2019-04-17 DIAGNOSIS — N184 Chronic kidney disease, stage 4 (severe): Secondary | ICD-10-CM | POA: Diagnosis not present

## 2019-04-17 DIAGNOSIS — I081 Rheumatic disorders of both mitral and tricuspid valves: Secondary | ICD-10-CM | POA: Diagnosis not present

## 2019-04-17 DIAGNOSIS — D631 Anemia in chronic kidney disease: Secondary | ICD-10-CM | POA: Diagnosis not present

## 2019-04-17 DIAGNOSIS — I878 Other specified disorders of veins: Secondary | ICD-10-CM | POA: Diagnosis not present

## 2019-04-17 DIAGNOSIS — E1140 Type 2 diabetes mellitus with diabetic neuropathy, unspecified: Secondary | ICD-10-CM | POA: Diagnosis not present

## 2019-04-17 DIAGNOSIS — I130 Hypertensive heart and chronic kidney disease with heart failure and stage 1 through stage 4 chronic kidney disease, or unspecified chronic kidney disease: Secondary | ICD-10-CM | POA: Diagnosis not present

## 2019-04-18 DIAGNOSIS — E038 Other specified hypothyroidism: Secondary | ICD-10-CM | POA: Diagnosis not present

## 2019-04-24 ENCOUNTER — Ambulatory Visit: Payer: Medicare HMO | Admitting: Physician Assistant

## 2019-05-09 DIAGNOSIS — B078 Other viral warts: Secondary | ICD-10-CM | POA: Diagnosis not present

## 2019-05-09 DIAGNOSIS — D044 Carcinoma in situ of skin of scalp and neck: Secondary | ICD-10-CM | POA: Diagnosis not present

## 2019-05-09 DIAGNOSIS — L218 Other seborrheic dermatitis: Secondary | ICD-10-CM | POA: Diagnosis not present

## 2019-05-26 ENCOUNTER — Emergency Department (HOSPITAL_BASED_OUTPATIENT_CLINIC_OR_DEPARTMENT_OTHER): Payer: Medicare HMO

## 2019-05-26 ENCOUNTER — Other Ambulatory Visit: Payer: Self-pay

## 2019-05-26 ENCOUNTER — Encounter (HOSPITAL_BASED_OUTPATIENT_CLINIC_OR_DEPARTMENT_OTHER): Payer: Self-pay | Admitting: *Deleted

## 2019-05-26 ENCOUNTER — Emergency Department (HOSPITAL_BASED_OUTPATIENT_CLINIC_OR_DEPARTMENT_OTHER)
Admission: EM | Admit: 2019-05-26 | Discharge: 2019-05-26 | Disposition: A | Discharge status: Home / Self Care | Payer: Medicare HMO | Attending: Emergency Medicine | Admitting: Emergency Medicine

## 2019-05-26 DIAGNOSIS — S46911A Strain of unspecified muscle, fascia and tendon at shoulder and upper arm level, right arm, initial encounter: Secondary | ICD-10-CM | POA: Diagnosis not present

## 2019-05-26 DIAGNOSIS — X500XXA Overexertion from strenuous movement or load, initial encounter: Secondary | ICD-10-CM | POA: Diagnosis not present

## 2019-05-26 DIAGNOSIS — M25511 Pain in right shoulder: Secondary | ICD-10-CM | POA: Diagnosis not present

## 2019-05-26 DIAGNOSIS — E785 Hyperlipidemia, unspecified: Secondary | ICD-10-CM | POA: Diagnosis not present

## 2019-05-26 DIAGNOSIS — S4991XA Unspecified injury of right shoulder and upper arm, initial encounter: Secondary | ICD-10-CM | POA: Diagnosis not present

## 2019-05-26 DIAGNOSIS — Z79899 Other long term (current) drug therapy: Secondary | ICD-10-CM | POA: Diagnosis not present

## 2019-05-26 DIAGNOSIS — E1122 Type 2 diabetes mellitus with diabetic chronic kidney disease: Secondary | ICD-10-CM | POA: Diagnosis not present

## 2019-05-26 DIAGNOSIS — I509 Heart failure, unspecified: Secondary | ICD-10-CM | POA: Insufficient documentation

## 2019-05-26 DIAGNOSIS — I130 Hypertensive heart and chronic kidney disease with heart failure and stage 1 through stage 4 chronic kidney disease, or unspecified chronic kidney disease: Secondary | ICD-10-CM | POA: Insufficient documentation

## 2019-05-26 DIAGNOSIS — Z87891 Personal history of nicotine dependence: Secondary | ICD-10-CM | POA: Diagnosis not present

## 2019-05-26 DIAGNOSIS — Y999 Unspecified external cause status: Secondary | ICD-10-CM | POA: Diagnosis not present

## 2019-05-26 DIAGNOSIS — N184 Chronic kidney disease, stage 4 (severe): Secondary | ICD-10-CM | POA: Diagnosis not present

## 2019-05-26 DIAGNOSIS — Y9389 Activity, other specified: Secondary | ICD-10-CM | POA: Diagnosis not present

## 2019-05-26 DIAGNOSIS — Y929 Unspecified place or not applicable: Secondary | ICD-10-CM | POA: Diagnosis not present

## 2019-05-26 DIAGNOSIS — Z7984 Long term (current) use of oral hypoglycemic drugs: Secondary | ICD-10-CM | POA: Diagnosis not present

## 2019-05-26 MED ORDER — NAPROXEN 500 MG PO TABS: 500.0000 mg | ORAL_TABLET | Freq: Two times a day (BID) | ORAL | 0 refills | Status: DC | PRN

## 2019-05-26 MED ORDER — IBUPROFEN 400 MG PO TABS
600.0000 mg | ORAL_TABLET | Freq: Once | ORAL | Status: AC
  Administered 2019-05-26: 600 mg via ORAL
  Filled 2019-05-26: qty 1

## 2019-05-26 MED ORDER — DEXAMETHASONE SODIUM PHOSPHATE 10 MG/ML IJ SOLN: 10.0000 mg | Freq: Once | INTRAMUSCULAR | Status: DC

## 2019-05-26 MED FILL — NAPROXEN 500 MG TABS: 500 | 15 days supply | Qty: 30 | Fill #0

## 2019-05-26 NOTE — ED Triage Notes (Signed)
She was scooting herself up in the bed and felt pain in her right shoulder.

## 2019-05-26 NOTE — ED Provider Notes (Signed)
[facility] EMERGENCY DEPARTMENT Provider Note   CSN: [id number] Arrival date & time: 05/26/19  1142     History Chief Complaint  Patient presents with  . Shoulder Pain    Abigail Huang is a 85 y.o. female.  Pt presents to the ED today with right shoulder pain.  She was pulling herself up in bed about 3 days ago and felt a pain in her right shoulder.  She has taken tylenol for pain.  She can move her right arm in most directions, except she has a hard time feeding herself.          Past Medical History:  Diagnosis Date  . Diabetes mellitus   . Hyperlipidemia   . Hypertension     Patient Active Problem List   Diagnosis Date Noted  . Acute on chronic renal failure ([place]) 03/17/2019  . Hypothyroidism 03/17/2019  . Acute CHF (congestive heart failure) ([place]) 03/17/2019  . Essential hypertension 01/06/2017  . Dyspnea on exertion 01/06/2017  . Hyperlipidemia 01/06/2017  . Closed displaced fracture of right femoral neck ([place]) 11/03/2016  . Diabetes mellitus ([place]) 11/03/2016  . Hyponatremia 11/03/2016  . Leukocytosis 11/03/2016  . Normocytic anemia 11/03/2016  . CKD (chronic kidney disease), stage IV ([place]) 11/03/2016    Past Surgical History:  Procedure Laterality Date  . ABDOMINAL HYSTERECTOMY    . ANTERIOR APPROACH HEMI HIP ARTHROPLASTY Right 11/04/2016   Procedure: ANTERIOR APPROACH HEMI HIP ARTHROPLASTY;  Surgeon: Dorna Leitz, MD;  Location: [facility];  Service: Orthopedics;  Laterality: Right;  . COLONOSCOPY    . POLYPECTOMY       OB History   No obstetric history on file.     Family History  Problem Relation Age of Onset  . Heart disease Mother   . Heart disease Father   . Heart disease Sister     Social History   Tobacco Use  . Smoking status: Former Smoker    Quit date: 06/27/1967    Years since quitting: 51.9  . Smokeless tobacco: Never Used  Substance Use Topics  . Alcohol use: No  . Drug use: No    Home Medications Prior to  Admission medications   Medication Sig Start Date End Date Taking? Authorizing Provider  amLODipine (NORVASC) 10 MG tablet Take 10 mg by mouth daily.    [provider]  atenolol (TENORMIN) 50 MG tablet Take 50 mg by mouth daily.     [provider]  Calcium Carbonate-Vitamin D (CALTRATE 600+D PO) Take 1 tablet by mouth daily.     [provider]  furosemide (LASIX) 80 MG tablet Take 1 tablet (80 mg total) by mouth daily. Please take 80 mg twice a day for 5 days then continue 80 mg daily 03/21/19 04/20/19  Shelly Coss, MD  glipiZIDE (GLUCOTROL XL) 10 MG 24 hr tablet Take 10 mg by mouth daily with breakfast.    [provider]  insulin detemir (LEVEMIR) 100 UNIT/ML injection Inject 8 Units into the skin daily.     [provider]  levothyroxine (SYNTHROID) 25 MCG tablet  02/13/19   [provider]  Multiple Vitamins-Minerals (CENTRUM SILVER PO) Take 1 tablet by mouth daily.    [provider]  naproxen (NAPROSYN) 500 MG tablet Take 1 tablet (500 mg total) by mouth 2 (two) times daily as needed. 05/26/19   Isla Pence, MD  potassium chloride 20 MEQ TBCR Take 20 mEq by mouth daily. = 03/20/19   Shelly Coss, MD  simvastatin (ZOCOR) 20 MG tablet Take 20 mg by mouth daily.    [provider]    Allergies    Patient has no known allergies.  Review of Systems   Review of Systems  Musculoskeletal:       Right shoulder pain  All other systems reviewed and are negative.   Physical Exam Updated Vital Signs BP (!) 146/69 (BP Location: Left Arm)   Pulse 65   Temp 97.9 F (36.6 C) (Oral)   Resp 16   Ht 5\' 6"  (1.676 m)   Wt 63.5 kg   SpO2 100%   BMI 22.60 kg/m   Physical Exam Vitals and nursing note reviewed.  Constitutional:      Appearance: Normal appearance.  HENT:     Head: Normocephalic and atraumatic.     Right Ear: External ear normal.     Left Ear: External ear normal.     Nose: Nose normal.      Mouth/Throat:     Mouth: Mucous membranes are moist.     Pharynx: Oropharynx is clear.  Eyes:     Extraocular Movements: Extraocular movements intact.     Conjunctiva/sclera: Conjunctivae normal.     Pupils: Pupils are equal, round, and reactive to light.  Cardiovascular:     Rate and Rhythm: Normal rate and regular rhythm.     Pulses: Normal pulses.     Heart sounds: Normal heart sounds.  Pulmonary:     Effort: Pulmonary effort is normal.     Breath sounds: Normal breath sounds.  Abdominal:     General: Abdomen is flat. Bowel sounds are normal.     Palpations: Abdomen is soft.  Musculoskeletal:     Right shoulder: Decreased range of motion.     Cervical back: Normal range of motion and neck supple.  Skin:    General: Skin is warm.     Capillary Refill: Capillary refill takes less than 2 seconds.  Neurological:     General: No focal deficit present.     Mental Status: She is alert and oriented to person, place, and time.  Psychiatric:        Mood and Affect: Mood normal.        Behavior: Behavior normal.     ED Results / Procedures / Treatments   Labs (all labs ordered are listed, but only abnormal results are displayed) Labs Reviewed - No data to display  EKG None  Radiology DG Shoulder Right  Result Date: 05/26/2019 CLINICAL DATA:  Right shoulder injury. Pain with movement. EXAM: RIGHT SHOULDER - 2+ VIEW COMPARISON:  Two-view chest x-ray 03/17/2019 FINDINGS: Degenerative changes are noted at the [facility] joint and rotator cuff insertion. Shoulder is located. No acute abnormalities are present. Right hemithorax is clear. Clavicle is unremarkable. IMPRESSION: 1. Stable degenerative changes of the right shoulder. 2. No acute abnormality. Electronically Signed   By: San Morelle M.D.   On: 05/26/2019 12:50    Procedures Procedures (including critical care time)  Medications Ordered in ED Medications  ibuprofen (ADVIL) tablet 600 mg (600 mg Oral Given 05/26/19 1321)     ED Course  I have reviewed the triage vital signs and the nursing notes.  Pertinent labs & imaging results that were available during my care of the patient were reviewed by me and considered in my medical decision making (see chart for details).    MDM Rules/Calculators/A&P     CHA2DS2/VAS Stroke Risk Points      N/A >=  2 Points: High Risk  1 - 1.99 Points: Medium Risk  0 Points: Low Risk    A final score could not be computed because of missing components.: Last  Change: N/A     This score determines the patient's risk of having a stroke if the  patient has atrial fibrillation.      This score is not applicable to this patient. Components are not  calculated.                  No fx.  Pt placed in a sling and instructed to f/u with ortho.  Return if worse.   Final Clinical Impression(s) / ED Diagnoses Final diagnoses:  Strain of right shoulder, initial encounter    Rx / DC Orders ED Discharge Orders         Ordered    naproxen (NAPROSYN) 500 MG tablet  2 times daily PRN     05/26/19 1303           Isla Pence, MD 05/27/19 [phone]

## 2019-06-07 DIAGNOSIS — X32XXXD Exposure to sunlight, subsequent encounter: Secondary | ICD-10-CM | POA: Diagnosis not present

## 2019-06-07 DIAGNOSIS — Z08 Encounter for follow-up examination after completed treatment for malignant neoplasm: Secondary | ICD-10-CM | POA: Diagnosis not present

## 2019-06-07 DIAGNOSIS — Z85828 Personal history of other malignant neoplasm of skin: Secondary | ICD-10-CM | POA: Diagnosis not present

## 2019-06-07 DIAGNOSIS — L570 Actinic keratosis: Secondary | ICD-10-CM | POA: Diagnosis not present

## 2019-07-10 DIAGNOSIS — N185 Chronic kidney disease, stage 5: Secondary | ICD-10-CM | POA: Diagnosis not present

## 2019-07-10 DIAGNOSIS — I120 Hypertensive chronic kidney disease with stage 5 chronic kidney disease or end stage renal disease: Secondary | ICD-10-CM | POA: Diagnosis not present

## 2019-07-10 DIAGNOSIS — E1122 Type 2 diabetes mellitus with diabetic chronic kidney disease: Secondary | ICD-10-CM | POA: Diagnosis not present

## 2019-07-19 DIAGNOSIS — I130 Hypertensive heart and chronic kidney disease with heart failure and stage 1 through stage 4 chronic kidney disease, or unspecified chronic kidney disease: Secondary | ICD-10-CM | POA: Diagnosis not present

## 2019-07-19 DIAGNOSIS — Z1331 Encounter for screening for depression: Secondary | ICD-10-CM | POA: Diagnosis not present

## 2019-07-19 DIAGNOSIS — N184 Chronic kidney disease, stage 4 (severe): Secondary | ICD-10-CM | POA: Diagnosis not present

## 2019-07-19 DIAGNOSIS — E785 Hyperlipidemia, unspecified: Secondary | ICD-10-CM | POA: Diagnosis not present

## 2019-07-19 DIAGNOSIS — E1129 Type 2 diabetes mellitus with other diabetic kidney complication: Secondary | ICD-10-CM | POA: Diagnosis not present

## 2019-07-19 DIAGNOSIS — E039 Hypothyroidism, unspecified: Secondary | ICD-10-CM | POA: Diagnosis not present

## 2019-07-19 DIAGNOSIS — I5031 Acute diastolic (congestive) heart failure: Secondary | ICD-10-CM | POA: Diagnosis not present

## 2019-07-20 DIAGNOSIS — E1129 Type 2 diabetes mellitus with other diabetic kidney complication: Secondary | ICD-10-CM | POA: Diagnosis not present

## 2019-07-25 DIAGNOSIS — H40013 Open angle with borderline findings, low risk, bilateral: Secondary | ICD-10-CM | POA: Diagnosis not present

## 2019-07-25 DIAGNOSIS — E119 Type 2 diabetes mellitus without complications: Secondary | ICD-10-CM | POA: Diagnosis not present

## 2019-07-25 DIAGNOSIS — H524 Presbyopia: Secondary | ICD-10-CM | POA: Diagnosis not present

## 2019-08-02 DIAGNOSIS — L570 Actinic keratosis: Secondary | ICD-10-CM | POA: Diagnosis not present

## 2019-08-02 DIAGNOSIS — X32XXXD Exposure to sunlight, subsequent encounter: Secondary | ICD-10-CM | POA: Diagnosis not present

## 2019-08-23 DIAGNOSIS — H2512 Age-related nuclear cataract, left eye: Secondary | ICD-10-CM | POA: Diagnosis not present

## 2019-08-23 DIAGNOSIS — H25812 Combined forms of age-related cataract, left eye: Secondary | ICD-10-CM | POA: Diagnosis not present

## 2019-09-03 ENCOUNTER — Emergency Department (HOSPITAL_BASED_OUTPATIENT_CLINIC_OR_DEPARTMENT_OTHER): Payer: Medicare HMO

## 2019-09-03 ENCOUNTER — Emergency Department (HOSPITAL_BASED_OUTPATIENT_CLINIC_OR_DEPARTMENT_OTHER)
Admission: EM | Admit: 2019-09-03 | Discharge: 2019-09-03 | Disposition: A | Discharge status: Home / Self Care | Payer: Medicare HMO | Attending: Emergency Medicine | Admitting: Emergency Medicine

## 2019-09-03 ENCOUNTER — Other Ambulatory Visit: Payer: Self-pay

## 2019-09-03 ENCOUNTER — Encounter (HOSPITAL_BASED_OUTPATIENT_CLINIC_OR_DEPARTMENT_OTHER): Payer: Self-pay | Admitting: Emergency Medicine

## 2019-09-03 DIAGNOSIS — E039 Hypothyroidism, unspecified: Secondary | ICD-10-CM | POA: Insufficient documentation

## 2019-09-03 DIAGNOSIS — R1084 Generalized abdominal pain: Secondary | ICD-10-CM | POA: Diagnosis not present

## 2019-09-03 DIAGNOSIS — K449 Diaphragmatic hernia without obstruction or gangrene: Secondary | ICD-10-CM

## 2019-09-03 DIAGNOSIS — N184 Chronic kidney disease, stage 4 (severe): Secondary | ICD-10-CM | POA: Diagnosis not present

## 2019-09-03 DIAGNOSIS — Z794 Long term (current) use of insulin: Secondary | ICD-10-CM | POA: Insufficient documentation

## 2019-09-03 DIAGNOSIS — Z87891 Personal history of nicotine dependence: Secondary | ICD-10-CM | POA: Insufficient documentation

## 2019-09-03 DIAGNOSIS — K5900 Constipation, unspecified: Secondary | ICD-10-CM | POA: Insufficient documentation

## 2019-09-03 DIAGNOSIS — Z96641 Presence of right artificial hip joint: Secondary | ICD-10-CM | POA: Diagnosis not present

## 2019-09-03 DIAGNOSIS — E1122 Type 2 diabetes mellitus with diabetic chronic kidney disease: Secondary | ICD-10-CM | POA: Insufficient documentation

## 2019-09-03 DIAGNOSIS — Z79899 Other long term (current) drug therapy: Secondary | ICD-10-CM | POA: Insufficient documentation

## 2019-09-03 DIAGNOSIS — I509 Heart failure, unspecified: Secondary | ICD-10-CM | POA: Insufficient documentation

## 2019-09-03 DIAGNOSIS — I130 Hypertensive heart and chronic kidney disease with heart failure and stage 1 through stage 4 chronic kidney disease, or unspecified chronic kidney disease: Secondary | ICD-10-CM | POA: Insufficient documentation

## 2019-09-03 DIAGNOSIS — R109 Unspecified abdominal pain: Secondary | ICD-10-CM | POA: Diagnosis present

## 2019-09-03 LAB — CBC WITH DIFFERENTIAL/PLATELET
Abs Immature Granulocytes: 0.02 10*3/uL (ref 0.00–0.07)
Basophils Absolute: 0 10*3/uL (ref 0.0–0.1)
Basophils Relative: 1 %
Eosinophils Absolute: 0.2 10*3/uL (ref 0.0–0.5)
Eosinophils Relative: 2 %
HCT: 35.6 % — ABNORMAL LOW (ref 36.0–46.0)
Hemoglobin: 11.3 g/dL — ABNORMAL LOW (ref 12.0–15.0)
Immature Granulocytes: 0 %
Lymphocytes Relative: 22 %
Lymphs Abs: 1.7 10*3/uL (ref 0.7–4.0)
MCH: 29.6 pg (ref 26.0–34.0)
MCHC: 31.7 g/dL (ref 30.0–36.0)
MCV: 93.2 fL (ref 80.0–100.0)
Monocytes Absolute: 0.8 10*3/uL (ref 0.1–1.0)
Monocytes Relative: 10 %
Neutro Abs: 4.9 10*3/uL (ref 1.7–7.7)
Neutrophils Relative %: 65 %
Platelets: 280 10*3/uL (ref 150–400)
RBC: 3.82 MIL/uL — ABNORMAL LOW (ref 3.87–5.11)
RDW: 13.1 % (ref 11.5–15.5)
WBC: 7.6 10*3/uL (ref 4.0–10.5)
nRBC: 0 % (ref 0.0–0.2)

## 2019-09-03 LAB — COMPREHENSIVE METABOLIC PANEL
ALT: 15 U/L (ref 0–44)
AST: 16 U/L (ref 15–41)
Albumin: 4 g/dL (ref 3.5–5.0)
Alkaline Phosphatase: 58 U/L (ref 38–126)
Anion gap: 8 (ref 5–15)
BUN: 52 mg/dL — ABNORMAL HIGH (ref 8–23)
CO2: 23 mmol/L (ref 22–32)
Calcium: 9.9 mg/dL (ref 8.9–10.3)
Chloride: 101 mmol/L (ref 98–111)
Creatinine, Ser: 3.8 mg/dL — ABNORMAL HIGH (ref 0.44–1.00)
GFR calc Af Amer: 12 mL/min — ABNORMAL LOW (ref >60)
GFR calc non Af Amer: 10 mL/min — ABNORMAL LOW (ref >60)
Glucose, Bld: 131 mg/dL — ABNORMAL HIGH (ref 70–99)
Potassium: 4.4 mmol/L (ref 3.5–5.1)
Sodium: 132 mmol/L — ABNORMAL LOW (ref 135–145)
Total Bilirubin: 0.4 mg/dL (ref 0.3–1.2)
Total Protein: 7.8 g/dL (ref 6.5–8.1)

## 2019-09-03 LAB — URINALYSIS, ROUTINE W REFLEX MICROSCOPIC
Bilirubin Urine: NEGATIVE
Glucose, UA: 100 mg/dL — AB
Ketones, ur: NEGATIVE mg/dL
Nitrite: NEGATIVE
Protein, ur: 100 mg/dL — AB
Specific Gravity, Urine: 1.02 (ref 1.005–1.030)
pH: 5.5 (ref 5.0–8.0)

## 2019-09-03 LAB — URINALYSIS, MICROSCOPIC (REFLEX)

## 2019-09-03 LAB — LIPASE, BLOOD: Lipase: 84 U/L — ABNORMAL HIGH (ref 11–51)

## 2019-09-03 NOTE — ED Notes (Signed)
Pt ambulatory with steady gait to room. Pt reports mild sob with ambulation WNL for pt.

## 2019-09-03 NOTE — Discharge Instructions (Addendum)
Please follow-up with primary doctor regarding the symptoms you are experiencing today.  If you develop worsening pain, vomiting, fever or other new concerning symptom return to ER for reassessment.

## 2019-09-03 NOTE — ED Triage Notes (Signed)
Pt c/o right side abdominal pain x 2 weeks. Pt denies N/V, reports taking OTC med for constipation unknown med name.

## 2019-09-04 NOTE — ED Provider Notes (Signed)
[facility] EMERGENCY DEPARTMENT Provider Note   CSN: [id number] Arrival date & time: 09/03/19  1135     History Chief Complaint  Patient presents with  . Abdominal Pain    Abigail Huang is a 86 y.o. female.  Presents to ER with complaint of abdominal pain.  Reports over the past couple weeks she has been having mild dull pain.  Throughout abdomen, worse on right side, sensation of a fullness.  No nausea or vomiting, no diarrhea.  Has had some intermittent constipation, passing gas.  No blood in stool.  No fever.  Past abdominal surgical history of hysterectomy, no other surgeries.  HPI     Past Medical History:  Diagnosis Date  . Diabetes mellitus   . Hyperlipidemia   . Hypertension     Patient Active Problem List   Diagnosis Date Noted  . Acute on chronic renal failure ([place]) 03/17/2019  . Hypothyroidism 03/17/2019  . Acute CHF (congestive heart failure) ([place]) 03/17/2019  . Essential hypertension 01/06/2017  . Dyspnea on exertion 01/06/2017  . Hyperlipidemia 01/06/2017  . Closed displaced fracture of right femoral neck ([place]) 11/03/2016  . Diabetes mellitus ([place]) 11/03/2016  . Hyponatremia 11/03/2016  . Leukocytosis 11/03/2016  . Normocytic anemia 11/03/2016  . CKD (chronic kidney disease), stage IV ([place]) 11/03/2016    Past Surgical History:  Procedure Laterality Date  . ABDOMINAL HYSTERECTOMY    . ANTERIOR APPROACH HEMI HIP ARTHROPLASTY Right 11/04/2016   Procedure: ANTERIOR APPROACH HEMI HIP ARTHROPLASTY;  Surgeon: Dorna Leitz, MD;  Location: [facility];  Service: Orthopedics;  Laterality: Right;  . COLONOSCOPY    . POLYPECTOMY       OB History   No obstetric history on file.     Family History  Problem Relation Age of Onset  . Heart disease Mother   . Heart disease Father   . Heart disease Sister     Social History   Tobacco Use  . Smoking status: Former Smoker    Quit date: 06/27/1967    Years since quitting: 52.2  . Smokeless  tobacco: Never Used  Substance Use Topics  . Alcohol use: No  . Drug use: No    Home Medications Prior to Admission medications   Medication Sig Start Date End Date Taking? Authorizing Provider  amLODipine (NORVASC) 10 MG tablet Take 10 mg by mouth daily.    [provider]  atenolol (TENORMIN) 50 MG tablet Take 50 mg by mouth daily.     [provider]  Calcium Carbonate-Vitamin D (CALTRATE 600+D PO) Take 1 tablet by mouth daily.     [provider]  furosemide (LASIX) 80 MG tablet Take 1 tablet (80 mg total) by mouth daily. Please take 80 mg twice a day for 5 days then continue 80 mg daily 03/21/19 04/20/19  Shelly Coss, MD  glipiZIDE (GLUCOTROL XL) 10 MG 24 hr tablet Take 10 mg by mouth daily with breakfast.    [provider]  insulin detemir (LEVEMIR) 100 UNIT/ML injection Inject 8 Units into the skin daily.     [provider]  levothyroxine (SYNTHROID) 25 MCG tablet  02/13/19   [provider]  Multiple Vitamins-Minerals (CENTRUM SILVER PO) Take 1 tablet by mouth daily.    [provider]  naproxen (NAPROSYN) 500 MG tablet Take 1 tablet (500 mg total) by mouth 2 (two) times daily as needed. 05/26/19   Isla Pence, MD  potassium chloride 20 MEQ TBCR Take 20 mEq by mouth  daily. = 03/20/19   Shelly Coss, MD  simvastatin (ZOCOR) 20 MG tablet Take 20 mg by mouth daily.    [provider]    Allergies    Patient has no known allergies.  Review of Systems   Review of Systems  Constitutional: Negative for chills and fever.  HENT: Negative for ear pain and sore throat.   Eyes: Negative for pain and visual disturbance.  Respiratory: Negative for cough and shortness of breath.   Cardiovascular: Negative for chest pain and palpitations.  Gastrointestinal: Positive for abdominal pain. Negative for vomiting.  Genitourinary: Negative for dysuria and hematuria.  Musculoskeletal: Negative for arthralgias and back  pain.  Skin: Negative for color change and rash.  Neurological: Negative for seizures and syncope.  All other systems reviewed and are negative.   Physical Exam Updated Vital Signs BP (!) 170/70   Pulse 72   Temp 98 F (36.7 C) (Oral)   Resp 16   Ht 5\' 6"  (1.676 m)   Wt 63 kg   SpO2 98%   BMI 22.44 kg/m   Physical Exam Vitals and nursing note reviewed.  Constitutional:      General: She is not in acute distress.    Appearance: She is well-developed.  HENT:     Head: Normocephalic and atraumatic.  Eyes:     Conjunctiva/sclera: Conjunctivae normal.  Cardiovascular:     Rate and Rhythm: Normal rate and regular rhythm.     Heart sounds: No murmur.  Pulmonary:     Effort: Pulmonary effort is normal. No respiratory distress.     Breath sounds: Normal breath sounds.  Abdominal:     Palpations: Abdomen is soft.     Tenderness: There is no abdominal tenderness.     Comments: No focal tenderness on exam, no mass, no rebound or guarding  Musculoskeletal:     Cervical back: Neck supple.  Skin:    General: Skin is warm and dry.  Neurological:     Mental Status: She is alert.     ED Results / Procedures / Treatments   Labs (all labs ordered are listed, but only abnormal results are displayed) Labs Reviewed  CBC WITH DIFFERENTIAL/PLATELET - Abnormal; Notable for the following components:      Result Value   RBC 3.82 (*)    Hemoglobin 11.3 (*)    HCT 35.6 (*)    All other components within normal limits  COMPREHENSIVE METABOLIC PANEL - Abnormal; Notable for the following components:   Sodium 132 (*)    Glucose, Bld 131 (*)    BUN 52 (*)    Creatinine, Ser 3.80 (*)    GFR calc non Af Amer 10 (*)    GFR calc Af Amer 12 (*)    All other components within normal limits  LIPASE, BLOOD - Abnormal; Notable for the following components:   Lipase 84 (*)    All other components within normal limits  URINALYSIS, ROUTINE W REFLEX MICROSCOPIC - Abnormal; Notable for the  following components:   APPearance HAZY (*)    Glucose, UA 100 (*)    Hgb urine dipstick SMALL (*)    Protein, ur 100 (*)    Leukocytes,Ua TRACE (*)    All other components within normal limits  URINALYSIS, MICROSCOPIC (REFLEX) - Abnormal; Notable for the following components:   Bacteria, UA FEW (*)    All other components within normal limits    EKG None  Radiology CT ABDOMEN PELVIS WO CONTRAST  Result Date: 09/03/2019 CLINICAL DATA:  Right-sided abdominal pain EXAM: CT ABDOMEN AND PELVIS WITHOUT CONTRAST TECHNIQUE: Multidetector CT imaging of the abdomen and pelvis was performed following the standard protocol without IV contrast. COMPARISON:  12/29/2010 FINDINGS: Lower chest: Circumferential thickening of the GE junction (series 2, image 7). Visualized lung bases clear. Hepatobiliary: Subcentimeter low-density lesion within the inferior aspect of the right hepatic lobe, unchanged from 2012, most likely reflecting a small cyst or hemangioma. Liver is otherwise unremarkable. Normal appearing gallbladder without hyperdense gallstone. No biliary dilatation. Pancreas: Unremarkable. No pancreatic ductal dilatation or surrounding inflammatory changes. Spleen: Normal in size without focal abnormality. Adrenals/Urinary Tract: Left kidney is mildly atrophic, similar to prior. Single punctate calcification within the left renal pelvis which may reflect a nonobstructing stone or small vascular calcification. No hydronephrosis. Right kidney is within normal limits. Right extrarenal pelvis again incidentally noted. Bilateral ureters are unremarkable. Visualized portion of the bladder appears unremarkable, although partially obscured by metallic streak artifact from hip arthroplasty. Stomach/Bowel: Small hiatal hernia with thickened appearance of the GE junction. Appendix appears normal. No evidence of bowel wall thickening, distention, or inflammatory changes. Vascular/Lymphatic: Extensive aortoiliac  atherosclerotic calcification without aneurysm. No abdominopelvic lymphadenopathy. Reproductive: Status post hysterectomy. No adnexal masses. Other: No abdominal wall hernia or abnormality. No abdominopelvic ascites. Musculoskeletal: Prior right hip arthroplasty, grossly intact. Chronic healed left pubic rami fractures. Multilevel degenerative changes of the lumbar spine, most pronounced at L3-4. No acute osseous abnormality. IMPRESSION: 1. No acute abdominopelvic findings. 2. Small hiatal hernia with circumferentially thickened appearance of the GE junction, possibly representing esophagitis. Consider further evaluation with endoscopy, as clinically indicated. 3. Punctate calcification within the left renal pelvis may reflect a nonobstructing stone or small vascular calcification. No hydronephrosis. Aortic Atherosclerosis (ICD10-I70.0). Electronically Signed   By: Davina Poke D.O.   On: 09/03/2019 13:40    Procedures Procedures (including critical care time)  Medications Ordered in ED Medications - No data to display  ED Course  I have reviewed the triage vital signs and the nursing notes.  Pertinent labs & imaging results that were available during my care of the patient were reviewed by me and considered in my medical decision making (see chart for details).    MDM Rules/Calculators/A&P                      86 year old lady presenting to ER with isolated abdominal pain, fullness.  On exam she is noted to be well-appearing, no focal pain identified.  Given age, symptoms, check CT abdomen pelvis to further evaluate.  No acute abnormality found.  Hiatal hernia noted with some circumferential thickening of the GE junction raising possibility of esophagitis.  Does not fit clinically.  Recommend recheck with primary doctor to discuss the symptoms today as well as the CT findings.  She does not have active ongoing pain in ER at this time.  Vitals stable. Believe she is appropriate for discharge  and outpatient management.    After the discussed management above, the patient was determined to be safe for discharge.  The patient was in agreement with this plan and all questions regarding their care were answered.  ED return precautions were discussed and the patient will return to the ED with any significant worsening of condition.   Final Clinical Impression(s) / ED Diagnoses Final diagnoses:  Generalized abdominal pain  Hiatal hernia    Rx / DC Orders ED Discharge Orders    None  Lucrezia Starch, MD 09/04/19 1007

## 2019-09-12 DIAGNOSIS — R1084 Generalized abdominal pain: Secondary | ICD-10-CM | POA: Diagnosis not present

## 2019-09-12 DIAGNOSIS — K209 Esophagitis, unspecified without bleeding: Secondary | ICD-10-CM | POA: Diagnosis not present

## 2019-09-12 DIAGNOSIS — I130 Hypertensive heart and chronic kidney disease with heart failure and stage 1 through stage 4 chronic kidney disease, or unspecified chronic kidney disease: Secondary | ICD-10-CM | POA: Diagnosis not present

## 2019-09-12 DIAGNOSIS — I5032 Chronic diastolic (congestive) heart failure: Secondary | ICD-10-CM | POA: Diagnosis not present

## 2019-09-12 DIAGNOSIS — N184 Chronic kidney disease, stage 4 (severe): Secondary | ICD-10-CM | POA: Diagnosis not present

## 2019-09-12 DIAGNOSIS — K449 Diaphragmatic hernia without obstruction or gangrene: Secondary | ICD-10-CM | POA: Diagnosis not present

## 2019-09-13 DIAGNOSIS — L570 Actinic keratosis: Secondary | ICD-10-CM | POA: Diagnosis not present

## 2019-09-13 DIAGNOSIS — X32XXXD Exposure to sunlight, subsequent encounter: Secondary | ICD-10-CM | POA: Diagnosis not present

## 2019-10-06 ENCOUNTER — Ambulatory Visit (INDEPENDENT_AMBULATORY_CARE_PROVIDER_SITE_OTHER): Payer: Medicare HMO | Admitting: Physician Assistant

## 2019-10-06 ENCOUNTER — Encounter: Payer: Self-pay | Admitting: Physician Assistant

## 2019-10-06 VITALS — BP 130/70 | HR 82 | Temp 98.4°F | Ht 66.5 in | Wt 147.0 lb

## 2019-10-06 DIAGNOSIS — R0781 Pleurodynia: Secondary | ICD-10-CM | POA: Diagnosis not present

## 2019-10-06 NOTE — Progress Notes (Signed)
Chief Complaint: Right sided abdominal pain  HPI:    Abigail Huang is an 86 year old female with a past medical history as listed below, known to Dr. Henrene Pastor, who was referred to me by Burnard Bunting, MD for a complaint of right-sided abdominal pain.     10/09/2010 surveillance colonoscopy for history of adenomatous colon polyps.  This was completely normal.    09/03/2019 patient was seen in the ER for abdominal pain.  She reported mild dull pain for the past few weeks throughout her abdomen but worse on the right side with a sensation of fullness.  Also had some intermittent constipation.  Labs including a CBC with hemoglobin 11.3, CMP with a creatinine elevated at 3.8, lipase minimally elevated 84 and otherwise normal.  CT abdomen pelvis without contrast showed no acute abdominopelvic findings, small hiatal hernia was circumferentially was thickened appearance of the GE junction, possibly representing esophagitis, punctate calcification in the left renal pelvis may reflect a nonobstructing stone or small vascular calcification.    Today, the patient presents to clinic accompanied by her daughter.  Patient tells me that about 2 months ago she developed a right-sided "abdominal pain".  Explained that this just kind of came on out of the blue.  She has really had no other GI symptoms though, her bowel movements are regular and she has no nausea, vomiting, heartburn or reflux.  Her PCP tried to put her on pantoprazole but that made no change either.  Tells me that this pain is constantly there throughout the day and sometimes seems worse than others.  It does hurt a little bit when she takes a deep breath.  Also when she moves around.  Tells me this pain does not stop her from going to sleep at night but when she wakes up it is still there.  Constantly rated as a 5-6/10.      Interestingly patient does report a fall where she hit the right side of her body about 2-1/2 months ago or so when she was getting up  in the middle the night to go to the bathroom.    Denies fever, chills, change in bowel habits, weight loss or other GI symptoms.  Past Medical History:  Diagnosis Date  . Diabetes mellitus   . Hyperlipidemia   . Hypertension     Past Surgical History:  Procedure Laterality Date  . ABDOMINAL HYSTERECTOMY    . ANTERIOR APPROACH HEMI HIP ARTHROPLASTY Right 11/04/2016   Procedure: ANTERIOR APPROACH HEMI HIP ARTHROPLASTY;  Surgeon: Dorna Leitz, MD;  Location: [facility];  Service: Orthopedics;  Laterality: Right;  . COLONOSCOPY    . POLYPECTOMY      Current Outpatient Medications  Medication Sig Dispense Refill  . amLODipine (NORVASC) 10 MG tablet Take 10 mg by mouth daily.    Marland Kitchen atenolol (TENORMIN) 50 MG tablet Take 50 mg by mouth daily.     . Calcium Carbonate-Vitamin D (CALTRATE 600+D PO) Take 1 tablet by mouth daily.     . furosemide (LASIX) 80 MG tablet Take 1 tablet (80 mg total) by mouth daily. Please take 80 mg twice a day for 5 days then continue 80 mg daily 60 tablet 0  . glipiZIDE (GLUCOTROL XL) 10 MG 24 hr tablet Take 10 mg by mouth daily with breakfast.    . insulin detemir (LEVEMIR) 100 UNIT/ML injection Inject 8 Units into the skin daily.     Marland Kitchen levothyroxine (SYNTHROID) 25 MCG tablet     . Multiple Vitamins-Minerals (  CENTRUM SILVER PO) Take 1 tablet by mouth daily.    . naproxen (NAPROSYN) 500 MG tablet Take 1 tablet (500 mg total) by mouth 2 (two) times daily as needed. 30 tablet 0  . potassium chloride 20 MEQ TBCR Take 20 mEq by mouth daily. = 30 tablet 0  . simvastatin (ZOCOR) 20 MG tablet Take 20 mg by mouth daily.     No current facility-administered medications for this visit.    Allergies as of 10/06/2019  . (No Known Allergies)    Family History  Problem Relation Age of Onset  . Heart disease Mother   . Heart disease Father   . Heart disease Sister     Social History   Socioeconomic History  . Marital status: Married    Spouse name: Not on file  .  Number of children: Not on file  . Years of education: Not on file  . Highest education level: Not on file  Occupational History  . Not on file  Tobacco Use  . Smoking status: Former Smoker    Quit date: 06/27/1967    Years since quitting: 52.3  . Smokeless tobacco: Never Used  Substance and Sexual Activity  . Alcohol use: No  . Drug use: No  . Sexual activity: Not on file  Other Topics Concern  . Not on file  Social History Narrative  . Not on file   Social Determinants of Health   Financial Resource Strain:   . Difficulty of Paying Living Expenses:   Food Insecurity:   . Worried About Charity fundraiser in the Last Year:   . Arboriculturist in the Last Year:   Transportation Needs:   . Film/video editor (Medical):   Marland Kitchen Lack of Transportation (Non-Medical):   Physical Activity:   . Days of Exercise per Week:   . Minutes of Exercise per Session:   Stress:   . Feeling of Stress :   Social Connections:   . Frequency of Communication with Friends and Family:   . Frequency of Social Gatherings with Friends and Family:   . Attends Religious Services:   . Active Member of Clubs or Organizations:   . Attends Archivist Meetings:   Marland Kitchen Marital Status:   Intimate Partner Violence:   . Fear of Current or Ex-Partner:   . Emotionally Abused:   Marland Kitchen Physically Abused:   . Sexually Abused:     Review of Systems:    Constitutional: No weight loss, fever or chills Skin: No rash Cardiovascular: No chest pain Respiratory: No SOB Gastrointestinal: See HPI and otherwise negative Genitourinary: No dysuria  Neurological: No headache Musculoskeletal: No new muscle or joint pain Hematologic: No bleeding  Psychiatric: No history of depression or anxiety   Physical Exam:  Vital signs: BP 130/70   Pulse 82   Temp 98.4 F (36.9 C)   Ht 5' 6.5" (1.689 m)   Wt 147 lb (66.7 kg)   BMI 23.37 kg/m   Constitutional:   Pleasant elderly Caucasian female appears to be in NAD,  Well developed, Well nourished, alert and cooperative Head:  Normocephalic and atraumatic. Eyes:   PEERL, EOMI. No icterus. Conjunctiva pink. Ears:  Normal auditory acuity. Neck:  Supple Throat: Oral cavity and pharynx without inflammation, swelling or lesion.  Respiratory: Respirations even and unlabored. Lungs clear to auscultation bilaterally.   No wheezes, crackles, or rhonchi.  Cardiovascular: Normal S1, S2. No MRG. Regular rate and rhythm. No peripheral edema, cyanosis  or pallor.  Gastrointestinal:  Soft, nondistended, nontender. No rebound or guarding. Normal bowel sounds. No appreciable masses or hepatomegaly. Rectal:  Not performed.  Msk:  Symmetrical without gross deformities. Without edema, no deformity or joint abnormality.  + Tenderness to palpation over lower right rib cage Neurologic:  Alert and  oriented x4;  grossly normal neurologically.  Skin:   Dry and intact without significant lesions or rashes. Psychiatric: Demonstrates good judgement and reason without abnormal affect or behaviors.  RELEVANT LABS AND IMAGING: CBC    Component Value Date/Time   WBC 7.6 09/03/2019 1200   RBC 3.82 (L) 09/03/2019 1200   HGB 11.3 (L) 09/03/2019 1200   HCT 35.6 (L) 09/03/2019 1200   PLT 280 09/03/2019 1200   MCV 93.2 09/03/2019 1200   MCH 29.6 09/03/2019 1200   MCHC 31.7 09/03/2019 1200   RDW 13.1 09/03/2019 1200   LYMPHSABS 1.7 09/03/2019 1200   MONOABS 0.8 09/03/2019 1200   EOSABS 0.2 09/03/2019 1200   BASOSABS 0.0 09/03/2019 1200    CMP     Component Value Date/Time   NA 132 (L) 09/03/2019 1200   K 4.4 09/03/2019 1200   CL 101 09/03/2019 1200   CO2 23 09/03/2019 1200   GLUCOSE 131 (H) 09/03/2019 1200   BUN 52 (H) 09/03/2019 1200   CREATININE 3.80 (H) 09/03/2019 1200   CALCIUM 9.9 09/03/2019 1200   PROT 7.8 09/03/2019 1200   ALBUMIN 4.0 09/03/2019 1200   AST 16 09/03/2019 1200   ALT 15 09/03/2019 1200   ALKPHOS 58 09/03/2019 1200   BILITOT 0.4 09/03/2019 1200    GFRNONAA 10 (L) 09/03/2019 1200   GFRAA 12 (L) 09/03/2019 1200    Assessment: 1.  Right-sided rib pain: Recent CT abdomen pelvis with no abnormality on the right side, patient is not tender in her abdomen at all, no change in GI symptoms at all either, a fall 2-1/2 months ago was reported and pain started about 2 months ago per patient, patient is tender over the right side of her rib cage with palpation; likely musculoskeletal and related to her fall  Plan: 1.  Patient cannot take NSAIDs due to her kidney disease.  Recommend that she use heating pads over this area as often as she can.  If the pain does not significantly improve over the next 2 months then would recommend a chest x-ray. 2.  Patient to follow in clinic with [place] as needed.  [name], PA-C [facility] 10/06/2019, 2:52 PM  Cc: Burnard Bunting, MD

## 2019-10-06 NOTE — Patient Instructions (Signed)
If you are age 84 or older, your body mass index should be between 23-30. Your Body mass index is 23.37 kg/m. If this is out of the aforementioned range listed, please consider follow up with your Primary Care Provider.  If you are age 44 or younger, your body mass index should be between 19-25. Your Body mass index is 23.37 kg/m. If this is out of the aformentioned range listed, please consider follow up with your Primary Care Provider.    Costochondritis Costochondritis is swelling and irritation (inflammation) of the tissue (cartilage) that connects your ribs to your breastbone (sternum). This causes pain in the front of your chest. Usually, the pain:  Starts gradually.  Is in more than one rib. This condition usually goes away on its own over time. Follow these instructions at home:  Do not do anything that makes your pain worse.  If directed, put ice on the painful area: ? Put ice in a plastic bag. ? Place a towel between your skin and the bag. ? Leave the ice on for 20 minutes, 2-3 times a day.  If directed, put heat on the affected area as often as told by your doctor. Use the heat source that your doctor tells you to use, such as a moist heat pack or a heating pad. ? Place a towel between your skin and the heat source. ? Leave the heat on for 20-30 minutes. ? Take off the heat if your skin turns bright red. This is very important if you cannot feel pain, heat, or cold. You may have a greater risk of getting burned.  Take over-the-counter and prescription medicines only as told by your doctor.  Return to your normal activities as told by your doctor. Ask your doctor what activities are safe for you.  Keep all follow-up visits as told by your doctor. This is important. Contact a doctor if:  You have chills or a fever.  Your pain does not go away or it gets worse.  You have a cough that does not go away. Get help right away if:  You are short of breath. This  information is not intended to replace advice given to you by your health care provider. Make sure you discuss any questions you have with your health care provider. Document Revised: 06/16/2017 Document Reviewed: 09/25/2015 Elsevier Patient Education  2020 [employer].

## 2019-10-09 NOTE — Progress Notes (Signed)
Assessment reviewed 

## 2019-10-16 DIAGNOSIS — N184 Chronic kidney disease, stage 4 (severe): Secondary | ICD-10-CM | POA: Diagnosis not present

## 2019-10-16 DIAGNOSIS — I130 Hypertensive heart and chronic kidney disease with heart failure and stage 1 through stage 4 chronic kidney disease, or unspecified chronic kidney disease: Secondary | ICD-10-CM | POA: Diagnosis not present

## 2019-10-16 DIAGNOSIS — R1084 Generalized abdominal pain: Secondary | ICD-10-CM | POA: Diagnosis not present

## 2019-10-16 DIAGNOSIS — M545 Low back pain: Secondary | ICD-10-CM | POA: Diagnosis not present

## 2019-10-16 DIAGNOSIS — K209 Esophagitis, unspecified without bleeding: Secondary | ICD-10-CM | POA: Diagnosis not present

## 2019-10-16 DIAGNOSIS — I5032 Chronic diastolic (congestive) heart failure: Secondary | ICD-10-CM | POA: Diagnosis not present

## 2019-10-20 DIAGNOSIS — Z0100 Encounter for examination of eyes and vision without abnormal findings: Secondary | ICD-10-CM | POA: Diagnosis not present

## 2019-11-09 DIAGNOSIS — N185 Chronic kidney disease, stage 5: Secondary | ICD-10-CM | POA: Diagnosis not present

## 2019-11-09 DIAGNOSIS — E1122 Type 2 diabetes mellitus with diabetic chronic kidney disease: Secondary | ICD-10-CM | POA: Diagnosis not present

## 2019-11-09 DIAGNOSIS — I120 Hypertensive chronic kidney disease with stage 5 chronic kidney disease or end stage renal disease: Secondary | ICD-10-CM | POA: Diagnosis not present

## 2019-11-15 DIAGNOSIS — I872 Venous insufficiency (chronic) (peripheral): Secondary | ICD-10-CM | POA: Diagnosis not present

## 2019-11-15 DIAGNOSIS — N184 Chronic kidney disease, stage 4 (severe): Secondary | ICD-10-CM | POA: Diagnosis not present

## 2019-11-15 DIAGNOSIS — K219 Gastro-esophageal reflux disease without esophagitis: Secondary | ICD-10-CM | POA: Diagnosis not present

## 2019-11-15 DIAGNOSIS — R609 Edema, unspecified: Secondary | ICD-10-CM | POA: Diagnosis not present

## 2019-11-15 DIAGNOSIS — I5032 Chronic diastolic (congestive) heart failure: Secondary | ICD-10-CM | POA: Diagnosis not present

## 2019-11-15 DIAGNOSIS — M1990 Unspecified osteoarthritis, unspecified site: Secondary | ICD-10-CM | POA: Diagnosis not present

## 2019-11-15 DIAGNOSIS — E1129 Type 2 diabetes mellitus with other diabetic kidney complication: Secondary | ICD-10-CM | POA: Diagnosis not present

## 2019-11-15 DIAGNOSIS — I130 Hypertensive heart and chronic kidney disease with heart failure and stage 1 through stage 4 chronic kidney disease, or unspecified chronic kidney disease: Secondary | ICD-10-CM | POA: Diagnosis not present

## 2019-11-24 DIAGNOSIS — M67911 Unspecified disorder of synovium and tendon, right shoulder: Secondary | ICD-10-CM | POA: Diagnosis not present

## 2019-12-15 DIAGNOSIS — M25511 Pain in right shoulder: Secondary | ICD-10-CM | POA: Diagnosis not present

## 2019-12-28 ENCOUNTER — Encounter: Payer: Self-pay | Admitting: Physical Therapy

## 2019-12-28 ENCOUNTER — Other Ambulatory Visit: Payer: Self-pay

## 2019-12-28 ENCOUNTER — Ambulatory Visit: Payer: Medicare HMO | Attending: Surgical | Admitting: Physical Therapy

## 2019-12-28 DIAGNOSIS — M6281 Muscle weakness (generalized): Secondary | ICD-10-CM | POA: Diagnosis not present

## 2019-12-28 DIAGNOSIS — M25511 Pain in right shoulder: Secondary | ICD-10-CM | POA: Diagnosis not present

## 2019-12-28 NOTE — Therapy (Signed)
[facility] [place], [place], [address] Phone: [phone]   Fax:  [phone]  Physical Therapy Evaluation  Patient Details  Name: Abigail Huang MRN: [id number] Date of Birth: 1930/10/21 Referring Provider (PT): Grier Mitts, PA-C   Encounter Date: 12/28/2019   PT End of Session - 12/28/19 2146    Visit Number 1    Number of Visits 6    Date for PT Re-Evaluation 02/15/20    Authorization Type Progress note every 10th visit    PT Start Time 1115    PT Stop Time 1158    PT Time Calculation (min) 43 min    Activity Tolerance Patient tolerated treatment well    Behavior During Therapy [facility] for tasks assessed/performed           Past Medical History:  Diagnosis Date   Diabetes mellitus    Hyperlipidemia    Hypertension     Past Surgical History:  Procedure Laterality Date   ABDOMINAL HYSTERECTOMY     ANTERIOR APPROACH HEMI HIP ARTHROPLASTY Right 11/04/2016   Procedure: ANTERIOR APPROACH HEMI HIP ARTHROPLASTY;  Surgeon: Dorna Leitz, MD;  Location: [facility];  Service: Orthopedics;  Laterality: Right;   COLONOSCOPY     POLYPECTOMY      There were no vitals filed for this visit.    Subjective Assessment - 12/28/19 2135    Subjective COVID-19 screening performed upon arrival.Patient arrives to physical therapy with reports of right shoulder pain, decreased overhead ROM, and pain with ADLs due to a fall on her right shoulder two months ago. Patient reports getting an injection but with some relief. Patient reports ability to perform ADLs but with pain and increased time. Patient has husband assist with home activities as needed. Patient reports pain at worst as 6-7/10 and pain at best as 2/10. Patients goals are to decrease pain, improve movement, sleep longer, and have less difficulties with performing ADLs and home activities.    Pertinent History HTN, DM, CHF, CKD, Anterior hip Right    Limitations  Lifting;House hold activities    Diagnostic tests x-ray: reports tear in RTC but not surgical candidate.    Patient Stated Goals improve movement    Currently in Pain? Yes    Pain Score 2     Pain Location Shoulder    Pain Orientation Right    Pain Descriptors / Indicators Sore;Throbbing;Sharp    Pain Type Acute pain    Pain Onset More than a month ago    Pain Frequency Intermittent    Aggravating Factors  moving it, touching it    Pain Relieving Factors lying down, heat, ice, OTC patch    Effect of Pain on Daily Activities pain with ADLs              [facility] PT Assessment - 12/28/19 0001      Assessment   Medical Diagnosis Right shoulder trap and periscap spasm, rot cuff syndrome    Referring Provider (PT) Danielle laliberte, PA-C    Onset Date/Surgical Date --   MAy 2021   Hand Dominance Right    Next MD Visit 01/12/2020    Prior Therapy no      Precautions   Precautions Fall      Restrictions   Weight Bearing Restrictions No      Balance Screen   Has the patient fallen in the past 6 months Yes    How many times? 1    Has the patient had  a decrease in activity level because of a fear of falling?  Yes    Is the patient reluctant to leave their home because of a fear of falling?  Yes      [place] Private residence    Living Arrangements Spouse/significant other      Prior Function   Level of Independence Independent      Observation/Other Assessments   Observations right arm held in guarded position      Posture/Postural Control   Posture/Postural Control Postural limitations    Postural Limitations Rounded Shoulders;Forward head      ROM / Strength   AROM / PROM / Strength AROM;Strength;PROM      AROM   AROM Assessment Site Shoulder    Right/Left Shoulder Right    Right Shoulder Flexion 130 Degrees    Right Shoulder ABduction 144 Degrees    Right Shoulder Internal Rotation 60 Degrees   T10   Right Shoulder External Rotation  45 Degrees   T1     PROM   PROM Assessment Site Shoulder    Right/Left Shoulder Right    Right Shoulder Flexion 160 Degrees    Right Shoulder ABduction 119 Degrees    Right Shoulder Internal Rotation 80 Degrees    Right Shoulder External Rotation 50 Degrees      Strength   Strength Assessment Site Shoulder    Right/Left Shoulder Right;Left    Right Shoulder Flexion 3/5    Right Shoulder ABduction 3/5    Right Shoulder Internal Rotation 4-/5    Right Shoulder External Rotation 3+/5      Palpation   Palpation comment tender to right UT and supraspinatus belly. slight tenderness to middle delotid region                      Objective measurements completed on examination: See above findings.               PT Education - 12/28/19 2140    Education Details AAROM shoulder flexion, ER and press ups in supine    Person(s) Educated Patient    Methods Explanation;Demonstration;Handout    Comprehension Verbalized understanding;Returned demonstration               PT Long Term Goals - 12/28/19 2148      PT LONG TERM GOAL #1   Title Patient will be independent with HEP and its progression.    Time 6    Period Weeks    Status New      PT LONG TERM GOAL #2   Title Patient will demonstrate 145+ degrees of right shoulder flexion to improve overhead activities.    Time 6    Period Weeks    Status New      PT LONG TERM GOAL #3   Title Patient will demonstrate 60+ degrees of right shoulder ER AROM to improve donning and doffing apparel.    Time 6    Period Weeks    Status New      PT LONG TERM GOAL #4   Title Patent will report ability to perform ADLs independently with right shoulder pain less than or equal to 2/10.    Time 6    Period Weeks    Status New                  Plan - 12/28/19 2249    Clinical Impression Statement Patient is an 86 year old  female who presents to physical therapy with acute right shoulder pain, decreased ROM,  and abnormal posture due to a fall about 2 months ago. Patient tender to right UT and supraspinatus belly with slight tenderness to middle deltoid region. Patient and PT discussed plan of care and discussed HEP to which patient reported understanding. Due to finances, patient will attend PT 1x per week with emphasis on home program. Patient in agreement. Patient would benefit from skilled physical therapy to address deficits and patients goals.    Personal Factors and Comorbidities Age;Comorbidity 2    Comorbidities HTN, DM, CHF, CKD, right Anterior THA    Examination-Activity Limitations Sleep;Lift;Reach Overhead    Examination-Participation Restrictions Cleaning    Stability/Clinical Decision Making Stable/Uncomplicated    Clinical Decision Making Low    Rehab Potential Fair    PT Frequency 1x / week    PT Duration 6 weeks    PT Treatment/Interventions ADLs/Self Care Home Management;Cryotherapy;Electrical Stimulation;Moist Heat;Ultrasound;Therapeutic activities;Therapeutic exercise;Balance training;Neuromuscular re-education;Manual techniques;Passive range of motion;Patient/family education;Vasopneumatic Device    PT Next Visit Plan AAROM to right shoulder PROM, provide updated HEP; modalties as needed.    PT Home Exercise Plan see patient education section    Consulted and Agree with Plan of Care Patient           Patient will benefit from skilled therapeutic intervention in order to improve the following deficits and impairments:  Decreased activity tolerance, Decreased strength, Difficulty walking, Impaired UE functional use, Pain, Postural dysfunction  Visit Diagnosis: Acute pain of right shoulder  Muscle weakness (generalized)     Problem List Patient Active Problem List   Diagnosis Date Noted   Acute on chronic renal failure ([facility]) 03/17/2019   Hypothyroidism 03/17/2019   Acute CHF (congestive heart failure) ([facility]) 03/17/2019   Essential hypertension 01/06/2017    Dyspnea on exertion 01/06/2017   Hyperlipidemia 01/06/2017   Closed displaced fracture of right femoral neck ([facility]) 11/03/2016   Diabetes mellitus ([facility]) 11/03/2016   Hyponatremia 11/03/2016   Leukocytosis 11/03/2016   Normocytic anemia 11/03/2016   CKD (chronic kidney disease), stage IV ([facility]) 11/03/2016    Gabriela Eves, PT, DPT 12/28/2019, 11:03 PM  [facility] [address] [place], [place], [address] Phone: [phone]   Fax:  [phone]  Name: Abigail Huang MRN: [id number] Date of Birth: 08/05/1930

## 2020-01-03 ENCOUNTER — Ambulatory Visit: Payer: Medicare HMO | Admitting: Physical Therapy

## 2020-01-03 ENCOUNTER — Other Ambulatory Visit: Payer: Self-pay

## 2020-01-03 ENCOUNTER — Encounter: Payer: Self-pay | Admitting: Physical Therapy

## 2020-01-03 DIAGNOSIS — M25511 Pain in right shoulder: Secondary | ICD-10-CM | POA: Diagnosis not present

## 2020-01-03 DIAGNOSIS — M6281 Muscle weakness (generalized): Secondary | ICD-10-CM

## 2020-01-03 NOTE — Therapy (Signed)
[facility] [place], [place], [address] Phone: [phone]   Fax:  [phone]  Physical Therapy Treatment  Patient Details  Name: Abigail Huang MRN: [id number] Date of Birth: Oct 16, 1930 Referring Provider (PT): Grier Mitts, PA-C   Encounter Date: 01/03/2020   PT End of Session - 01/03/20 1120    Visit Number 2    Number of Visits 6    Date for PT Re-Evaluation 02/15/20    Authorization Type Progress note every 10th visit    PT Start Time 1115    PT Stop Time 1205    PT Time Calculation (min) 50 min    Activity Tolerance Patient tolerated treatment well    Behavior During Therapy [facility] for tasks assessed/performed           Past Medical History:  Diagnosis Date  . Diabetes mellitus   . Hyperlipidemia   . Hypertension     Past Surgical History:  Procedure Laterality Date  . ABDOMINAL HYSTERECTOMY    . ANTERIOR APPROACH HEMI HIP ARTHROPLASTY Right 11/04/2016   Procedure: ANTERIOR APPROACH HEMI HIP ARTHROPLASTY;  Surgeon: Dorna Leitz, MD;  Location: [place];  Service: Orthopedics;  Laterality: Right;  . COLONOSCOPY    . POLYPECTOMY      There were no vitals filed for this visit.   Subjective Assessment - 01/03/20 1119    Subjective COVID-19 screening performed upon arrival. Patient arrives with 5/10 pain in right shoulder today.    Pertinent History HTN, DM, CHF, CKD, Anterior hip Right    Limitations Lifting;House hold activities    Diagnostic tests x-ray: reports tear in RTC but not surgical candidate.    Patient Stated Goals improve movement    Currently in Pain? Yes    Pain Score 5     Pain Location Shoulder    Pain Orientation Right    Pain Descriptors / Indicators Sore    Pain Type Acute pain    Pain Onset More than a month ago    Pain Frequency Intermittent              [facility] PT Assessment - 01/03/20 0001      Assessment   Medical Diagnosis Right shoulder trap and periscap  spasm, rot cuff syndrome    Referring Provider (PT) Grier Mitts, PA-C    Hand Dominance Right    Next MD Visit 01/12/2020    Prior Therapy no      Precautions   Precautions Fall      Restrictions   Weight Bearing Restrictions No                         [facility] Adult PT Treatment/Exercise - 01/03/20 0001      Exercises   Exercises Shoulder      Shoulder Exercises: Supine   Protraction AAROM;Both;20 reps    External Rotation AAROM;Right;20 reps    Flexion AAROM;Both;20 reps      Shoulder Exercises: Seated   Flexion AAROM;Both;20 reps    Flexion Limitations hands clasped together      Shoulder Exercises: Pulleys   Flexion 5 minutes      Shoulder Exercises: ROM/Strengthening   Ranger seated ranger into flexion, CW and CCW circles x2 mins each      Shoulder Exercises: IT sales professional 3 reps;20 seconds      Modalities   Modalities Psychologist, educational  Location R UT and posterior shoulder    Electrical Stimulation Action Pre-mod    Electrical Stimulation Parameters 80-150 hz x10 mins    Electrical Stimulation Goals Pain      Vasopneumatic   Number Minutes Vasopneumatic  10 minutes    Vasopnuematic Location  Shoulder    Vasopneumatic Pressure Low    Vasopneumatic Temperature  40      Manual Therapy   Manual Therapy --                       PT Long Term Goals - 12/28/19 2148      PT LONG TERM GOAL #1   Title Patient will be independent with HEP and its progression.    Time 6    Period Weeks    Status New      PT LONG TERM GOAL #2   Title Patient will demonstrate 145+ degrees of right shoulder flexion to improve overhead activities.    Time 6    Period Weeks    Status New      PT LONG TERM GOAL #3   Title Patient will demonstrate 60+ degrees of right shoulder ER AROM to improve donning and doffing apparel.    Time 6    Period Weeks    Status New       PT LONG TERM GOAL #4   Title Patent will report ability to perform ADLs independently with right shoulder pain less than or equal to 2/10.    Time 6    Period Weeks    Status New                 Plan - 01/03/20 1130    Clinical Impression Statement Patient responded fairly well to therapy session despite 5/10 right shoulder pain. Patient guided through TEs and patient demonstrated good form and techique after cuing. Patient and PT reviewed HEP to which she was performing AAROM shoulder ER with more elbow extension than ER. Tactile and verbal cuing provided for proper technique. New HEP established to which she reported understanding. No adverse effects upon removal of modalities.    Personal Factors and Comorbidities Age;Comorbidity 2    Comorbidities HTN, DM, CHF, CKD, right Anterior THA    Examination-Activity Limitations Sleep;Lift;Reach Overhead    Examination-Participation Restrictions Cleaning    Stability/Clinical Decision Making Stable/Uncomplicated    Clinical Decision Making Low    Rehab Potential Fair    PT Frequency 1x / week    PT Duration 6 weeks    PT Treatment/Interventions ADLs/Self Care Home Management;Cryotherapy;Electrical Stimulation;Moist Heat;Ultrasound;Therapeutic activities;Therapeutic exercise;Balance training;Neuromuscular re-education;Manual techniques;Passive range of motion;Patient/family education;Vasopneumatic Device    PT Next Visit Plan AAROM to right shoulder PROM, provide updated HEP; modalties as needed.    PT Home Exercise Plan see patient education section    Consulted and Agree with Plan of Care Patient           Patient will benefit from skilled therapeutic intervention in order to improve the following deficits and impairments:  Decreased activity tolerance, Decreased strength, Difficulty walking, Impaired UE functional use, Pain, Postural dysfunction  Visit Diagnosis: Acute pain of right shoulder  Muscle weakness  (generalized)     Problem List Patient Active Problem List   Diagnosis Date Noted  . Acute on chronic renal failure ([place]) 03/17/2019  . Hypothyroidism 03/17/2019  . Acute CHF (congestive heart failure) ([place]) 03/17/2019  . Essential hypertension 01/06/2017  . Dyspnea on exertion 01/06/2017  .  Hyperlipidemia 01/06/2017  . Closed displaced fracture of right femoral neck ([place]) 11/03/2016  . Diabetes mellitus ([place]) 11/03/2016  . Hyponatremia 11/03/2016  . Leukocytosis 11/03/2016  . Normocytic anemia 11/03/2016  . CKD (chronic kidney disease), stage IV ([place]) 11/03/2016    Gabriela Eves, PT, DPT 01/03/2020, 12:09 PM  [facility] [address] [place], [place], [address] Phone: [phone]   Fax:  [phone]  Name: Abigail Huang MRN: [id number] Date of Birth: January 26, 1931

## 2020-01-10 ENCOUNTER — Other Ambulatory Visit: Payer: Self-pay

## 2020-01-10 ENCOUNTER — Encounter: Payer: Self-pay | Admitting: Physical Therapy

## 2020-01-10 ENCOUNTER — Ambulatory Visit: Payer: Medicare HMO | Admitting: Physical Therapy

## 2020-01-10 DIAGNOSIS — M6281 Muscle weakness (generalized): Secondary | ICD-10-CM | POA: Diagnosis not present

## 2020-01-10 DIAGNOSIS — M25511 Pain in right shoulder: Secondary | ICD-10-CM

## 2020-01-10 NOTE — Therapy (Signed)
[facility] [place], [place], [address] Phone: [phone]   Fax:  [phone]  Physical Therapy Treatment  Patient Details  Name: Abigail Huang MRN: [id number] Date of Birth: August 04, 1930 Referring Provider (PT): Grier Mitts, PA-C   Encounter Date: 01/10/2020   PT End of Session - 01/10/20 1307    Visit Number 3    Number of Visits 6    Date for PT Re-Evaluation 02/15/20    Authorization Type Progress note every 10th visit    PT Start Time 1300    Activity Tolerance Patient tolerated treatment well    Behavior During Therapy [facility] for tasks assessed/performed           Past Medical History:  Diagnosis Date   Diabetes mellitus    Hyperlipidemia    Hypertension     Past Surgical History:  Procedure Laterality Date   ABDOMINAL HYSTERECTOMY     ANTERIOR APPROACH HEMI HIP ARTHROPLASTY Right 11/04/2016   Procedure: ANTERIOR APPROACH HEMI HIP ARTHROPLASTY;  Surgeon: Dorna Leitz, MD;  Location: [place];  Service: Orthopedics;  Laterality: Right;   COLONOSCOPY     POLYPECTOMY      There were no vitals filed for this visit.   Subjective Assessment - 01/10/20 1125    Subjective COVID-19 screening performed upon arrival. Patient reports feeling better but hasn't been consistent with HEP.    Pertinent History HTN, DM, CHF, CKD, Anterior hip Right    Limitations Lifting;House hold activities    Diagnostic tests x-ray: reports tear in RTC but not surgical candidate.    Patient Stated Goals improve movement    Currently in Pain? Yes    Pain Score 2     Pain Location Shoulder    Pain Orientation Right    Pain Descriptors / Indicators Sore    Pain Type Acute pain    Pain Onset More than a month ago    Pain Frequency Intermittent              [facility] PT Assessment - 01/10/20 0001      Assessment   Medical Diagnosis Right shoulder trap and periscap spasm, rot cuff syndrome    Referring Provider  (PT) Danielle laliberte, PA-C    Hand Dominance Right    Next MD Visit 01/12/2020    Prior Therapy no      Precautions   Precautions Fall      Restrictions   Weight Bearing Restrictions No                         [facility] Adult PT Treatment/Exercise - 01/10/20 0001      Shoulder Exercises: Standing   Protraction Right;15 reps    Theraband Level (Shoulder Protraction) Level 1 (Yellow)    External Rotation 15 reps;AAROM;Right    Theraband Level (Shoulder External Rotation) Level 1 (Yellow)    External Rotation Limitations attempted with yellow theraband required Assistance    Internal Rotation Strengthening;Right;15 reps    Theraband Level (Shoulder Internal Rotation) Level 1 (Yellow)    Row Strengthening;Right;15 reps    Theraband Level (Shoulder Row) Level 1 (Yellow)    Other Standing Exercises standing hitch hiker x15 right      Shoulder Exercises: Pulleys   Flexion 5 minutes      Shoulder Exercises: ROM/Strengthening   UBE (Upper Arm Bike) 120 RPM x8 mins (4 mins fwd & bwd)    Ranger seated ranger into flexion, CW and  CCW circles x2 mins each      Shoulder Exercises: IT sales professional 3 reps;30 seconds      Modalities   Modalities Psychologist, educational Location R UT and posterior shoulder    Electrical Stimulation Action pre-mod    Electrical Stimulation Parameters 80-150 hz x10 mins    Electrical Stimulation Goals Pain      Vasopneumatic   Number Minutes Vasopneumatic  10 minutes    Vasopnuematic Location  Shoulder    Vasopneumatic Pressure Low    Vasopneumatic Temperature  40                       PT Long Term Goals - 12/28/19 2148      PT LONG TERM GOAL #1   Title Patient will be independent with HEP and its progression.    Time 6    Period Weeks    Status New      PT LONG TERM GOAL #2   Title Patient will demonstrate 145+ degrees of right shoulder  flexion to improve overhead activities.    Time 6    Period Weeks    Status New      PT LONG TERM GOAL #3   Title Patient will demonstrate 60+ degrees of right shoulder ER AROM to improve donning and doffing apparel.    Time 6    Period Weeks    Status New      PT LONG TERM GOAL #4   Title Patent will report ability to perform ADLs independently with right shoulder pain less than or equal to 2/10.    Time 6    Period Weeks    Status New                 Plan - 01/10/20 1258    Clinical Impression Statement Patient was able to complete treatment but with constant verbal and tactile cuing for form and technique. Patient was able to progress TEs with slight compensatory motions but overall did fairly well. HEP provided for table circles and table flexion. Normal response to modalities upon removal.    Personal Factors and Comorbidities Age;Comorbidity 2    Examination-Activity Limitations Sleep;Lift;Reach Overhead    Examination-Participation Restrictions Cleaning    Stability/Clinical Decision Making Stable/Uncomplicated    Clinical Decision Making Low    Rehab Potential Fair    PT Frequency 1x / week    PT Duration 6 weeks    PT Treatment/Interventions ADLs/Self Care Home Management;Cryotherapy;Electrical Stimulation;Moist Heat;Ultrasound;Therapeutic activities;Therapeutic exercise;Balance training;Neuromuscular re-education;Manual techniques;Passive range of motion;Patient/family education;Vasopneumatic Device    PT Next Visit Plan AAROM to right shoulder PROM, provide updated HEP; modalties as needed.    PT Home Exercise Plan see patient education section    Consulted and Agree with Plan of Care Patient           Patient will benefit from skilled therapeutic intervention in order to improve the following deficits and impairments:  Decreased activity tolerance, Decreased strength, Difficulty walking, Impaired UE functional use, Pain, Postural dysfunction  Visit  Diagnosis: Acute pain of right shoulder  Muscle weakness (generalized)     Problem List Patient Active Problem List   Diagnosis Date Noted   Acute on chronic renal failure ([place]) 03/17/2019   Hypothyroidism 03/17/2019   Acute CHF (congestive heart failure) ([place]) 03/17/2019   Essential hypertension 01/06/2017   Dyspnea on exertion 01/06/2017   Hyperlipidemia 01/06/2017  Closed displaced fracture of right femoral neck ([place]) 11/03/2016   Diabetes mellitus ([place]) 11/03/2016   Hyponatremia 11/03/2016   Leukocytosis 11/03/2016   Normocytic anemia 11/03/2016   CKD (chronic kidney disease), stage IV ([place]) 11/03/2016    Gabriela Eves, PT, DPT 01/10/2020, 1:07 PM  [facility] [address] [place], [place], [address] Phone: [phone]   Fax:  [phone]  Name: Abigail Huang MRN: [id number] Date of Birth: 21-Dec-1930

## 2020-01-15 ENCOUNTER — Other Ambulatory Visit: Payer: Self-pay

## 2020-01-15 ENCOUNTER — Encounter (HOSPITAL_BASED_OUTPATIENT_CLINIC_OR_DEPARTMENT_OTHER): Payer: Self-pay

## 2020-01-15 ENCOUNTER — Emergency Department (HOSPITAL_BASED_OUTPATIENT_CLINIC_OR_DEPARTMENT_OTHER)
Admission: EM | Admit: 2020-01-15 | Discharge: 2020-01-15 | Disposition: A | Discharge status: Home / Self Care | Payer: Medicare HMO | Attending: Emergency Medicine | Admitting: Emergency Medicine

## 2020-01-15 DIAGNOSIS — Z5321 Procedure and treatment not carried out due to patient leaving prior to being seen by health care provider: Secondary | ICD-10-CM | POA: Insufficient documentation

## 2020-01-15 DIAGNOSIS — M79652 Pain in left thigh: Secondary | ICD-10-CM | POA: Diagnosis not present

## 2020-01-15 DIAGNOSIS — W19XXXA Unspecified fall, initial encounter: Secondary | ICD-10-CM | POA: Insufficient documentation

## 2020-01-15 NOTE — ED Triage Notes (Addendum)
Pt c/o pain to left thigh after a fall x 5-6 months-denies recent injury-denies pain at present unless area is touched-NAD-steady gait

## 2020-01-17 ENCOUNTER — Encounter: Payer: Self-pay | Admitting: Physical Therapy

## 2020-01-17 ENCOUNTER — Ambulatory Visit: Payer: Medicare HMO | Attending: Surgical | Admitting: Physical Therapy

## 2020-01-17 ENCOUNTER — Other Ambulatory Visit: Payer: Self-pay

## 2020-01-17 DIAGNOSIS — M6281 Muscle weakness (generalized): Secondary | ICD-10-CM | POA: Diagnosis not present

## 2020-01-17 DIAGNOSIS — M25511 Pain in right shoulder: Secondary | ICD-10-CM

## 2020-01-17 NOTE — Therapy (Signed)
[facility] [place], [place], [address] Phone: [phone]   Fax:  [phone]  Physical Therapy Treatment  Patient Details  Name: Abigail Huang MRN: [id number] Date of Birth: 1931/01/14 Referring Provider (PT): Grier Mitts, PA-C   Encounter Date: 01/17/2020   PT End of Session - 01/17/20 1155    Visit Number 4    Number of Visits 6    Date for PT Re-Evaluation 02/15/20    Authorization Type Progress note every 10th visit    PT Start Time 1115    PT Stop Time 1206    PT Time Calculation (min) 51 min    Activity Tolerance Patient tolerated treatment well    Behavior During Therapy [facility] for tasks assessed/performed           Past Medical History:  Diagnosis Date  . Diabetes mellitus   . Hyperlipidemia   . Hypertension     Past Surgical History:  Procedure Laterality Date  . ABDOMINAL HYSTERECTOMY    . ANTERIOR APPROACH HEMI HIP ARTHROPLASTY Right 11/04/2016   Procedure: ANTERIOR APPROACH HEMI HIP ARTHROPLASTY;  Surgeon: Dorna Leitz, MD;  Location: [place];  Service: Orthopedics;  Laterality: Right;  . COLONOSCOPY    . POLYPECTOMY      There were no vitals filed for this visit.   Subjective Assessment - 01/17/20 1118    Subjective COVID-19 screening performed upon arrival. Patient reports more pain over the week.    Pertinent History HTN, DM, CHF, CKD, Anterior hip Right    Limitations Lifting;House hold activities    Diagnostic tests x-ray: reports tear in RTC but not surgical candidate.    Patient Stated Goals improve movement    Currently in Pain? Yes   did not provide number on pain scale             [facility] PT Assessment - 01/17/20 0001      Assessment   Medical Diagnosis Right shoulder trap and periscap spasm, rot cuff syndrome    Referring Provider (PT) Danielle laliberte, PA-C    Hand Dominance Right    Next MD Visit 01/12/2020    Prior Therapy no      Precautions   Precautions  Fall      Restrictions   Weight Bearing Restrictions No      AROM   Right Shoulder Flexion 165 Degrees    Right Shoulder ABduction 140 Degrees    Right Shoulder Internal Rotation 70 Degrees    Right Shoulder External Rotation 65 Degrees                         [facility] Adult PT Treatment/Exercise - 01/17/20 0001      Exercises   Exercises Shoulder      Shoulder Exercises: Standing   Protraction Right;20 reps    Theraband Level (Shoulder Protraction) Level 1 (Yellow)    External Rotation AAROM;20 reps    Internal Rotation Strengthening;Right;20 reps    Theraband Level (Shoulder Internal Rotation) Level 1 (Yellow)    Row Strengthening;Right;20 reps    Theraband Level (Shoulder Row) Level 1 (Yellow)      Shoulder Exercises: Pulleys   Flexion 5 minutes      Shoulder Exercises: ROM/Strengthening   UBE (Upper Arm Bike) 90 RPM x8 mins (4 mins fwd & bwd)    Other ROM/Strengthening Exercises wall ladder x3 mins level 28      Modalities   Modalities Electrical Stimulation;Vasopneumatic  Acupuncturist Location R UT and posterior shoulder    Electrical Stimulation Action pre-mod    Electrical Stimulation Parameters 80-150 hz x10 mins    Electrical Stimulation Goals Pain      Vasopneumatic   Number Minutes Vasopneumatic  10 minutes    Vasopnuematic Location  Shoulder    Vasopneumatic Pressure Low    Vasopneumatic Temperature  34 for pain/edema                       PT Long Term Goals - 01/17/20 1215      PT LONG TERM GOAL #1   Title Patient will be independent with HEP and its progression.    Time 6    Period Weeks    Status Achieved      PT LONG TERM GOAL #2   Title Patient will demonstrate 145+ degrees of right shoulder flexion to improve overhead activities.    Time 6    Period Weeks    Status Achieved      PT LONG TERM GOAL #3   Title Patient will demonstrate 60+ degrees of right shoulder ER AROM to  improve donning and doffing apparel.    Time 6    Period Weeks    Status Achieved      PT LONG TERM GOAL #4   Title Patent will report ability to perform ADLs independently with right shoulder pain less than or equal to 2/10.    Time 6    Period Weeks    Status On-going                 Plan - 01/17/20 1208    Clinical Impression Statement Patient responded well to therapy session but with onoing compensatory motions with UT and abduction. Patient demonstrated improved form with verbal and tactile cuing. Patient was able to tolerate an increase of reps with no complaints of pain. Normal response to modalities upon removal.    Personal Factors and Comorbidities Age;Comorbidity 2    Comorbidities HTN, DM, CHF, CKD, right Anterior THA    Examination-Activity Limitations Sleep;Lift;Reach Overhead    Examination-Participation Restrictions Cleaning    Stability/Clinical Decision Making Stable/Uncomplicated    Clinical Decision Making Low    Rehab Potential Fair    PT Frequency 1x / week    PT Duration 6 weeks    PT Treatment/Interventions ADLs/Self Care Home Management;Cryotherapy;Electrical Stimulation;Moist Heat;Ultrasound;Therapeutic activities;Therapeutic exercise;Balance training;Neuromuscular re-education;Manual techniques;Passive range of motion;Patient/family education;Vasopneumatic Device    PT Next Visit Plan AAROM to right shoulder PROM, provide updated HEP; modalties as needed.    PT Home Exercise Plan see patient education section    Consulted and Agree with Plan of Care Patient           Patient will benefit from skilled therapeutic intervention in order to improve the following deficits and impairments:  Decreased activity tolerance, Decreased strength, Difficulty walking, Impaired UE functional use, Pain, Postural dysfunction  Visit Diagnosis: Acute pain of right shoulder  Muscle weakness (generalized)     Problem List Patient Active Problem List    Diagnosis Date Noted  . Acute on chronic renal failure ([place]) 03/17/2019  . Hypothyroidism 03/17/2019  . Acute CHF (congestive heart failure) ([place]) 03/17/2019  . Essential hypertension 01/06/2017  . Dyspnea on exertion 01/06/2017  . Hyperlipidemia 01/06/2017  . Closed displaced fracture of right femoral neck ([place]) 11/03/2016  . Diabetes mellitus ([place]) 11/03/2016  . Hyponatremia 11/03/2016  . Leukocytosis 11/03/2016  .  Normocytic anemia 11/03/2016  . CKD (chronic kidney disease), stage IV ([place]) 11/03/2016    Gabriela Eves, PT, DPT 01/17/2020, 12:16 PM  [facility] [address] [place], [place], [address] Phone: [phone]   Fax:  [phone]  Name: Hayzlee Mcsorley MRN: [id number] Date of Birth: [birth year]

## 2020-01-19 ENCOUNTER — Other Ambulatory Visit: Payer: Self-pay | Admitting: Internal Medicine

## 2020-01-19 DIAGNOSIS — Z1231 Encounter for screening mammogram for malignant neoplasm of breast: Secondary | ICD-10-CM

## 2020-01-24 ENCOUNTER — Encounter: Payer: Self-pay | Admitting: Physical Therapy

## 2020-01-24 ENCOUNTER — Other Ambulatory Visit: Payer: Self-pay

## 2020-01-24 ENCOUNTER — Ambulatory Visit: Payer: Medicare HMO | Admitting: Physical Therapy

## 2020-01-24 DIAGNOSIS — M25511 Pain in right shoulder: Secondary | ICD-10-CM | POA: Diagnosis not present

## 2020-01-24 DIAGNOSIS — M6281 Muscle weakness (generalized): Secondary | ICD-10-CM | POA: Diagnosis not present

## 2020-01-24 NOTE — Therapy (Signed)
[facility] [place], [place], [address] Phone: [phone]   Fax:  [phone]  Physical Therapy Treatment  Patient Details  Name: Abigail Huang MRN: [id number] Date of Birth: [birth year] Referring Provider (PT): Grier Mitts, PA-C   Encounter Date: 01/24/2020   PT End of Session - 01/24/20 1147    Visit Number 5    Number of Visits 6    Date for PT Re-Evaluation 02/15/20    Authorization Type Progress note every 10th visit    PT Start Time 1115    PT Stop Time 1200    PT Time Calculation (min) 45 min    Activity Tolerance Patient tolerated treatment well    Behavior During Therapy [facility] for tasks assessed/performed           Past Medical History:  Diagnosis Date  . Diabetes mellitus   . Hyperlipidemia   . Hypertension     Past Surgical History:  Procedure Laterality Date  . ABDOMINAL HYSTERECTOMY    . ANTERIOR APPROACH HEMI HIP ARTHROPLASTY Right 11/04/2016   Procedure: ANTERIOR APPROACH HEMI HIP ARTHROPLASTY;  Surgeon: Dorna Leitz, MD;  Location: [facility];  Service: Orthopedics;  Laterality: Right;  . COLONOSCOPY    . POLYPECTOMY      There were no vitals filed for this visit.   Subjective Assessment - 01/24/20 1136    Subjective COVID-19 screening performed upon arrival. Patient reports doing well, just arm gets tired during home activities.    Pertinent History HTN, DM, CHF, CKD, Anterior hip Right    Limitations Lifting;House hold activities    Diagnostic tests x-ray: reports tear in RTC but not surgical candidate.    Patient Stated Goals improve movement    Currently in Pain? No/denies              [facility] PT Assessment - 01/24/20 0001      Assessment   Medical Diagnosis Right shoulder trap and periscap spasm, rot cuff syndrome    Referring Provider (PT) Danielle laliberte, PA-C    Hand Dominance Right    Next MD Visit none    Prior Therapy no      Precautions   Precautions Fall       Restrictions   Weight Bearing Restrictions No                         [facility] Adult PT Treatment/Exercise - 01/24/20 0001      Exercises   Exercises Shoulder      Shoulder Exercises: Standing   Protraction Right;20 reps    Theraband Level (Shoulder Protraction) Level 1 (Yellow)    External Rotation AAROM;20 reps    Internal Rotation Strengthening;Right;20 reps    Theraband Level (Shoulder Internal Rotation) Level 1 (Yellow)    Flexion AROM;Right;20 reps    Flexion Limitations jar to middle shelf 2x10    Row Strengthening;Right;20 reps    Theraband Level (Shoulder Row) Level 1 (Yellow)      Shoulder Exercises: Pulleys   Flexion 5 minutes      Shoulder Exercises: ROM/Strengthening   UBE (Upper Arm Bike) 90 RPM x8 mins (4 mins fwd & bwd)    Wall Wash flexion, CW, and CCW circles x20      Modalities   Modalities Electrical Stimulation;Vasopneumatic      Acupuncturist Location R UT and posterior shoulder    Electrical Stimulation Action pre-mod    Electrical Stimulation  Parameters 80-150 hz x10 mins    Electrical Stimulation Goals Pain      Vasopneumatic   Number Minutes Vasopneumatic  10 minutes    Vasopnuematic Location  Shoulder    Vasopneumatic Pressure Low    Vasopneumatic Temperature  34 for pain/edema                       PT Long Term Goals - 01/24/20 1156      PT LONG TERM GOAL #1   Title Patient will be independent with HEP and its progression.    Time 6    Period Weeks    Status Achieved      PT LONG TERM GOAL #2   Title Patient will demonstrate 145+ degrees of right shoulder flexion to improve overhead activities.    Time 6    Period Weeks    Status Achieved      PT LONG TERM GOAL #3   Title Patient will demonstrate 60+ degrees of right shoulder ER AROM to improve donning and doffing apparel.    Time 6    Period Weeks    Status Achieved      PT LONG TERM GOAL #4   Title Patent  will report ability to perform ADLs independently with right shoulder pain less than or equal to 2/10.    Time 6    Period Weeks    Status Achieved                 Plan - 01/24/20 1147    Clinical Impression Statement Patient was able to complete session but with reports of soreness and fatigue. Patient's fair with carryover of cuing to prevent compensatory motions. Patient reports improved functional status with minimal pain during ADLs; patient is ready for DC next visit. No adverse effects upon removal of modalities.    Personal Factors and Comorbidities Age;Comorbidity 2    Comorbidities HTN, DM, CHF, CKD, right Anterior THA    Examination-Activity Limitations Sleep;Lift;Reach Overhead    Examination-Participation Restrictions Cleaning    Stability/Clinical Decision Making Stable/Uncomplicated    Clinical Decision Making Low    Rehab Potential Fair    PT Frequency 1x / week    PT Duration 6 weeks    PT Treatment/Interventions ADLs/Self Care Home Management;Cryotherapy;Electrical Stimulation;Moist Heat;Ultrasound;Therapeutic activities;Therapeutic exercise;Balance training;Neuromuscular re-education;Manual techniques;Passive range of motion;Patient/family education;Vasopneumatic Device    PT Next Visit Plan AAROM to right shoulder PROM, provide updated HEP; modalties as needed.    PT Home Exercise Plan see patient education section    Consulted and Agree with Plan of Care Patient           Patient will benefit from skilled therapeutic intervention in order to improve the following deficits and impairments:  Decreased activity tolerance, Decreased strength, Difficulty walking, Impaired UE functional use, Pain, Postural dysfunction  Visit Diagnosis: Acute pain of right shoulder  Muscle weakness (generalized)     Problem List Patient Active Problem List   Diagnosis Date Noted  . Acute on chronic renal failure ([place]) 03/17/2019  . Hypothyroidism 03/17/2019  . Acute CHF  (congestive heart failure) ([place]) 03/17/2019  . Essential hypertension 01/06/2017  . Dyspnea on exertion 01/06/2017  . Hyperlipidemia 01/06/2017  . Closed displaced fracture of right femoral neck ([place]) 11/03/2016  . Diabetes mellitus ([place]) 11/03/2016  . Hyponatremia 11/03/2016  . Leukocytosis 11/03/2016  . Normocytic anemia 11/03/2016  . CKD (chronic kidney disease), stage IV ([place]) 11/03/2016    Gabriela Eves, PT,  DPT 01/24/2020, 12:00 PM  [facility] [place], [place], [address] Phone: [phone]   Fax:  [phone]  Name: Abigail Huang MRN: [id number] Date of Birth: 04-11-1931

## 2020-01-31 ENCOUNTER — Other Ambulatory Visit: Payer: Self-pay

## 2020-01-31 ENCOUNTER — Encounter: Payer: Self-pay | Admitting: Physical Therapy

## 2020-01-31 ENCOUNTER — Ambulatory Visit: Payer: Medicare HMO | Admitting: Physical Therapy

## 2020-01-31 DIAGNOSIS — M25511 Pain in right shoulder: Secondary | ICD-10-CM

## 2020-01-31 DIAGNOSIS — M6281 Muscle weakness (generalized): Secondary | ICD-10-CM | POA: Diagnosis not present

## 2020-01-31 NOTE — Therapy (Signed)
[facility] [place], [place], [address] Phone: [phone]   Fax:  [phone]  Physical Therapy Treatment PHYSICAL THERAPY DISCHARGE SUMMARY  Visits from Start of Care: 6  Current functional level related to goals / functional outcomes: See below   Remaining deficits: See goals   Education / Equipment: HEP Plan: Patient agrees to discharge.  Patient goals were met. Patient is being discharged due to meeting the stated rehab goals.  ?????    Gabriela Eves, PT, DPT  Patient Details  Name: Abigail Huang MRN: [id number] Date of Birth: 11/22/1930 Referring Provider (PT): Grier Mitts, PA-C   Encounter Date: 01/31/2020   PT End of Session - 01/31/20 1118    Visit Number 6    Number of Visits 6    Date for PT Re-Evaluation 02/15/20    Authorization Type Progress note every 10th visit    PT Start Time 1117    PT Stop Time 1156    PT Time Calculation (min) 39 min    Activity Tolerance Patient tolerated treatment well    Behavior During Therapy [facility] for tasks assessed/performed           Past Medical History:  Diagnosis Date  . Diabetes mellitus   . Hyperlipidemia   . Hypertension     Past Surgical History:  Procedure Laterality Date  . ABDOMINAL HYSTERECTOMY    . ANTERIOR APPROACH HEMI HIP ARTHROPLASTY Right 11/04/2016   Procedure: ANTERIOR APPROACH HEMI HIP ARTHROPLASTY;  Surgeon: Dorna Leitz, MD;  Location: [facility];  Service: Orthopedics;  Laterality: Right;  . COLONOSCOPY    . POLYPECTOMY      There were no vitals filed for this visit.   Subjective Assessment - 01/31/20 1112    Subjective COVID-19 screening performed upon arrival. No new complaints.    Pertinent History HTN, DM, CHF, CKD, Anterior hip Right    Limitations Lifting;House hold activities    Diagnostic tests x-ray: reports tear in RTC but not surgical candidate.    Patient Stated Goals improve movement    Currently in  Pain? No/denies              [facility] PT Assessment - 01/31/20 0001      Assessment   Medical Diagnosis Right shoulder trap and periscap spasm, rot cuff syndrome    Referring Provider (PT) Danielle laliberte, PA-C    Hand Dominance Right    Next MD Visit none    Prior Therapy no      Precautions   Precautions Fall      Restrictions   Weight Bearing Restrictions No                         [facility] Adult PT Treatment/Exercise - 01/31/20 0001      Shoulder Exercises: Standing   Protraction Strengthening;Right;20 reps;Theraband    Theraband Level (Shoulder Protraction) Level 1 (Yellow)    External Rotation AROM;Both;20 reps    Internal Rotation Strengthening;Right;20 reps    Theraband Level (Shoulder Internal Rotation) Level 1 (Yellow)    Flexion Strengthening;Right;20 reps;Weights    Shoulder Flexion Weight (lbs) 1    Flexion Limitations jar with cones x10 reps to bottom shelf, x5 reps to mid cabinet    Extension Strengthening;Right;20 reps;Theraband    Theraband Level (Shoulder Extension) Level 1 (Yellow)    Row Strengthening;Right;20 reps    Theraband Level (Shoulder Row) Level 1 (Yellow)      Shoulder Exercises: Pulleys  Flexion 5 minutes      Shoulder Exercises: ROM/Strengthening   UBE (Upper Arm Bike) 90 RPM x8 mins (4 mins fwd & bwd)    Wall Wash CW and CCW circles x fatigue each    Wall Pushups 20 reps      Modalities   Modalities Vasopneumatic      Vasopneumatic   Number Minutes Vasopneumatic  10 minutes    Vasopnuematic Location  Shoulder    Vasopneumatic Pressure Low    Vasopneumatic Temperature  34 for post therex discomfort/fatigue                       PT Long Term Goals - 01/24/20 1156      PT LONG TERM GOAL #1   Title Patient will be independent with HEP and its progression.    Time 6    Period Weeks    Status Achieved      PT LONG TERM GOAL #2   Title Patient will demonstrate 145+ degrees of right shoulder flexion  to improve overhead activities.    Time 6    Period Weeks    Status Achieved      PT LONG TERM GOAL #3   Title Patient will demonstrate 60+ degrees of right shoulder ER AROM to improve donning and doffing apparel.    Time 6    Period Weeks    Status Achieved      PT LONG TERM GOAL #4   Title Patent will report ability to perform ADLs independently with right shoulder pain less than or equal to 2/10.    Time 6    Period Weeks    Status Achieved                 Plan - 01/31/20 1148    Clinical Impression Statement Patient presented in clinic with reports of greater independence and less limitations from R shoulder. Patient does report RUE fatigue with prolonged activity. Patient able to demonstrate greater compensatory strategies such as shoulder elevation and trunk lean when fatigued. Patient still limited with any resisted ER but able to complete half range of active ER. Patient denies any pain with ADLs around her home at this time. Patient denied any questions or concerns regarding HEP or home enviroment. Normal vasopneumatic response noted following removal of the modality.    Personal Factors and Comorbidities Age;Comorbidity 2    Comorbidities HTN, DM, CHF, CKD, right Anterior THA    Examination-Activity Limitations Sleep;Lift;Reach Overhead    Examination-Participation Restrictions Cleaning    Stability/Clinical Decision Making Stable/Uncomplicated    Rehab Potential Fair    PT Frequency 1x / week    PT Duration 6 weeks    PT Treatment/Interventions ADLs/Self Care Home Management;Cryotherapy;Electrical Stimulation;Moist Heat;Ultrasound;Therapeutic activities;Therapeutic exercise;Balance training;Neuromuscular re-education;Manual techniques;Passive range of motion;Patient/family education;Vasopneumatic Device    PT Next Visit Plan D/C summary required.    PT Home Exercise Plan see patient education section    Consulted and Agree with Plan of Care Patient            Patient will benefit from skilled therapeutic intervention in order to improve the following deficits and impairments:  Decreased activity tolerance, Decreased strength, Difficulty walking, Impaired UE functional use, Pain, Postural dysfunction  Visit Diagnosis: Acute pain of right shoulder  Muscle weakness (generalized)     Problem List Patient Active Problem List   Diagnosis Date Noted  . Acute on chronic renal failure ([place]) 03/17/2019  . Hypothyroidism 03/17/2019  .  Acute CHF (congestive heart failure) ([place]) 03/17/2019  . Essential hypertension 01/06/2017  . Dyspnea on exertion 01/06/2017  . Hyperlipidemia 01/06/2017  . Closed displaced fracture of right femoral neck ([place]) 11/03/2016  . Diabetes mellitus ([place]) 11/03/2016  . Hyponatremia 11/03/2016  . Leukocytosis 11/03/2016  . Normocytic anemia 11/03/2016  . CKD (chronic kidney disease), stage IV ([place]) 11/03/2016    Standley Brooking, PTA 01/31/20 11:57 AM   [facility] [address] [place], [place], [address] Phone: [phone]   Fax:  [phone]  Name: Abigail Huang MRN: [id number] Date of Birth: [birth year]

## 2020-02-01 ENCOUNTER — Ambulatory Visit
Admission: RE | Admit: 2020-02-01 | Discharge: 2020-02-01 | Disposition: A | Discharge status: Home / Self Care | Payer: Medicare HMO | Source: Ambulatory Visit | Attending: Internal Medicine | Admitting: Internal Medicine

## 2020-02-01 DIAGNOSIS — Z1231 Encounter for screening mammogram for malignant neoplasm of breast: Secondary | ICD-10-CM

## 2020-03-13 DIAGNOSIS — E1129 Type 2 diabetes mellitus with other diabetic kidney complication: Secondary | ICD-10-CM | POA: Diagnosis not present

## 2020-03-13 DIAGNOSIS — I10 Essential (primary) hypertension: Secondary | ICD-10-CM | POA: Diagnosis not present

## 2020-03-13 DIAGNOSIS — E039 Hypothyroidism, unspecified: Secondary | ICD-10-CM | POA: Diagnosis not present

## 2020-03-13 DIAGNOSIS — E785 Hyperlipidemia, unspecified: Secondary | ICD-10-CM | POA: Diagnosis not present

## 2020-03-21 DIAGNOSIS — R82998 Other abnormal findings in urine: Secondary | ICD-10-CM | POA: Diagnosis not present

## 2020-03-21 DIAGNOSIS — Z23 Encounter for immunization: Secondary | ICD-10-CM | POA: Diagnosis not present

## 2020-03-21 DIAGNOSIS — I10 Essential (primary) hypertension: Secondary | ICD-10-CM | POA: Diagnosis not present

## 2020-03-21 DIAGNOSIS — Z0000 Encounter for general adult medical examination without abnormal findings: Secondary | ICD-10-CM | POA: Diagnosis not present

## 2020-03-21 DIAGNOSIS — I130 Hypertensive heart and chronic kidney disease with heart failure and stage 1 through stage 4 chronic kidney disease, or unspecified chronic kidney disease: Secondary | ICD-10-CM | POA: Diagnosis not present

## 2020-03-21 DIAGNOSIS — N184 Chronic kidney disease, stage 4 (severe): Secondary | ICD-10-CM | POA: Diagnosis not present

## 2020-03-21 DIAGNOSIS — R609 Edema, unspecified: Secondary | ICD-10-CM | POA: Diagnosis not present

## 2020-03-21 DIAGNOSIS — E039 Hypothyroidism, unspecified: Secondary | ICD-10-CM | POA: Diagnosis not present

## 2020-03-21 DIAGNOSIS — I5032 Chronic diastolic (congestive) heart failure: Secondary | ICD-10-CM | POA: Diagnosis not present

## 2020-03-21 DIAGNOSIS — M1990 Unspecified osteoarthritis, unspecified site: Secondary | ICD-10-CM | POA: Diagnosis not present

## 2020-03-21 DIAGNOSIS — I872 Venous insufficiency (chronic) (peripheral): Secondary | ICD-10-CM | POA: Diagnosis not present

## 2020-04-04 DIAGNOSIS — Z809 Family history of malignant neoplasm, unspecified: Secondary | ICD-10-CM | POA: Diagnosis not present

## 2020-04-04 DIAGNOSIS — K5900 Constipation, unspecified: Secondary | ICD-10-CM | POA: Diagnosis not present

## 2020-04-04 DIAGNOSIS — R42 Dizziness and giddiness: Secondary | ICD-10-CM | POA: Diagnosis not present

## 2020-04-04 DIAGNOSIS — E1140 Type 2 diabetes mellitus with diabetic neuropathy, unspecified: Secondary | ICD-10-CM | POA: Diagnosis not present

## 2020-04-04 DIAGNOSIS — E785 Hyperlipidemia, unspecified: Secondary | ICD-10-CM | POA: Diagnosis not present

## 2020-04-04 DIAGNOSIS — Z794 Long term (current) use of insulin: Secondary | ICD-10-CM | POA: Diagnosis not present

## 2020-04-04 DIAGNOSIS — E039 Hypothyroidism, unspecified: Secondary | ICD-10-CM | POA: Diagnosis not present

## 2020-04-04 DIAGNOSIS — Z7984 Long term (current) use of oral hypoglycemic drugs: Secondary | ICD-10-CM | POA: Diagnosis not present

## 2020-04-04 DIAGNOSIS — K08109 Complete loss of teeth, unspecified cause, unspecified class: Secondary | ICD-10-CM | POA: Diagnosis not present

## 2020-04-04 DIAGNOSIS — I10 Essential (primary) hypertension: Secondary | ICD-10-CM | POA: Diagnosis not present

## 2020-04-05 DIAGNOSIS — Z1212 Encounter for screening for malignant neoplasm of rectum: Secondary | ICD-10-CM | POA: Diagnosis not present

## 2020-04-26 DIAGNOSIS — H5203 Hypermetropia, bilateral: Secondary | ICD-10-CM | POA: Diagnosis not present

## 2020-04-26 DIAGNOSIS — H26491 Other secondary cataract, right eye: Secondary | ICD-10-CM | POA: Diagnosis not present

## 2020-05-15 DIAGNOSIS — L570 Actinic keratosis: Secondary | ICD-10-CM | POA: Diagnosis not present

## 2020-05-15 DIAGNOSIS — C44329 Squamous cell carcinoma of skin of other parts of face: Secondary | ICD-10-CM | POA: Diagnosis not present

## 2020-05-15 DIAGNOSIS — X32XXXD Exposure to sunlight, subsequent encounter: Secondary | ICD-10-CM | POA: Diagnosis not present

## 2020-06-24 DIAGNOSIS — I872 Venous insufficiency (chronic) (peripheral): Secondary | ICD-10-CM | POA: Diagnosis not present

## 2020-06-24 DIAGNOSIS — E871 Hypo-osmolality and hyponatremia: Secondary | ICD-10-CM | POA: Diagnosis not present

## 2020-06-24 DIAGNOSIS — I5032 Chronic diastolic (congestive) heart failure: Secondary | ICD-10-CM | POA: Diagnosis not present

## 2020-06-24 DIAGNOSIS — N184 Chronic kidney disease, stage 4 (severe): Secondary | ICD-10-CM | POA: Diagnosis not present

## 2020-06-24 DIAGNOSIS — R609 Edema, unspecified: Secondary | ICD-10-CM | POA: Diagnosis not present

## 2020-06-24 DIAGNOSIS — I130 Hypertensive heart and chronic kidney disease with heart failure and stage 1 through stage 4 chronic kidney disease, or unspecified chronic kidney disease: Secondary | ICD-10-CM | POA: Diagnosis not present

## 2020-06-24 DIAGNOSIS — E1129 Type 2 diabetes mellitus with other diabetic kidney complication: Secondary | ICD-10-CM | POA: Diagnosis not present

## 2020-07-12 ENCOUNTER — Emergency Department (HOSPITAL_BASED_OUTPATIENT_CLINIC_OR_DEPARTMENT_OTHER): Payer: Medicare HMO

## 2020-07-12 ENCOUNTER — Other Ambulatory Visit: Payer: Self-pay

## 2020-07-12 ENCOUNTER — Emergency Department (HOSPITAL_BASED_OUTPATIENT_CLINIC_OR_DEPARTMENT_OTHER)
Admission: EM | Admit: 2020-07-12 | Discharge: 2020-07-12 | Disposition: A | Discharge status: Home / Self Care | Payer: Medicare HMO | Attending: Emergency Medicine | Admitting: Emergency Medicine

## 2020-07-12 ENCOUNTER — Encounter (HOSPITAL_BASED_OUTPATIENT_CLINIC_OR_DEPARTMENT_OTHER): Payer: Self-pay

## 2020-07-12 DIAGNOSIS — Z79899 Other long term (current) drug therapy: Secondary | ICD-10-CM | POA: Insufficient documentation

## 2020-07-12 DIAGNOSIS — E1169 Type 2 diabetes mellitus with other specified complication: Secondary | ICD-10-CM | POA: Diagnosis not present

## 2020-07-12 DIAGNOSIS — N184 Chronic kidney disease, stage 4 (severe): Secondary | ICD-10-CM | POA: Insufficient documentation

## 2020-07-12 DIAGNOSIS — R0602 Shortness of breath: Secondary | ICD-10-CM | POA: Diagnosis not present

## 2020-07-12 DIAGNOSIS — Z7984 Long term (current) use of oral hypoglycemic drugs: Secondary | ICD-10-CM | POA: Diagnosis not present

## 2020-07-12 DIAGNOSIS — I130 Hypertensive heart and chronic kidney disease with heart failure and stage 1 through stage 4 chronic kidney disease, or unspecified chronic kidney disease: Secondary | ICD-10-CM | POA: Insufficient documentation

## 2020-07-12 DIAGNOSIS — I5023 Acute on chronic systolic (congestive) heart failure: Secondary | ICD-10-CM | POA: Diagnosis not present

## 2020-07-12 DIAGNOSIS — E785 Hyperlipidemia, unspecified: Secondary | ICD-10-CM | POA: Diagnosis not present

## 2020-07-12 DIAGNOSIS — Z87891 Personal history of nicotine dependence: Secondary | ICD-10-CM | POA: Insufficient documentation

## 2020-07-12 DIAGNOSIS — Z794 Long term (current) use of insulin: Secondary | ICD-10-CM | POA: Insufficient documentation

## 2020-07-12 DIAGNOSIS — I509 Heart failure, unspecified: Secondary | ICD-10-CM

## 2020-07-12 DIAGNOSIS — I110 Hypertensive heart disease with heart failure: Secondary | ICD-10-CM | POA: Diagnosis not present

## 2020-07-12 DIAGNOSIS — E1122 Type 2 diabetes mellitus with diabetic chronic kidney disease: Secondary | ICD-10-CM | POA: Insufficient documentation

## 2020-07-12 DIAGNOSIS — Z96641 Presence of right artificial hip joint: Secondary | ICD-10-CM | POA: Diagnosis not present

## 2020-07-12 HISTORY — DX: Heart failure, unspecified: I50.9

## 2020-07-12 HISTORY — DX: Disorder of kidney and ureter, unspecified: N28.9

## 2020-07-12 LAB — CBC
HCT: 31.4 % — ABNORMAL LOW (ref 36.0–46.0)
Hemoglobin: 10.2 g/dL — ABNORMAL LOW (ref 12.0–15.0)
MCH: 28.6 pg (ref 26.0–34.0)
MCHC: 32.5 g/dL (ref 30.0–36.0)
MCV: 88 fL (ref 80.0–100.0)
Platelets: 273 10*3/uL (ref 150–400)
RBC: 3.57 MIL/uL — ABNORMAL LOW (ref 3.87–5.11)
RDW: 12.9 % (ref 11.5–15.5)
WBC: 8.6 10*3/uL (ref 4.0–10.5)
nRBC: 0 % (ref 0.0–0.2)

## 2020-07-12 LAB — BASIC METABOLIC PANEL
Anion gap: 13 (ref 5–15)
BUN: 67 mg/dL — ABNORMAL HIGH (ref 8–23)
CO2: 20 mmol/L — ABNORMAL LOW (ref 22–32)
Calcium: 9.3 mg/dL (ref 8.9–10.3)
Chloride: 95 mmol/L — ABNORMAL LOW (ref 98–111)
Creatinine, Ser: 3.87 mg/dL — ABNORMAL HIGH (ref 0.44–1.00)
GFR, Estimated: 11 mL/min — ABNORMAL LOW (ref >60)
Glucose, Bld: 295 mg/dL — ABNORMAL HIGH (ref 70–99)
Potassium: 4.3 mmol/L (ref 3.5–5.1)
Sodium: 128 mmol/L — ABNORMAL LOW (ref 135–145)

## 2020-07-12 LAB — TROPONIN I (HIGH SENSITIVITY)
Troponin I (High Sensitivity): 12 ng/L (ref <18)
Troponin I (High Sensitivity): 12 ng/L (ref <18)

## 2020-07-12 LAB — BRAIN NATRIURETIC PEPTIDE: B Natriuretic Peptide: 749.6 pg/mL — ABNORMAL HIGH (ref 0.0–100.0)

## 2020-07-12 MED ORDER — FUROSEMIDE 10 MG/ML IJ SOLN
80.0000 mg | Freq: Once | INTRAMUSCULAR | Status: AC
  Administered 2020-07-12: 80 mg via INTRAVENOUS
  Filled 2020-07-12: qty 8

## 2020-07-12 NOTE — ED Notes (Addendum)
Pt SpO2 level while ambulated was 97-100. Pulse rate was at 73, Pt stated that she was not dizzy while ambulated.

## 2020-07-12 NOTE — ED Provider Notes (Signed)
[facility] EMERGENCY DEPARTMENT Provider Note   CSN: [id number] Arrival date & time: 07/12/20  1211     History Chief Complaint  Patient presents with  . Shortness of Breath    Abigail Huang is a 87 y.o. female.  HPI   Pt is an 87 y/o female with a h/o CHF, DM, HLD, HTN, renal disorder, who presents to the ED today for eval of shortness of breath that started a few weeks ago but worsened significantly over the last 3 days. SOB is constant. Denies orthopnea. Denies any chest pain but reports a fullness in her chest for the last week.  Denies cough or fevers.   Reports BLE swelling that is chronic but worsened over the last 6 months.    Daughter at bedside assist with history. She states that the pt has gained 10-15 lbs over the last 2-3 weeks. Has been compliant with diuretics at home.   Past Medical History:  Diagnosis Date  . CHF (congestive heart failure) ([place])   . Diabetes mellitus   . Hyperlipidemia   . Hypertension   . Renal disorder     Patient Active Problem List   Diagnosis Date Noted  . Acute on chronic renal failure ([place]) 03/17/2019  . Hypothyroidism 03/17/2019  . Acute CHF (congestive heart failure) ([place]) 03/17/2019  . Essential hypertension 01/06/2017  . Dyspnea on exertion 01/06/2017  . Hyperlipidemia 01/06/2017  . Closed displaced fracture of right femoral neck ([place]) 11/03/2016  . Diabetes mellitus ([place]) 11/03/2016  . Hyponatremia 11/03/2016  . Leukocytosis 11/03/2016  . Normocytic anemia 11/03/2016  . CKD (chronic kidney disease), stage IV ([place]) 11/03/2016    Past Surgical History:  Procedure Laterality Date  . ABDOMINAL HYSTERECTOMY    . ANTERIOR APPROACH HEMI HIP ARTHROPLASTY Right 11/04/2016   Procedure: ANTERIOR APPROACH HEMI HIP ARTHROPLASTY;  Surgeon: Dorna Leitz, MD;  Location: [facility];  Service: Orthopedics;  Laterality: Right;  . COLONOSCOPY    . KNEE SURGERY    . POLYPECTOMY       OB History   No obstetric history on  file.     Family History  Problem Relation Age of Onset  . Heart disease Mother   . Heart disease Father   . Heart disease Sister   . Colon cancer Neg Hx   . Esophageal cancer Neg Hx   . Rectal cancer Neg Hx     Social History   Tobacco Use  . Smoking status: Former Smoker    Quit date: 06/27/1967    Years since quitting: 53.0  . Smokeless tobacco: Never Used  Vaping Use  . Vaping Use: Never used  Substance Use Topics  . Alcohol use: No  . Drug use: No    Home Medications Prior to Admission medications   Medication Sig Start Date End Date Taking? Authorizing Provider  amLODipine (NORVASC) 10 MG tablet Take 10 mg by mouth daily.    [provider]  atenolol (TENORMIN) 50 MG tablet Take 50 mg by mouth daily.     [provider]  Calcium Carbonate-Vitamin D (CALTRATE 600+D PO) Take 1 tablet by mouth daily.     [provider]  furosemide (LASIX) 80 MG tablet Take 1 tablet (80 mg total) by mouth daily. Please take 80 mg twice a day for 5 days then continue 80 mg daily 03/21/19 04/20/19  Shelly Coss, MD  glipiZIDE (GLUCOTROL XL) 10 MG 24 hr tablet Take 10 mg by mouth daily with breakfast.  [provider]  insulin detemir (LEVEMIR) 100 UNIT/ML injection Inject 8 Units into the skin daily.     [provider]  levothyroxine (SYNTHROID) 25 MCG tablet  02/13/19   [provider]  Multiple Vitamins-Minerals (CENTRUM SILVER PO) Take 1 tablet by mouth daily.    [provider]  naproxen (NAPROSYN) 500 MG tablet Take 1 tablet (500 mg total) by mouth 2 (two) times daily as needed. 05/26/19   Isla Pence, MD  potassium chloride 20 MEQ TBCR Take 20 mEq by mouth daily. = 03/20/19   Shelly Coss, MD  simvastatin (ZOCOR) 20 MG tablet Take 20 mg by mouth daily.    [provider]    Allergies    Patient has no known allergies.  Review of Systems   Review of Systems  Constitutional: Negative for fever.   HENT: Negative for ear pain and sore throat.   Eyes: Negative for visual disturbance.  Respiratory: Positive for shortness of breath. Negative for cough.   Cardiovascular: Positive for leg swelling. Negative for chest pain.  Gastrointestinal: Negative for abdominal pain, constipation, diarrhea, nausea and vomiting.  Genitourinary: Negative for dysuria and hematuria.  Musculoskeletal: Negative for back pain.  Skin: Negative for rash.  Neurological: Negative for headaches.  All other systems reviewed and are negative.   Physical Exam Updated Vital Signs BP (!) 155/71   Pulse 73   Temp 97.9 F (36.6 C) (Oral)   Resp 20   Ht 5\' 6"  (1.676 m)   Wt 69.4 kg   SpO2 100%   BMI 24.69 kg/m   Physical Exam Vitals and nursing note reviewed.  Constitutional:      General: She is not in acute distress.    Appearance: She is well-developed and well-nourished.  HENT:     Head: Normocephalic and atraumatic.  Eyes:     Conjunctiva/sclera: Conjunctivae normal.  Cardiovascular:     Rate and Rhythm: Normal rate and regular rhythm.     Heart sounds: Normal heart sounds. No murmur heard.   Pulmonary:     Effort: Pulmonary effort is normal. No respiratory distress.     Breath sounds: Normal breath sounds. No decreased breath sounds, wheezing, rhonchi or rales.  Abdominal:     General: Bowel sounds are normal.     Palpations: Abdomen is soft.     Tenderness: There is no abdominal tenderness. There is no guarding or rebound.  Musculoskeletal:     Cervical back: Neck supple.     Right lower leg: Edema present.     Left lower leg: Edema present.  Skin:    General: Skin is warm and dry.  Neurological:     Mental Status: She is alert.  Psychiatric:        Mood and Affect: Mood and affect normal.     ED Results / Procedures / Treatments   Labs (all labs ordered are listed, but only abnormal results are displayed) Labs Reviewed  BASIC METABOLIC PANEL - Abnormal; Notable for the  following components:      Result Value   Sodium 128 (*)    Chloride 95 (*)    CO2 20 (*)    Glucose, Bld 295 (*)    BUN 67 (*)    Creatinine, Ser 3.87 (*)    GFR, Estimated 11 (*)    All other components within normal limits  CBC - Abnormal; Notable for the following components:   RBC 3.57 (*)    Hemoglobin 10.2 (*)  HCT 31.4 (*)    All other components within normal limits  BRAIN NATRIURETIC PEPTIDE - Abnormal; Notable for the following components:   B Natriuretic Peptide 749.6 (*)    All other components within normal limits  TROPONIN I (HIGH SENSITIVITY)  TROPONIN I (HIGH SENSITIVITY)    EKG EKG Interpretation  Date/Time:  Friday July 12 2020 12:22:21 EST Ventricular Rate:  71 PR Interval:  220 QRS Duration: 74 QT Interval:  406 QTC Calculation: 441 R Axis:   22 Text Interpretation: Sinus rhythm with 1st degree A-V block Septal infarct , age undetermined Abnormal ECG Since prior EG, LBBB no longer present Confirmed by Gareth Morgan [phone]) on 07/12/2020 2:11:32 PM   Radiology DG Chest Port 1 View  Result Date: 07/12/2020 CLINICAL DATA:  Shortness of breath EXAM: PORTABLE CHEST 1 VIEW COMPARISON:  March 17, 2019 FINDINGS: Lungs are clear. Heart is upper normal in size with pulmonary vascularity normal. No adenopathy. There is aortic atherosclerosis. A Hill-Sachs defect is noted in the left proximal humerus. IMPRESSION: Lungs clear.  Heart upper normal in size. Aortic Atherosclerosis (ICD10-I70.0). Electronically Signed   By: Lowella Grip III M.D.   On: 07/12/2020 13:24    Procedures Procedures   Medications Ordered in ED Medications  furosemide (LASIX) injection 80 mg (80 mg Intravenous Given 07/12/20 1457)    ED Course  I have reviewed the triage vital signs and the nursing notes.  Pertinent labs & imaging results that were available during my care of the patient were reviewed by me and considered in my medical decision making (see chart for  details).    MDM Rules/Calculators/A&P                          87 year old female presenting the emergency department today for evaluation of shortness of breath.  Also reporting leg swelling and increased weight over the last few weeks.  Reviewed/interpreted labs CBC is without leukocytosis, anemia present but stable from prior BMP with hyponatremia with a sodium of 128, elevated blood glucose but no elevated anion gap to suggest DKA.  BUN elevated at 67 and creatinine elevated at 3.87 which appears to be consistent with her baseline Troponin is negative BNP is elevated above 700  EKG - Sinus rhythm with 1st degree A-V block Septal infarct , age undetermined Abnormal ECG Since prior EG, LBBB no longer present   CXR -  Lungs clear.  Heart upper normal in size. Aortic Atherosclerosis  Patient presentation consistent with acute CHF exacerbation.  Recommended admission however patient states she would prefer to try dose of IV Lasix and be reassessed for potential discharge.  Will give 80 of Lasix.  Dr Jacelyn Grip evaluated pt following dose of lasix and she reports improvement of symptoms. She continues to prefer to be discharged and plans to follow-up with her doctor in 3 days for reassessment of labs and symptoms.  Her daughter is at bedside and is comfortable with this plan and agreeable for the patient being discharged.  She agrees to have the patient return to the ED for any new or worsening symptoms in the meantime.   Final Clinical Impression(s) / ED Diagnoses Final diagnoses:  Acute on chronic congestive heart failure, unspecified heart failure type [facility])    Rx / DC Orders ED Discharge Orders    None       Bishop Dublin 07/12/20 1630    Gareth Morgan, MD 07/14/20 1122

## 2020-07-12 NOTE — Discharge Instructions (Signed)
Today you were diagnosed with CHF exacerbation and were given Lasix to your IV.  We recommend that you resume your normal dose of Lasix tomorrow.  Given the increased Lasix dosing we would recommend that you have your kidney function and electrolytes rechecked in 3 days.  Please keep track of your weights at home, adhere to a low-salt diet.  Return to the emergency department for any new or worsening symptoms in the meantime including any chest pain, continued shortness of breath, worsening leg swelling, weight gain or other concerns.

## 2020-07-12 NOTE — ED Notes (Signed)
C/o shortness of breath x 2-3 weeks  Worse today  Denies cough

## 2020-07-12 NOTE — ED Triage Notes (Addendum)
Pt c/o SOB x 3-4 days-states is worse with activity-denies fever/cough-NAD-to triage in w/c-daughter states pt has hx of "fluid around her heart"-also states pt has dementia and is a fall risk when brought into triage-pt able to answer all questions without hesitation/difficulty-meets low fall risk on scale however fall risk bracelet placed right wrist

## 2020-07-18 DIAGNOSIS — I130 Hypertensive heart and chronic kidney disease with heart failure and stage 1 through stage 4 chronic kidney disease, or unspecified chronic kidney disease: Secondary | ICD-10-CM | POA: Diagnosis not present

## 2020-07-18 DIAGNOSIS — R609 Edema, unspecified: Secondary | ICD-10-CM | POA: Diagnosis not present

## 2020-07-18 DIAGNOSIS — I872 Venous insufficiency (chronic) (peripheral): Secondary | ICD-10-CM | POA: Diagnosis not present

## 2020-07-18 DIAGNOSIS — E871 Hypo-osmolality and hyponatremia: Secondary | ICD-10-CM | POA: Diagnosis not present

## 2020-07-18 DIAGNOSIS — N184 Chronic kidney disease, stage 4 (severe): Secondary | ICD-10-CM | POA: Diagnosis not present

## 2020-07-18 DIAGNOSIS — I5032 Chronic diastolic (congestive) heart failure: Secondary | ICD-10-CM | POA: Diagnosis not present

## 2020-07-31 DIAGNOSIS — I999 Unspecified disorder of circulatory system: Secondary | ICD-10-CM | POA: Diagnosis not present

## 2020-07-31 DIAGNOSIS — I5032 Chronic diastolic (congestive) heart failure: Secondary | ICD-10-CM | POA: Diagnosis not present

## 2020-07-31 DIAGNOSIS — E1129 Type 2 diabetes mellitus with other diabetic kidney complication: Secondary | ICD-10-CM | POA: Diagnosis not present

## 2020-07-31 DIAGNOSIS — N185 Chronic kidney disease, stage 5: Secondary | ICD-10-CM | POA: Diagnosis not present

## 2020-07-31 DIAGNOSIS — I132 Hypertensive heart and chronic kidney disease with heart failure and with stage 5 chronic kidney disease, or end stage renal disease: Secondary | ICD-10-CM | POA: Diagnosis not present

## 2020-07-31 DIAGNOSIS — I10 Essential (primary) hypertension: Secondary | ICD-10-CM | POA: Diagnosis not present

## 2020-07-31 DIAGNOSIS — E039 Hypothyroidism, unspecified: Secondary | ICD-10-CM | POA: Diagnosis not present

## 2020-07-31 DIAGNOSIS — I509 Heart failure, unspecified: Secondary | ICD-10-CM | POA: Diagnosis not present

## 2020-08-05 DIAGNOSIS — N185 Chronic kidney disease, stage 5: Secondary | ICD-10-CM | POA: Diagnosis not present

## 2020-08-05 DIAGNOSIS — I509 Heart failure, unspecified: Secondary | ICD-10-CM | POA: Diagnosis not present

## 2020-08-05 DIAGNOSIS — I132 Hypertensive heart and chronic kidney disease with heart failure and with stage 5 chronic kidney disease, or end stage renal disease: Secondary | ICD-10-CM | POA: Diagnosis not present

## 2020-08-05 DIAGNOSIS — I5032 Chronic diastolic (congestive) heart failure: Secondary | ICD-10-CM | POA: Diagnosis not present

## 2020-08-06 DIAGNOSIS — X32XXXD Exposure to sunlight, subsequent encounter: Secondary | ICD-10-CM | POA: Diagnosis not present

## 2020-08-06 DIAGNOSIS — Z85828 Personal history of other malignant neoplasm of skin: Secondary | ICD-10-CM | POA: Diagnosis not present

## 2020-08-06 DIAGNOSIS — Z08 Encounter for follow-up examination after completed treatment for malignant neoplasm: Secondary | ICD-10-CM | POA: Diagnosis not present

## 2020-08-06 DIAGNOSIS — L570 Actinic keratosis: Secondary | ICD-10-CM | POA: Diagnosis not present

## 2020-08-06 DIAGNOSIS — C44629 Squamous cell carcinoma of skin of left upper limb, including shoulder: Secondary | ICD-10-CM | POA: Diagnosis not present

## 2020-08-12 ENCOUNTER — Encounter (HOSPITAL_BASED_OUTPATIENT_CLINIC_OR_DEPARTMENT_OTHER): Payer: Self-pay | Admitting: Emergency Medicine

## 2020-08-12 ENCOUNTER — Emergency Department (HOSPITAL_BASED_OUTPATIENT_CLINIC_OR_DEPARTMENT_OTHER): Payer: Medicare HMO

## 2020-08-12 ENCOUNTER — Inpatient Hospital Stay (HOSPITAL_BASED_OUTPATIENT_CLINIC_OR_DEPARTMENT_OTHER)
Admission: EM | Admit: 2020-08-12 | Discharge: 2020-08-15 | DRG: 291 | Disposition: A | Discharge status: Home Health | Payer: Medicare HMO | Attending: Internal Medicine | Admitting: Internal Medicine

## 2020-08-12 ENCOUNTER — Other Ambulatory Visit: Payer: Self-pay

## 2020-08-12 DIAGNOSIS — E1122 Type 2 diabetes mellitus with diabetic chronic kidney disease: Secondary | ICD-10-CM | POA: Diagnosis present

## 2020-08-12 DIAGNOSIS — M329 Systemic lupus erythematosus, unspecified: Secondary | ICD-10-CM | POA: Diagnosis present

## 2020-08-12 DIAGNOSIS — E785 Hyperlipidemia, unspecified: Secondary | ICD-10-CM | POA: Diagnosis present

## 2020-08-12 DIAGNOSIS — Z8781 Personal history of (healed) traumatic fracture: Secondary | ICD-10-CM | POA: Diagnosis not present

## 2020-08-12 DIAGNOSIS — N184 Chronic kidney disease, stage 4 (severe): Secondary | ICD-10-CM | POA: Diagnosis not present

## 2020-08-12 DIAGNOSIS — Z8249 Family history of ischemic heart disease and other diseases of the circulatory system: Secondary | ICD-10-CM

## 2020-08-12 DIAGNOSIS — E1165 Type 2 diabetes mellitus with hyperglycemia: Secondary | ICD-10-CM | POA: Diagnosis present

## 2020-08-12 DIAGNOSIS — Z90710 Acquired absence of both cervix and uterus: Secondary | ICD-10-CM

## 2020-08-12 DIAGNOSIS — Z794 Long term (current) use of insulin: Secondary | ICD-10-CM

## 2020-08-12 DIAGNOSIS — I509 Heart failure, unspecified: Secondary | ICD-10-CM

## 2020-08-12 DIAGNOSIS — I083 Combined rheumatic disorders of mitral, aortic and tricuspid valves: Secondary | ICD-10-CM | POA: Diagnosis present

## 2020-08-12 DIAGNOSIS — E871 Hypo-osmolality and hyponatremia: Secondary | ICD-10-CM | POA: Diagnosis not present

## 2020-08-12 DIAGNOSIS — E8779 Other fluid overload: Secondary | ICD-10-CM | POA: Diagnosis not present

## 2020-08-12 DIAGNOSIS — Z96641 Presence of right artificial hip joint: Secondary | ICD-10-CM | POA: Diagnosis present

## 2020-08-12 DIAGNOSIS — J90 Pleural effusion, not elsewhere classified: Secondary | ICD-10-CM | POA: Diagnosis not present

## 2020-08-12 DIAGNOSIS — N185 Chronic kidney disease, stage 5: Secondary | ICD-10-CM | POA: Diagnosis present

## 2020-08-12 DIAGNOSIS — I110 Hypertensive heart disease with heart failure: Secondary | ICD-10-CM | POA: Diagnosis not present

## 2020-08-12 DIAGNOSIS — Z79899 Other long term (current) drug therapy: Secondary | ICD-10-CM | POA: Diagnosis not present

## 2020-08-12 DIAGNOSIS — E1140 Type 2 diabetes mellitus with diabetic neuropathy, unspecified: Secondary | ICD-10-CM | POA: Diagnosis not present

## 2020-08-12 DIAGNOSIS — N189 Chronic kidney disease, unspecified: Secondary | ICD-10-CM | POA: Diagnosis not present

## 2020-08-12 DIAGNOSIS — R0602 Shortness of breath: Secondary | ICD-10-CM | POA: Diagnosis not present

## 2020-08-12 DIAGNOSIS — I5033 Acute on chronic diastolic (congestive) heart failure: Secondary | ICD-10-CM | POA: Diagnosis not present

## 2020-08-12 DIAGNOSIS — D631 Anemia in chronic kidney disease: Secondary | ICD-10-CM | POA: Diagnosis present

## 2020-08-12 DIAGNOSIS — J9811 Atelectasis: Secondary | ICD-10-CM | POA: Diagnosis not present

## 2020-08-12 DIAGNOSIS — I132 Hypertensive heart and chronic kidney disease with heart failure and with stage 5 chronic kidney disease, or end stage renal disease: Secondary | ICD-10-CM | POA: Diagnosis not present

## 2020-08-12 DIAGNOSIS — E119 Type 2 diabetes mellitus without complications: Secondary | ICD-10-CM | POA: Diagnosis not present

## 2020-08-12 DIAGNOSIS — I10 Essential (primary) hypertension: Secondary | ICD-10-CM | POA: Diagnosis not present

## 2020-08-12 DIAGNOSIS — E039 Hypothyroidism, unspecified: Secondary | ICD-10-CM | POA: Diagnosis present

## 2020-08-12 DIAGNOSIS — Z20822 Contact with and (suspected) exposure to covid-19: Secondary | ICD-10-CM | POA: Diagnosis present

## 2020-08-12 DIAGNOSIS — E11649 Type 2 diabetes mellitus with hypoglycemia without coma: Secondary | ICD-10-CM | POA: Diagnosis present

## 2020-08-12 DIAGNOSIS — I130 Hypertensive heart and chronic kidney disease with heart failure and stage 1 through stage 4 chronic kidney disease, or unspecified chronic kidney disease: Secondary | ICD-10-CM | POA: Diagnosis not present

## 2020-08-12 DIAGNOSIS — I5032 Chronic diastolic (congestive) heart failure: Secondary | ICD-10-CM | POA: Diagnosis not present

## 2020-08-12 DIAGNOSIS — Z87891 Personal history of nicotine dependence: Secondary | ICD-10-CM

## 2020-08-12 DIAGNOSIS — D649 Anemia, unspecified: Secondary | ICD-10-CM | POA: Diagnosis not present

## 2020-08-12 LAB — CBC WITH DIFFERENTIAL/PLATELET
Abs Immature Granulocytes: 0.05 10*3/uL (ref 0.00–0.07)
Basophils Absolute: 0 10*3/uL (ref 0.0–0.1)
Basophils Relative: 0 %
Eosinophils Absolute: 0.1 10*3/uL (ref 0.0–0.5)
Eosinophils Relative: 1 %
HCT: 33 % — ABNORMAL LOW (ref 36.0–46.0)
Hemoglobin: 10.8 g/dL — ABNORMAL LOW (ref 12.0–15.0)
Immature Granulocytes: 1 %
Lymphocytes Relative: 12 %
Lymphs Abs: 1 10*3/uL (ref 0.7–4.0)
MCH: 28.2 pg (ref 26.0–34.0)
MCHC: 32.7 g/dL (ref 30.0–36.0)
MCV: 86.2 fL (ref 80.0–100.0)
Monocytes Absolute: 0.7 10*3/uL (ref 0.1–1.0)
Monocytes Relative: 9 %
Neutro Abs: 6.6 10*3/uL (ref 1.7–7.7)
Neutrophils Relative %: 77 %
Platelets: 262 10*3/uL (ref 150–400)
RBC: 3.83 MIL/uL — ABNORMAL LOW (ref 3.87–5.11)
RDW: 12.7 % (ref 11.5–15.5)
WBC: 8.5 10*3/uL (ref 4.0–10.5)
nRBC: 0 % (ref 0.0–0.2)

## 2020-08-12 LAB — URINALYSIS, MICROSCOPIC (REFLEX)

## 2020-08-12 LAB — COMPREHENSIVE METABOLIC PANEL
ALT: 27 U/L (ref 0–44)
AST: 21 U/L (ref 15–41)
Albumin: 3.8 g/dL (ref 3.5–5.0)
Alkaline Phosphatase: 67 U/L (ref 38–126)
Anion gap: 13 (ref 5–15)
BUN: 62 mg/dL — ABNORMAL HIGH (ref 8–23)
CO2: 19 mmol/L — ABNORMAL LOW (ref 22–32)
Calcium: 9.4 mg/dL (ref 8.9–10.3)
Chloride: 97 mmol/L — ABNORMAL LOW (ref 98–111)
Creatinine, Ser: 3.63 mg/dL — ABNORMAL HIGH (ref 0.44–1.00)
GFR, Estimated: 12 mL/min — ABNORMAL LOW (ref >60)
Glucose, Bld: 223 mg/dL — ABNORMAL HIGH (ref 70–99)
Potassium: 4.2 mmol/L (ref 3.5–5.1)
Sodium: 129 mmol/L — ABNORMAL LOW (ref 135–145)
Total Bilirubin: 0.5 mg/dL (ref 0.3–1.2)
Total Protein: 7.2 g/dL (ref 6.5–8.1)

## 2020-08-12 LAB — URINALYSIS, ROUTINE W REFLEX MICROSCOPIC
Bilirubin Urine: NEGATIVE
Glucose, UA: 100 mg/dL — AB
Ketones, ur: NEGATIVE mg/dL
Nitrite: NEGATIVE
Protein, ur: >300 mg/dL — AB
Specific Gravity, Urine: 1.015 (ref 1.005–1.030)
pH: 6 (ref 5.0–8.0)

## 2020-08-12 LAB — LIPASE, BLOOD: Lipase: 67 U/L — ABNORMAL HIGH (ref 11–51)

## 2020-08-12 LAB — BRAIN NATRIURETIC PEPTIDE: B Natriuretic Peptide: 814.9 pg/mL — ABNORMAL HIGH (ref 0.0–100.0)

## 2020-08-12 LAB — RESP PANEL BY RT-PCR (FLU A&B, COVID) ARPGX2
Influenza A by PCR: NEGATIVE
Influenza B by PCR: NEGATIVE
SARS Coronavirus 2 by RT PCR: NEGATIVE

## 2020-08-12 LAB — GLUCOSE, CAPILLARY: Glucose-Capillary: 185 mg/dL — ABNORMAL HIGH (ref 70–99)

## 2020-08-12 LAB — TROPONIN I (HIGH SENSITIVITY)
Troponin I (High Sensitivity): 14 ng/L (ref <18)
Troponin I (High Sensitivity): 14 ng/L (ref <18)

## 2020-08-12 MED ORDER — LEVOTHYROXINE SODIUM 50 MCG PO TABS
50.0000 ug | ORAL_TABLET | Freq: Every day | ORAL | Status: DC
  Administered 2020-08-13 – 2020-08-15 (×3): 50 ug via ORAL
  Filled 2020-08-12 (×3): qty 1

## 2020-08-12 MED ORDER — SODIUM CHLORIDE 0.9 % IV SOLN: 250.0000 mL | INTRAVENOUS | Status: DC | PRN

## 2020-08-12 MED ORDER — HEPARIN SODIUM (PORCINE) 5000 UNIT/ML IJ SOLN
5000.0000 [IU] | Freq: Three times a day (TID) | INTRAMUSCULAR | Status: DC
  Administered 2020-08-12 – 2020-08-15 (×9): 5000 [IU] via SUBCUTANEOUS
  Filled 2020-08-12 (×9): qty 1

## 2020-08-12 MED ORDER — ACETAMINOPHEN 325 MG PO TABS: 650.0000 mg | ORAL_TABLET | ORAL | Status: DC | PRN

## 2020-08-12 MED ORDER — FUROSEMIDE 10 MG/ML IJ SOLN
80.0000 mg | Freq: Once | INTRAMUSCULAR | Status: AC
  Administered 2020-08-12: 80 mg via INTRAVENOUS
  Filled 2020-08-12: qty 8

## 2020-08-12 MED ORDER — SIMVASTATIN 20 MG PO TABS
20.0000 mg | ORAL_TABLET | Freq: Every day | ORAL | Status: DC
  Administered 2020-08-13 – 2020-08-15 (×3): 20 mg via ORAL
  Filled 2020-08-12 (×3): qty 1

## 2020-08-12 MED ORDER — INSULIN ASPART 100 UNIT/ML ~~LOC~~ SOLN
0.0000 [IU] | Freq: Three times a day (TID) | SUBCUTANEOUS | Status: DC
  Administered 2020-08-13 (×2): 1 [IU] via SUBCUTANEOUS
  Administered 2020-08-14 – 2020-08-15 (×3): 2 [IU] via SUBCUTANEOUS
  Administered 2020-08-15: 1 [IU] via SUBCUTANEOUS
  Administered 2020-08-15: 5 [IU] via SUBCUTANEOUS

## 2020-08-12 MED ORDER — FUROSEMIDE 10 MG/ML IJ SOLN
80.0000 mg | Freq: Two times a day (BID) | INTRAMUSCULAR | Status: DC
  Administered 2020-08-13 – 2020-08-15 (×6): 80 mg via INTRAVENOUS
  Filled 2020-08-12 (×6): qty 8

## 2020-08-12 MED ORDER — ONDANSETRON HCL 4 MG/2ML IJ SOLN: 4.0000 mg | Freq: Four times a day (QID) | INTRAMUSCULAR | Status: DC | PRN

## 2020-08-12 MED ORDER — INSULIN ASPART 100 UNIT/ML ~~LOC~~ SOLN
0.0000 [IU] | Freq: Every day | SUBCUTANEOUS | Status: DC
  Administered 2020-08-13: 1 [IU] via SUBCUTANEOUS
  Administered 2020-08-14: 2 [IU] via SUBCUTANEOUS

## 2020-08-12 MED ORDER — ATENOLOL 50 MG PO TABS
50.0000 mg | ORAL_TABLET | Freq: Every day | ORAL | Status: DC
  Administered 2020-08-12 – 2020-08-13 (×2): 50 mg via ORAL
  Filled 2020-08-12 (×2): qty 1

## 2020-08-12 MED ORDER — INSULIN GLARGINE 100 UNIT/ML ~~LOC~~ SOLN
5.0000 [IU] | Freq: Every day | SUBCUTANEOUS | Status: DC
  Administered 2020-08-12: 5 [IU] via SUBCUTANEOUS
  Filled 2020-08-12 (×2): qty 0.05

## 2020-08-12 NOTE — ED Notes (Signed)
Up to br via wc

## 2020-08-12 NOTE — H&P (Signed)
History and Physical    Abigail Huang DXA:[id number] DOB: Jun 08, 1931 DOA: 08/12/2020  PCP: Burnard Bunting, MD   Patient coming from: Home   Chief Complaint: SOB, weight gain   HPI: Abigail Huang is a 87 y.o. female with medical history significant for chronic kidney disease stage V, insulin-dependent diabetes mellitus, chronic diastolic CHF, and hypertension, now presenting to the emergency department for evaluation of shortness of breath.  Patient reports progressive shortness of breath over the course of weeks to months, has not had any improvement with increased Lasix as an outpatient, and reports a recent 10 pound weight gain.  She denies any chest pain, has not noticed much change in her chronic lower extremity swelling, and denies any fevers or chills.  [facility] ED Course: Upon arrival to the ED, patient is found to be afebrile and saturating well on room air with stable BP and normal HR and RR.  EKG features a sinus rhythm and chest x-ray notable for bronchitic changes with small right pleural effusion and right base atelectasis.  Chemistry panel features a glucose of 223, sodium 129, bicarbonate 19, BUN 62, and creatinine 3.63.  CBC with hemoglobin of 10.8.  Troponin is normal x2 and BNP is elevated to 815.  COVID-19 PCR is negative.  Patient was given 80 mg IV Lasix in the ED.  Review of Systems:  All other systems reviewed and apart from HPI, are negative.  Past Medical History:  Diagnosis Date  . CHF (congestive heart failure) ([place])   . Diabetes mellitus   . Hyperlipidemia   . Hypertension   . Renal disorder     Past Surgical History:  Procedure Laterality Date  . ABDOMINAL HYSTERECTOMY    . ANTERIOR APPROACH HEMI HIP ARTHROPLASTY Right 11/04/2016   Procedure: ANTERIOR APPROACH HEMI HIP ARTHROPLASTY;  Surgeon: Dorna Leitz, MD;  Location: [facility];  Service: Orthopedics;  Laterality: Right;  . COLONOSCOPY    . KNEE SURGERY    . POLYPECTOMY       Social History:   reports that she quit smoking about 53 years ago. She has never used smokeless tobacco. She reports that she does not drink alcohol and does not use drugs.  No Known Allergies  Family History  Problem Relation Age of Onset  . Heart disease Mother   . Heart disease Father   . Heart disease Sister   . Colon cancer Neg Hx   . Esophageal cancer Neg Hx   . Rectal cancer Neg Hx      Prior to Admission medications   Medication Sig Start Date End Date Taking? Authorizing Provider  amLODipine (NORVASC) 10 MG tablet Take 10 mg by mouth daily.    [provider]  atenolol (TENORMIN) 50 MG tablet Take 50 mg by mouth daily.     [provider]  Calcium Carbonate-Vitamin D (CALTRATE 600+D PO) Take 1 tablet by mouth daily.     [provider]  furosemide (LASIX) 80 MG tablet Take 1 tablet (80 mg total) by mouth daily. Please take 80 mg twice a day for 5 days then continue 80 mg daily 03/21/19 04/20/19  Shelly Coss, MD  glipiZIDE (GLUCOTROL XL) 10 MG 24 hr tablet Take 10 mg by mouth daily with breakfast.    [provider]  insulin detemir (LEVEMIR) 100 UNIT/ML injection Inject 8 Units into the skin daily.     [provider]  levothyroxine (SYNTHROID) 25 MCG tablet  02/13/19   [provider]  levothyroxine (SYNTHROID) 50 MCG tablet Take 50 mcg by mouth daily. 06/18/20   [provider]  Multiple Vitamins-Minerals (CENTRUM SILVER PO) Take 1 tablet by mouth daily.    [provider]  naproxen (NAPROSYN) 500 MG tablet Take 1 tablet (500 mg total) by mouth 2 (two) times daily as needed. 05/26/19   Isla Pence, MD  potassium chloride 20 MEQ TBCR Take 20 mEq by mouth daily. = 03/20/19   Shelly Coss, MD  simvastatin (ZOCOR) 20 MG tablet Take 20 mg by mouth daily.    [provider]    Physical Exam: Vitals:   08/12/20 1754 08/12/20 1800 08/12/20 1816 08/12/20 1850  BP: (!) 154/66 (!) 148/77   (!) 165/67  Pulse: 66 66  69  Resp: 18 18  16   Temp: 97.8 F (36.6 C)  98.9 F (37.2 C) 97.7 F (36.5 C)  TempSrc: Oral  Oral Oral  SpO2: 97% 99%  98%  Weight:      Height:        Constitutional: NAD, calm  Eyes: PERTLA, lids and conjunctivae normal ENMT: Mucous membranes are moist. Posterior pharynx clear of any exudate or lesions.   Neck: normal, supple, no masses, no thyromegaly Respiratory: no wheezing, no crackles. No accessory muscle use.  Cardiovascular: S1 & S2 heard, regular rate and rhythm. Mild bilateral lower leg swelling. Abdomen: No distension, no tenderness, soft. Bowel sounds active.  Musculoskeletal: no clubbing / cyanosis. No joint deformity upper and lower extremities.   Skin: no significant rashes, lesions, ulcers. Warm, dry, well-perfused. Neurologic: CN 2-12 grossly intact. Sensation intact. Strength 5/5 in all 4 limbs.  Psychiatric: Alert and oriented to person, place, and situation. Calm and cooperative.    Labs and Imaging on Admission: I have personally reviewed following labs and imaging studies  CBC: Recent Labs  Lab 08/12/20 1312  WBC 8.5  NEUTROABS 6.6  HGB 10.8*  HCT 33.0*  MCV 86.2  PLT 789   Basic Metabolic Panel: Recent Labs  Lab 08/12/20 1312  NA 129*  K 4.2  CL 97*  CO2 19*  GLUCOSE 223*  BUN 62*  CREATININE 3.63*  CALCIUM 9.4   GFR: Estimated Creatinine Clearance: 11.2 mL/min (A) (by C-G formula based on SCr of 3.63 mg/dL (H)). Liver Function Tests: Recent Labs  Lab 08/12/20 1312  AST 21  ALT 27  ALKPHOS 67  BILITOT 0.5  PROT 7.2  ALBUMIN 3.8   Recent Labs  Lab 08/12/20 1312  LIPASE 67*   No results for input(s): AMMONIA in the last 168 hours. Coagulation Profile: No results for input(s): INR, PROTIME in the last 168 hours. Cardiac Enzymes: No results for input(s): CKTOTAL, CKMB, CKMBINDEX, TROPONINI in the last 168 hours. BNP (last 3 results) No results for input(s): PROBNP in the last 8760  hours. HbA1C: No results for input(s): HGBA1C in the last 72 hours. CBG: No results for input(s): GLUCAP in the last 168 hours. Lipid Profile: No results for input(s): CHOL, HDL, LDLCALC, TRIG, CHOLHDL, LDLDIRECT in the last 72 hours. Thyroid Function Tests: No results for input(s): TSH, T4TOTAL, FREET4, T3FREE, THYROIDAB in the last 72 hours. Anemia Panel: No results for input(s): VITAMINB12, FOLATE, FERRITIN, TIBC, IRON, RETICCTPCT in the last 72 hours. Urine analysis:    Component Value Date/Time   COLORURINE STRAW (A) 08/12/2020 1312   APPEARANCEUR CLOUDY (A) 08/12/2020 1312   LABSPEC 1.015 08/12/2020 1312   PHURINE 6.0 08/12/2020 1312   GLUCOSEU 100 (A) 08/12/2020 1312  HGBUR SMALL (A) 08/12/2020 1312   BILIRUBINUR NEGATIVE 08/12/2020 1312   KETONESUR NEGATIVE 08/12/2020 1312   PROTEINUR >300 (A) 08/12/2020 1312   UROBILINOGEN 0.2 03/08/2012 1613   NITRITE NEGATIVE 08/12/2020 1312   LEUKOCYTESUR SMALL (A) 08/12/2020 1312   Sepsis Labs: @LABRCNTIP (procalcitonin:4,lacticidven:4) ) Recent Results (from the past 240 hour(s))  Resp Panel by RT-PCR (Flu A&B, Covid) Nasopharyngeal Swab     Status: None   Collection Time: 08/12/20  1:12 PM   Specimen: Nasopharyngeal Swab; Nasopharyngeal(NP) swabs in vial transport medium  Result Value Ref Range Status   SARS Coronavirus 2 by RT PCR NEGATIVE NEGATIVE Final    Comment: (NOTE) SARS-CoV-2 target nucleic acids are NOT DETECTED.  The SARS-CoV-2 RNA is generally detectable in upper respiratory specimens during the acute phase of infection. The lowest concentration of SARS-CoV-2 viral copies this assay can detect is 138 copies/mL. A negative result does not preclude SARS-Cov-2 infection and should not be used as the sole basis for treatment or other patient management decisions. A negative result may occur with  improper specimen collection/handling, submission of specimen other than nasopharyngeal swab, presence of viral  mutation(s) within the areas targeted by this assay, and inadequate number of viral copies(<138 copies/mL). A negative result must be combined with clinical observations, patient history, and epidemiological information. The expected result is Negative.  Fact Sheet for Patients:  EntrepreneurPulse.com.au  Fact Sheet for Healthcare Providers:  IncredibleEmployment.be  This test is no t yet approved or cleared by the [place] FDA and  has been authorized for detection and/or diagnosis of SARS-CoV-2 by FDA under an Emergency Use Authorization (EUA). This EUA will remain  in effect (meaning this test can be used) for the duration of the COVID-19 declaration under Section 564(b)(1) of the Act, 21 U.S.C.section 360bbb-3(b)(1), unless the authorization is terminated  or revoked sooner.       Influenza A by PCR NEGATIVE NEGATIVE Final   Influenza B by PCR NEGATIVE NEGATIVE Final    Comment: (NOTE) The Xpert Xpress SARS-CoV-2/FLU/RSV plus assay is intended as an aid in the diagnosis of influenza from Nasopharyngeal swab specimens and should not be used as a sole basis for treatment. Nasal washings and aspirates are unacceptable for Xpert Xpress SARS-CoV-2/FLU/RSV testing.  Fact Sheet for Patients: EntrepreneurPulse.com.au  Fact Sheet for Healthcare Providers: IncredibleEmployment.be  This test is not yet approved or cleared by the [place] FDA and has been authorized for detection and/or diagnosis of SARS-CoV-2 by FDA under an Emergency Use Authorization (EUA). This EUA will remain in effect (meaning this test can be used) for the duration of the COVID-19 declaration under Section 564(b)(1) of the Act, 21 U.S.C. section 360bbb-3(b)(1), unless the authorization is terminated or revoked.  Performed at [facility], [address] [place], [place] [address]      Radiological Exams on  Admission: DG Chest Portable 1 View  Result Date: 08/12/2020 CLINICAL DATA:  Increased shortness of breath for few months, supposed to start dialysis, 10 pound weight gain, CHF, diabetes mellitus, hypertension, chronic kidney disease EXAM: PORTABLE CHEST 1 VIEW COMPARISON:  Portable exam 1302 hours compared to 07/12/2020 FINDINGS: Upper normal size of cardiac silhouette. Mediastinal contours and pulmonary vascularity normal. Atherosclerotic calcification aorta. Small RIGHT pleural effusion and RIGHT basilar atelectasis. Central peribronchial thickening. No acute infiltrate or pneumothorax. Bones demineralized. IMPRESSION: Bronchitic changes with small RIGHT pleural effusion and RIGHT basilar atelectasis. Electronically Signed   By: Lavonia Dana M.D.   On: 08/12/2020 13:19  EKG: Independently reviewed. Sinus rhythm.   Assessment/Plan   1. Acute on chronic diastolic CHF  - Presents with progressive SOB and wt gain despite recent increase in Lasix  - BNP is 815, small pleural effusion on CXR, EF was preserved on echo in October 2020  - She was given Lasix 80 mg IV in ED  - Continue diuresis with Lasix 80 mg IV q12h, monitor weight and I/Os, continue beta-blocker, monitor renal function and electrolytes    2. CKD V  - SCr is 3.63 on admission, consistent with her apparent baseline  - Renally-dose medications, monitor closely while diuresing    3. Hyponatremia  - Serum sodium is 129 in setting of hypervolemia, corrects to 131 when accounting for hyperglycemia  - Similar to priors, monitor closely while diuresing    4. Insulin-dependent DM  - A1c was 6.5% in 2020  - Continue CBG checks and insulin    5. Hypertension  - Continue atenolol    6. Hypothyroidism  - Continue Synthroid     DVT prophylaxis: sq heparin  Code Status: Full for now, discussed with patient who is not sure and wants to talk to her family about this  Level of Care: Level of care: Telemetry Family Communication:  none available at time of admission  Disposition Plan:  Patient is from: Home  Anticipated d/c is to: TBD Anticipated d/c date is: 08/14/20 Patient currently: Being diuresed  Consults called: None  Admission status: Inpatient     Vianne Bulls, MD [facility]  08/12/2020, 8:47 PM

## 2020-08-12 NOTE — ED Notes (Signed)
Called for consult w [facility]

## 2020-08-12 NOTE — ED Triage Notes (Signed)
Sob  X a couple of months she states    per her son she was to start process of dialysis but never did and now she is sob more , pt states she has gained weight  10 lbs , was seen at [facility], pt doesn't want dialysis , lives at home with her husband

## 2020-08-12 NOTE — ED Notes (Signed)
Pt given crackers and ginger ale, report to [facility] and I did call son terry and left message on phone also called her husband

## 2020-08-12 NOTE — ED Provider Notes (Signed)
[facility] EMERGENCY DEPARTMENT Provider Note   CSN: [id number] Arrival date & time: 08/12/20  1235     History Chief Complaint  Patient presents with  . Shortness of Breath    Abigail Huang is a 87 y.o. female.  The history is provided by the patient.  Shortness of Breath Severity:  Moderate Onset quality:  Gradual Timing:  Constant Progression:  Worsening Chronicity:  Recurrent Context comment:  Worsenign SOB despite increased lasix use. Increased weight gain with 10 pounds above dry weight. CHF and CKD Relieved by:  Nothing Worsened by:  Exertion Associated symptoms: no abdominal pain, no chest pain, no claudication, no cough, no ear pain, no fever, no rash, no sore throat and no vomiting        Past Medical History:  Diagnosis Date  . CHF (congestive heart failure) ([place])   . Diabetes mellitus   . Hyperlipidemia   . Hypertension   . Renal disorder     Patient Active Problem List   Diagnosis Date Noted  . Acute on chronic renal failure ([place]) 03/17/2019  . Hypothyroidism 03/17/2019  . Acute CHF (congestive heart failure) ([place]) 03/17/2019  . Essential hypertension 01/06/2017  . Dyspnea on exertion 01/06/2017  . Hyperlipidemia 01/06/2017  . Closed displaced fracture of right femoral neck ([place]) 11/03/2016  . Diabetes mellitus ([place]) 11/03/2016  . Hyponatremia 11/03/2016  . Leukocytosis 11/03/2016  . Normocytic anemia 11/03/2016  . CKD (chronic kidney disease), stage IV ([place]) 11/03/2016    Past Surgical History:  Procedure Laterality Date  . ABDOMINAL HYSTERECTOMY    . ANTERIOR APPROACH HEMI HIP ARTHROPLASTY Right 11/04/2016   Procedure: ANTERIOR APPROACH HEMI HIP ARTHROPLASTY;  Surgeon: Dorna Leitz, MD;  Location: [place];  Service: Orthopedics;  Laterality: Right;  . COLONOSCOPY    . KNEE SURGERY    . POLYPECTOMY       OB History   No obstetric history on file.     Family History  Problem Relation Age of Onset  . Heart disease  Mother   . Heart disease Father   . Heart disease Sister   . Colon cancer Neg Hx   . Esophageal cancer Neg Hx   . Rectal cancer Neg Hx     Social History   Tobacco Use  . Smoking status: Former Smoker    Quit date: 06/27/1967    Years since quitting: 53.1  . Smokeless tobacco: Never Used  Vaping Use  . Vaping Use: Never used  Substance Use Topics  . Alcohol use: No  . Drug use: No    Home Medications Prior to Admission medications   Medication Sig Start Date End Date Taking? Authorizing Provider  amLODipine (NORVASC) 10 MG tablet Take 10 mg by mouth daily.    [provider]  atenolol (TENORMIN) 50 MG tablet Take 50 mg by mouth daily.     [provider]  Calcium Carbonate-Vitamin D (CALTRATE 600+D PO) Take 1 tablet by mouth daily.     [provider]  furosemide (LASIX) 80 MG tablet Take 1 tablet (80 mg total) by mouth daily. Please take 80 mg twice a day for 5 days then continue 80 mg daily 03/21/19 04/20/19  Shelly Coss, MD  glipiZIDE (GLUCOTROL XL) 10 MG 24 hr tablet Take 10 mg by mouth daily with breakfast.    [provider]  insulin detemir (LEVEMIR) 100 UNIT/ML injection Inject 8 Units into the skin daily.     [provider]  levothyroxine (  SYNTHROID) 25 MCG tablet  02/13/19   [provider]  Multiple Vitamins-Minerals (CENTRUM SILVER PO) Take 1 tablet by mouth daily.    [provider]  naproxen (NAPROSYN) 500 MG tablet Take 1 tablet (500 mg total) by mouth 2 (two) times daily as needed. 05/26/19   Isla Pence, MD  potassium chloride 20 MEQ TBCR Take 20 mEq by mouth daily. = 03/20/19   Shelly Coss, MD  simvastatin (ZOCOR) 20 MG tablet Take 20 mg by mouth daily.    [provider]    Allergies    Patient has no known allergies.  Review of Systems   Review of Systems  Constitutional: Negative for chills and fever.  HENT: Negative for ear pain and sore throat.   Eyes: Negative for pain  and visual disturbance.  Respiratory: Positive for shortness of breath. Negative for cough.   Cardiovascular: Positive for leg swelling. Negative for chest pain, palpitations and claudication.  Gastrointestinal: Negative for abdominal pain and vomiting.  Genitourinary: Negative for dysuria and hematuria.  Musculoskeletal: Negative for arthralgias and back pain.  Skin: Negative for color change and rash.  Neurological: Negative for seizures and syncope.  All other systems reviewed and are negative.   Physical Exam Updated Vital Signs  ED Triage Vitals  Enc Vitals Group     BP 08/12/20 1258 (!) 155/65     Pulse Rate 08/12/20 1258 64     Resp 08/12/20 1258 15     Temp 08/12/20 1258 (!) 97.4 F (36.3 C)     Temp Source 08/12/20 1258 Oral     SpO2 08/12/20 1258 98 %     Weight 08/12/20 1258 163 lb (73.9 kg)     Height 08/12/20 1258 5\' 6"  (1.676 m)     Head Circumference --      Peak Flow --      Pain Score --      Pain Loc --      Pain Edu? --      Excl. in [place]? --      Physical Exam Vitals and nursing note reviewed.  Constitutional:      General: She is not in acute distress.    Appearance: She is well-developed and well-nourished. She is not ill-appearing.  HENT:     Head: Normocephalic and atraumatic.     Mouth/Throat:     Mouth: Mucous membranes are moist.  Eyes:     Conjunctiva/sclera: Conjunctivae normal.     Pupils: Pupils are equal, round, and reactive to light.  Cardiovascular:     Rate and Rhythm: Normal rate and regular rhythm.     Pulses: Normal pulses.     Heart sounds: Normal heart sounds. No murmur heard.   Pulmonary:     Effort: Pulmonary effort is normal. No respiratory distress.     Breath sounds: Decreased breath sounds and rales present.  Abdominal:     Palpations: Abdomen is soft.     Tenderness: There is no abdominal tenderness.  Musculoskeletal:     Cervical back: Normal range of motion and neck supple.     Right lower leg: Edema (1+)  present.     Left lower leg: Edema (1+) present.  Skin:    General: Skin is warm and dry.     Capillary Refill: Capillary refill takes less than 2 seconds.  Neurological:     General: No focal deficit present.     Mental Status: She is alert.  Psychiatric:  Mood and Affect: Mood and affect normal.     ED Results / Procedures / Treatments   Labs (all labs ordered are listed, but only abnormal results are displayed) Labs Reviewed  CBC WITH DIFFERENTIAL/PLATELET - Abnormal; Notable for the following components:      Result Value   RBC 3.83 (*)    Hemoglobin 10.8 (*)    HCT 33.0 (*)    All other components within normal limits  COMPREHENSIVE METABOLIC PANEL - Abnormal; Notable for the following components:   Sodium 129 (*)    Chloride 97 (*)    CO2 19 (*)    Glucose, Bld 223 (*)    BUN 62 (*)    Creatinine, Ser 3.63 (*)    GFR, Estimated 12 (*)    All other components within normal limits  LIPASE, BLOOD - Abnormal; Notable for the following components:   Lipase 67 (*)    All other components within normal limits  BRAIN NATRIURETIC PEPTIDE - Abnormal; Notable for the following components:   B Natriuretic Peptide 814.9 (*)    All other components within normal limits  RESP PANEL BY RT-PCR (FLU A&B, COVID) [id number]  URINALYSIS, ROUTINE W REFLEX MICROSCOPIC  TROPONIN I (HIGH SENSITIVITY)    EKG EKG Interpretation  Date/Time:  Monday August 12 2020 12:53:25 EST Ventricular Rate:  65 PR Interval:    QRS Duration: 95 QT Interval:  475 QTC Calculation: 494 R Axis:   52 Text Interpretation: Sinus rhythm Nonspecific T abnormalities, lateral leads Borderline prolonged QT interval Confirmed by Lennice Sites [id number]) on 08/12/2020 1:12:33 PM   Radiology DG Chest Portable 1 View  Result Date: 08/12/2020 CLINICAL DATA:  Increased shortness of breath for few months, supposed to start dialysis, 10 pound weight gain, CHF, diabetes mellitus, hypertension, chronic kidney  disease EXAM: PORTABLE CHEST 1 VIEW COMPARISON:  Portable exam 1302 hours compared to 07/12/2020 FINDINGS: Upper normal size of cardiac silhouette. Mediastinal contours and pulmonary vascularity normal. Atherosclerotic calcification aorta. Small RIGHT pleural effusion and RIGHT basilar atelectasis. Central peribronchial thickening. No acute infiltrate or pneumothorax. Bones demineralized. IMPRESSION: Bronchitic changes with small RIGHT pleural effusion and RIGHT basilar atelectasis. Electronically Signed   By: Lavonia Dana M.D.   On: 08/12/2020 13:19    Procedures Procedures   Medications Ordered in ED Medications  furosemide (LASIX) injection 80 mg (has no administration in time range)    ED Course  I have reviewed the triage vital signs and the nursing notes.  Pertinent labs & imaging results that were available during my care of the patient were reviewed by me and considered in my medical decision making (see chart for details).    MDM Rules/Calculators/A&P                          Abigail Huang is an 87 year old female with history of diabetes, high cholesterol, hypertension, CKD, CHF who presents the ED with shortness of breath.  Normal vitals.  No fever.  Symptoms increasing over the last several weeks.  Recently increased her dose of Lasix per primary care doctor's orders but still has 10 pound weight gain.  Has edema in her legs, rales on lung exam.  Overall appears clinically volume overloaded.  She states that she is having a good response with the Lasix and urinating well.  She denies any chest pain or infectious symptoms.  Will evaluate for heart failure exacerbation.  Lower concern for PE given likelihood of heart  failure exacerbation.  Will check electrolytes, troponin, EKG, chest x-ray.  Could also be progression of her chronic kidney disease as well.  No significant leukocytosis or anemia. Creatinine at baseline at 3.6. Blood sugar mildly elevated at 223. Troponin is 14.  BNP is 814 while above her baseline. Covid test is negative. Chest x-ray shows small right-sided pleural effusion. Overall suspect heart failure exacerbation causing significant symptoms. Increase Lasix at home has not helped and believe she would benefit from more aggressive diuresis and close management of her vitals and electrolytes. Will admit to medicine for further care.  This chart was dictated using voice recognition software.  Despite best efforts to proofread,  errors can occur which can change the documentation meaning.    Final Clinical Impression(s) / ED Diagnoses Final diagnoses:  Acute on chronic congestive heart failure, unspecified heart failure type [facility])    Rx / DC Orders ED Discharge Orders    None       Lennice Sites, DO 08/12/20 1416

## 2020-08-12 NOTE — ED Notes (Signed)
[facility] here to get pt, pt [id number]

## 2020-08-13 DIAGNOSIS — I5033 Acute on chronic diastolic (congestive) heart failure: Secondary | ICD-10-CM | POA: Diagnosis not present

## 2020-08-13 LAB — BASIC METABOLIC PANEL
Anion gap: 12 (ref 5–15)
BUN: 60 mg/dL — ABNORMAL HIGH (ref 8–23)
CO2: 22 mmol/L (ref 22–32)
Calcium: 9.6 mg/dL (ref 8.9–10.3)
Chloride: 103 mmol/L (ref 98–111)
Creatinine, Ser: 3.56 mg/dL — ABNORMAL HIGH (ref 0.44–1.00)
GFR, Estimated: 12 mL/min — ABNORMAL LOW (ref >60)
Glucose, Bld: 128 mg/dL — ABNORMAL HIGH (ref 70–99)
Potassium: 3.9 mmol/L (ref 3.5–5.1)
Sodium: 137 mmol/L (ref 135–145)

## 2020-08-13 LAB — CBC
HCT: 30.3 % — ABNORMAL LOW (ref 36.0–46.0)
Hemoglobin: 9.8 g/dL — ABNORMAL LOW (ref 12.0–15.0)
MCH: 28.4 pg (ref 26.0–34.0)
MCHC: 32.3 g/dL (ref 30.0–36.0)
MCV: 87.8 fL (ref 80.0–100.0)
Platelets: 226 10*3/uL (ref 150–400)
RBC: 3.45 MIL/uL — ABNORMAL LOW (ref 3.87–5.11)
RDW: 12.6 % (ref 11.5–15.5)
WBC: 6.1 10*3/uL (ref 4.0–10.5)
nRBC: 0 % (ref 0.0–0.2)

## 2020-08-13 LAB — GLUCOSE, CAPILLARY
Glucose-Capillary: 70 mg/dL (ref 70–99)
Glucose-Capillary: 168 mg/dL — ABNORMAL HIGH (ref 70–99)
Glucose-Capillary: 160 mg/dL — ABNORMAL HIGH (ref 70–99)
Glucose-Capillary: 243 mg/dL — ABNORMAL HIGH (ref 70–99)

## 2020-08-13 LAB — HEMOGLOBIN A1C
Hgb A1c MFr Bld: 9.1 % — ABNORMAL HIGH (ref 4.8–5.6)
Mean Plasma Glucose: 214.47 mg/dL

## 2020-08-13 LAB — MAGNESIUM: Magnesium: 2.2 mg/dL (ref 1.7–2.4)

## 2020-08-13 MED ORDER — ATENOLOL 25 MG PO TABS
25.0000 mg | ORAL_TABLET | Freq: Every day | ORAL | Status: DC
  Administered 2020-08-14: 25 mg via ORAL
  Filled 2020-08-13: qty 1

## 2020-08-13 MED ORDER — HYDRALAZINE HCL 20 MG/ML IJ SOLN: 5.0000 mg | Freq: Four times a day (QID) | INTRAMUSCULAR | Status: DC | PRN

## 2020-08-13 NOTE — Progress Notes (Signed)
PROGRESS NOTE   Abigail Huang  ONG:[id number] DOB: 18-Aug-1930 DOA: 08/12/2020 PCP: Burnard Bunting, MD  Brief Narrative:  87 year old HTN DM TY 2+ neuropathy CKD 4 baseline creatinine  3 range Prior right hip fracture 2018 Admit with progressive SLE several weeks despite increased Lasix in the outpatient setting  Afebrile, creatinine 3.6, albumin 3.8 CXR small right pleural effusion atelectasis  Hospital-Problem based course  Mild decompensated heart failure Hold prior to admission Naprosyn Resume atenolol keep at lower dose 25 Continue amlodipine 10  Baseline weight 150-currently 160 and has JVD  Cardiorenal syndrome  CKD 4 Diurese given JVD, lower extremity edema Significant proteinuria but cannot use ACE Might need nephrology input prior to discharge She has seen Dr. Moshe Cipro in the outpatient several years ago lost to follow-up-tells me would consider dialysis-appreciate nephrology input in a.m.  Diabetes mellitus type 2 with complication mild hyperglycemia fasting neuropathy-A1c 9.1 Hold Lantus 5 as hypoglycemic this morning  Sugars 70-1 68  Prior hip fractures  Hypothyroid  50 mcg Synthroid     DVT prophylaxis: Heparin every 8 Code Status: Full Family Communication: None present today Disposition:  Status is: Inpatient  Remains inpatient appropriate because:Hemodynamically unstable, Persistent severe electrolyte disturbances, Ongoing active pain requiring inpatient pain management, Unsafe d/c plan and IV treatments appropriate due to intensity of illness or inability to take PO   Dispo: The patient is from: Home              Anticipated d/c is to: Home              Patient currently is not medically stable to d/c.   Difficult to place patient No   Consultants:   Nephrology None Procedures: None as yet  Antimicrobials: None currently   Subjective: Doing fair less short of breath not on oxygen No chest pain no fever Tells me lives at  home with her husband who has mild cognitive issues but patient is pretty independent at baseline-stopped driving mainly because of her neuropathy from diabetes  Objective: Vitals:   08/12/20 2244 08/13/20 0247 08/13/20 0605 08/13/20 1503  BP: (!) 154/63 (!) 137/56 (!) 147/54 (!) 160/61  Pulse: 66 61 60 67  Resp: 15 16 16 18   Temp: 97.7 F (36.5 C) 98.1 F (36.7 C) 98 F (36.7 C) 98.4 F (36.9 C)  TempSrc: Oral Oral Oral Oral  SpO2: 99% 99% 98% 98%  Weight:      Height:        Intake/Output Summary (Last 24 hours) at 08/13/2020 1639 Last data filed at 08/13/2020 1516 Gross per 24 hour  Intake 120 ml  Output 4200 ml  Net -4080 ml   Filed Weights   08/12/20 1258  Weight: 73.9 kg    Examination:  Awake coherent no distress EOMI NCAT no focal deficit JVD at 30 degrees S1-S2 no murmur telemetry reviewed No rales rhonchi Mild pedal edema Neurologically intact Psych euthymic no focal deficit   Data Reviewed: personally reviewed   CBC    Component Value Date/Time   WBC 6.1 08/13/2020 0417   RBC 3.45 (L) 08/13/2020 0417   HGB 9.8 (L) 08/13/2020 0417   HCT 30.3 (L) 08/13/2020 0417   PLT 226 08/13/2020 0417   MCV 87.8 08/13/2020 0417   MCH 28.4 08/13/2020 0417   MCHC 32.3 08/13/2020 0417   RDW 12.6 08/13/2020 0417   LYMPHSABS 1.0 08/12/2020 1312   MONOABS 0.7 08/12/2020 1312   EOSABS 0.1 08/12/2020 1312   BASOSABS 0.0 08/12/2020  1312   CMP Latest Ref Rng & Units 08/13/2020 08/12/2020 07/12/2020  Glucose 70 - 99 mg/dL 128(H) 223(H) 295(H)  BUN 8 - 23 mg/dL 60(H) 62(H) 67(H)  Creatinine 0.44 - 1.00 mg/dL 3.56(H) 3.63(H) 3.87(H)  Sodium 135 - 145 mmol/L 137 129(L) 128(L)  Potassium 3.5 - 5.1 mmol/L 3.9 4.2 4.3  Chloride 98 - 111 mmol/L 103 97(L) 95(L)  CO2 22 - 32 mmol/L 22 19(L) 20(L)  Calcium 8.9 - 10.3 mg/dL 9.6 9.4 9.3  Total Protein 6.5 - 8.1 g/dL - 7.2 -  Total Bilirubin 0.3 - 1.2 mg/dL - 0.5 -  Alkaline Phos 38 - 126 U/L - 67 -  AST 15 - 41 U/L - 21 -  ALT 0  - 44 U/L - 27 -     Radiology Studies: DG Chest Portable 1 View  Result Date: 08/12/2020 CLINICAL DATA:  Increased shortness of breath for few months, supposed to start dialysis, 10 pound weight gain, CHF, diabetes mellitus, hypertension, chronic kidney disease EXAM: PORTABLE CHEST 1 VIEW COMPARISON:  Portable exam 1302 hours compared to 07/12/2020 FINDINGS: Upper normal size of cardiac silhouette. Mediastinal contours and pulmonary vascularity normal. Atherosclerotic calcification aorta. Small RIGHT pleural effusion and RIGHT basilar atelectasis. Central peribronchial thickening. No acute infiltrate or pneumothorax. Bones demineralized. IMPRESSION: Bronchitic changes with small RIGHT pleural effusion and RIGHT basilar atelectasis. Electronically Signed   By: Lavonia Dana M.D.   On: 08/12/2020 13:19     Scheduled Meds: . [START ON 08/14/2020] atenolol  25 mg Oral Daily  . furosemide  80 mg Intravenous BID  . heparin  5,000 Units Subcutaneous Q8H  . insulin aspart  0-4 Units Subcutaneous QHS  . insulin aspart  0-6 Units Subcutaneous TID WC  . levothyroxine  50 mcg Oral Q0600  . simvastatin  20 mg Oral Daily   Continuous Infusions: . sodium chloride       LOS: 1 day   Time spent: [place], MD [facility] To contact the attending provider between 7A-7P or the covering provider during after hours 7P-7A, please log into the web site [email or web address] and access using universal [place] password for that web site. If you do not have the password, please call the hospital operator.  08/13/2020, 4:39 PM

## 2020-08-14 ENCOUNTER — Inpatient Hospital Stay (HOSPITAL_COMMUNITY): Payer: Medicare HMO

## 2020-08-14 DIAGNOSIS — N185 Chronic kidney disease, stage 5: Secondary | ICD-10-CM | POA: Diagnosis not present

## 2020-08-14 DIAGNOSIS — I10 Essential (primary) hypertension: Secondary | ICD-10-CM | POA: Diagnosis not present

## 2020-08-14 DIAGNOSIS — E039 Hypothyroidism, unspecified: Secondary | ICD-10-CM | POA: Diagnosis not present

## 2020-08-14 DIAGNOSIS — I5033 Acute on chronic diastolic (congestive) heart failure: Secondary | ICD-10-CM | POA: Diagnosis not present

## 2020-08-14 LAB — CBC
HCT: 35.4 % — ABNORMAL LOW (ref 36.0–46.0)
Hemoglobin: 11.1 g/dL — ABNORMAL LOW (ref 12.0–15.0)
MCH: 28 pg (ref 26.0–34.0)
MCHC: 31.4 g/dL (ref 30.0–36.0)
MCV: 89.2 fL (ref 80.0–100.0)
Platelets: 260 10*3/uL (ref 150–400)
RBC: 3.97 MIL/uL (ref 3.87–5.11)
RDW: 12.6 % (ref 11.5–15.5)
WBC: 8.5 10*3/uL (ref 4.0–10.5)
nRBC: 0 % (ref 0.0–0.2)

## 2020-08-14 LAB — BASIC METABOLIC PANEL
Anion gap: 12 (ref 5–15)
BUN: 61 mg/dL — ABNORMAL HIGH (ref 8–23)
CO2: 22 mmol/L (ref 22–32)
Calcium: 9.8 mg/dL (ref 8.9–10.3)
Chloride: 100 mmol/L (ref 98–111)
Creatinine, Ser: 3.6 mg/dL — ABNORMAL HIGH (ref 0.44–1.00)
GFR, Estimated: 12 mL/min — ABNORMAL LOW (ref >60)
Glucose, Bld: 157 mg/dL — ABNORMAL HIGH (ref 70–99)
Potassium: 3.7 mmol/L (ref 3.5–5.1)
Sodium: 134 mmol/L — ABNORMAL LOW (ref 135–145)

## 2020-08-14 LAB — GLUCOSE, CAPILLARY
Glucose-Capillary: 148 mg/dL — ABNORMAL HIGH (ref 70–99)
Glucose-Capillary: 205 mg/dL — ABNORMAL HIGH (ref 70–99)
Glucose-Capillary: 203 mg/dL — ABNORMAL HIGH (ref 70–99)
Glucose-Capillary: 254 mg/dL — ABNORMAL HIGH (ref 70–99)

## 2020-08-14 LAB — ECHOCARDIOGRAM COMPLETE
Area-P 1/2: 2.69 cm2
Height: 66 in
S' Lateral: 3.2 cm
Weight: 2455.04 oz

## 2020-08-14 LAB — TSH: TSH: 8.343 u[IU]/mL — ABNORMAL HIGH (ref 0.350–4.500)

## 2020-08-14 LAB — MAGNESIUM: Magnesium: 2.2 mg/dL (ref 1.7–2.4)

## 2020-08-14 MED ORDER — HYDRALAZINE HCL 25 MG PO TABS
25.0000 mg | ORAL_TABLET | Freq: Two times a day (BID) | ORAL | Status: DC
  Administered 2020-08-14 (×2): 25 mg via ORAL
  Filled 2020-08-14 (×2): qty 1

## 2020-08-14 MED ORDER — ATENOLOL 50 MG PO TABS
50.0000 mg | ORAL_TABLET | Freq: Every day | ORAL | Status: DC
  Administered 2020-08-15: 50 mg via ORAL
  Filled 2020-08-14: qty 1

## 2020-08-14 NOTE — Progress Notes (Addendum)
PROGRESS NOTE    Abigail Huang  MGQ:[id number] DOB: 07-Aug-1930 DOA: 08/12/2020 PCP: Burnard Bunting, MD    Chief Complaint  Patient presents with  . Shortness of Breath    Brief Narrative:  Patient is a 87 year old female, history of type 2 diabetes with neuropathy, chronic kidney disease stage IV with baseline creatinine approximately 3 range, hypertension, hypothyroidism admitted for progressive shortness of breath for several weeks with increased lower extremity edema despite increased oral Lasix in the outpatient setting.  Chest x-ray with right pleural effusion, atelectasis.  Patient placed on IV Lasix with good urine output.  Nephrology consulted.   Assessment & Plan:   Principal Problem:   Acute on chronic diastolic CHF (congestive heart failure) (HCC) Active Problems:   Insulin-requiring or dependent type II diabetes mellitus (HCC)   Hyponatremia   CKD (chronic kidney disease), stage V (HCC)   Essential hypertension   Hypothyroidism  1 acute on chronic diastolic CHF Patient presented with progressive shortness of breath on exertion and lower extremity edema despite increased doses of oral Lasix.  Patient noted to be volume overloaded on examination with lower extremity edema on admission as well as positive JVD.  Concern for worsening renal function.  BNP elevated at 814.9.  Patient currently on Lasix 80 mg IV every 12 hours with a urine output of 3.3 L in the past 24 hours.  Patient is -6.180 L during this hospitalization.  Current weight of 153.44 pounds from 163 pounds.  Patient still with positive JVD.  Improvement with lower extremity edema.  Check a 2D echo.  Continue IV Lasix.  Strict I's and O's.  Daily weights.  Nephrology consulted and are following.  2.  Chronic kidney disease stage IV/V Patient with advanced chronic kidney disease stage IV/V and noted to have seen nephrology in the outpatient setting approximately a year ago.  Patient presenting with volume  overload and concern for worsening renal function.  Patient is not uremic, not acidotic.  Patient with good urine output on current dose of IV Lasix.  Nephrology consulted and are following.  Will likely need outpatient follow-up with nephrology post discharge.  3.  Diabetes mellitus type 2 with neuropathy Hemoglobin A1c 9.1(08/13/2020).  Patient noted to have a hypoglycemic episode and as such long-acting Lantus has been held.  CBG of 148 this morning.  Patient noted to be on Levemir 15 units daily.  Follow CBGs and could likely resume home regimen long-acting insulin of Levemir at 5 to 7 units tomorrow pending CBGs.  4.  Hypertension On IV Lasix.  Start hydralazine 25 mg twice daily and uptitrate as needed for better blood pressure control.  Increase atenolol back to home dose of 50 mg daily. Follow.  5.  Hypothyroidism TSH at 8.343.  Continue home regimen Synthroid.  Outpatient follow-up, for repeat labs.  6.  Anemia Iron studies ordered per nephrology.  Patient with no overt bleeding.  Transfusion threshold hemoglobin < 7.  Follow H&H.   DVT prophylaxis: Heparin Code Status: Full Family Communication: Updated patient.  No family at bedside. Disposition:   Status is: Inpatient    Dispo: The patient is from: Home              Anticipated d/c is to: Home              Patient currently on IV Lasix, not medically stable for discharge.   Difficult to place patient no       Consultants:   Nephrology: Dr.  Moshe Cipro 08/14/2020  Procedures:  2D echo pending  Chest x-ray 08/12/2020    Antimicrobials:   None   Subjective: Patient sitting up in chair.  States lower extremity edema has improved significantly since being hospitalized.  Stated ambulated in the hallway with no significant shortness of breath.  Overall he is feeling better than on admission.  Asking when she is going to be able to go home.  Objective: Vitals:   08/13/20 1503 08/14/20 0342 08/14/20 0405 08/14/20  1236  BP: (!) 160/61 (!) 177/68  (!) 182/68  Pulse: 67 70  70  Resp: 18 16  19   Temp: 98.4 F (36.9 C) 98.4 F (36.9 C)    TempSrc: Oral Oral    SpO2: 98% 98%  100%  Weight:   69.6 kg   Height:        Intake/Output Summary (Last 24 hours) at 08/14/2020 1245 Last data filed at 08/14/2020 1112 Gross per 24 hour  Intake 720 ml  Output 4400 ml  Net -3680 ml   Filed Weights   08/12/20 1258 08/14/20 0405  Weight: 73.9 kg 69.6 kg    Examination:  General exam: Appears calm and comfortable  Respiratory system: Diffuse crackles noted.  Decreased breath sounds in the bases.  No wheezing.  Fair air movement.  Speaking in full sentences.   Cardiovascular system: S1 & S2 heard, RRR.  Positive JVD.  Trace bilateral lower extremity edema.  Gastrointestinal system: Abdomen is nondistended, soft and nontender. No organomegaly or masses felt. Normal bowel sounds heard. Central nervous system: Alert and oriented. No focal neurological deficits. Extremities: Symmetric 5 x 5 power. Skin: No rashes, lesions or ulcers Psychiatry: Judgement and insight appear normal. Mood & affect appropriate.     Data Reviewed: I have personally reviewed following labs and imaging studies  CBC: Recent Labs  Lab 08/12/20 1312 08/13/20 0417 08/14/20 0355  WBC 8.5 6.1 8.5  NEUTROABS 6.6  --   --   HGB 10.8* 9.8* 11.1*  HCT 33.0* 30.3* 35.4*  MCV 86.2 87.8 89.2  PLT 262 226 614    Basic Metabolic Panel: Recent Labs  Lab 08/12/20 1312 08/13/20 0417 08/14/20 0355  NA 129* 137 134*  K 4.2 3.9 3.7  CL 97* 103 100  CO2 19* 22 22  GLUCOSE 223* 128* 157*  BUN 62* 60* 61*  CREATININE 3.63* 3.56* 3.60*  CALCIUM 9.4 9.6 9.8  MG  --  2.2 2.2    GFR: Estimated Creatinine Clearance: 10.3 mL/min (A) (by C-G formula based on SCr of 3.6 mg/dL (H)).  Liver Function Tests: Recent Labs  Lab 08/12/20 1312  AST 21  ALT 27  ALKPHOS 67  BILITOT 0.5  PROT 7.2  ALBUMIN 3.8    CBG: Recent Labs  Lab  08/13/20 1130 08/13/20 1723 08/13/20 2100 08/14/20 0719 08/14/20 1111  GLUCAP 168* 160* 243* 148* 205*     Recent Results (from the past 240 hour(s))  Resp Panel by RT-PCR (Flu A&B, Covid) Nasopharyngeal Swab     Status: None   Collection Time: 08/12/20  1:12 PM   Specimen: Nasopharyngeal Swab; Nasopharyngeal(NP) swabs in vial transport medium  Result Value Ref Range Status   SARS Coronavirus 2 by RT PCR NEGATIVE NEGATIVE Final    Comment: (NOTE) SARS-CoV-2 target nucleic acids are NOT DETECTED.  The SARS-CoV-2 RNA is generally detectable in upper respiratory specimens during the acute phase of infection. The lowest concentration of SARS-CoV-2 viral copies this assay can detect is 138 copies/mL.  A negative result does not preclude SARS-Cov-2 infection and should not be used as the sole basis for treatment or other patient management decisions. A negative result may occur with  improper specimen collection/handling, submission of specimen other than nasopharyngeal swab, presence of viral mutation(s) within the areas targeted by this assay, and inadequate number of viral copies(<138 copies/mL). A negative result must be combined with clinical observations, patient history, and epidemiological information. The expected result is Negative.  Fact Sheet for Patients:  EntrepreneurPulse.com.au  Fact Sheet for Healthcare Providers:  IncredibleEmployment.be  This test is no t yet approved or cleared by the [place] FDA and  has been authorized for detection and/or diagnosis of SARS-CoV-2 by FDA under an Emergency Use Authorization (EUA). This EUA will remain  in effect (meaning this test can be used) for the duration of the COVID-19 declaration under Section 564(b)(1) of the Act, 21 U.S.C.section 360bbb-3(b)(1), unless the authorization is terminated  or revoked sooner.       Influenza A by PCR NEGATIVE NEGATIVE Final   Influenza B by  PCR NEGATIVE NEGATIVE Final    Comment: (NOTE) The Xpert Xpress SARS-CoV-2/FLU/RSV plus assay is intended as an aid in the diagnosis of influenza from Nasopharyngeal swab specimens and should not be used as a sole basis for treatment. Nasal washings and aspirates are unacceptable for Xpert Xpress SARS-CoV-2/FLU/RSV testing.  Fact Sheet for Patients: EntrepreneurPulse.com.au  Fact Sheet for Healthcare Providers: IncredibleEmployment.be  This test is not yet approved or cleared by the [place] FDA and has been authorized for detection and/or diagnosis of SARS-CoV-2 by FDA under an Emergency Use Authorization (EUA). This EUA will remain in effect (meaning this test can be used) for the duration of the COVID-19 declaration under Section 564(b)(1) of the Act, 21 U.S.C. section 360bbb-3(b)(1), unless the authorization is terminated or revoked.  Performed at [facility], [address] [place], [place] [address]          Radiology Studies: DG Chest Portable 1 View  Result Date: 08/12/2020 CLINICAL DATA:  Increased shortness of breath for few months, supposed to start dialysis, 10 pound weight gain, CHF, diabetes mellitus, hypertension, chronic kidney disease EXAM: PORTABLE CHEST 1 VIEW COMPARISON:  Portable exam 1302 hours compared to 07/12/2020 FINDINGS: Upper normal size of cardiac silhouette. Mediastinal contours and pulmonary vascularity normal. Atherosclerotic calcification aorta. Small RIGHT pleural effusion and RIGHT basilar atelectasis. Central peribronchial thickening. No acute infiltrate or pneumothorax. Bones demineralized. IMPRESSION: Bronchitic changes with small RIGHT pleural effusion and RIGHT basilar atelectasis. Electronically Signed   By: Lavonia Dana M.D.   On: 08/12/2020 13:19        Scheduled Meds: . atenolol  25 mg Oral Daily  . furosemide  80 mg Intravenous BID  . heparin  5,000 Units Subcutaneous Q8H  .  insulin aspart  0-4 Units Subcutaneous QHS  . insulin aspart  0-6 Units Subcutaneous TID WC  . levothyroxine  50 mcg Oral Q0600  . simvastatin  20 mg Oral Daily   Continuous Infusions: . sodium chloride       LOS: 2 days    Time spent: 40 minutes    Irine Seal, MD [facility]   To contact the attending provider between 7A-7P or the covering provider during after hours 7P-7A, please log into the web site [email or web address] and access using universal [place] password for that web site. If you do not have the password, please call the hospital operator.  08/14/2020, 12:45 PM

## 2020-08-14 NOTE — Consult Note (Signed)
[facility] Renal Consultation Note  Requesting MD: Grandville Silos  Indication for Consultation: ckd and volume overload  HPI:  Abigail Huang is a 87 y.o. female with past medical history significant for diabetes mellitus, hypertension, chronic diastolic heart failure as well as CKD with creatinine over 3 for the last year and a half.  Patient required hospitalization in October 2020 for volume overload that was managed medically.  After that, she started following up with me at [facility].  She managed to maintain her volume with oral Lasix and was not uremic.  The last time that I saw her was in May 2021.  She says that she just had to many doctors to go to so she stopped coming to me.  She has been in her usual state of health until approximately 2 weeks ago when noticed weight gain, dyspnea on exertion and edema.  She attempted to manage with increased dose of Lasix.  She tells me she was up to 120 mg daily but it was not effective.  Therefore, she presented to the emergency department on 2/28 with the symptoms.  Patient was admitted, diuresed with IV Lasix-overall 6 L negative according to ins and outs and 10 pounds weight loss.  She feels well, ambulated with PT today and was less short of breath.  She has had no appetite disturbance, no muscle cramping no itching.  Creatinine had stayed fairly stable at 3.6 although this indicates a GFR of 12.  Hemoglobin 11 and albumin 3.8.  Her outpatient medication list indicates she was on Naprosyn but she is not sure  Creatinine, Ser  Date/Time Value Ref Range Status  08/14/2020 03:55 AM 3.60 (H) 0.44 - 1.00 mg/dL Final  08/13/2020 04:17 AM 3.56 (H) 0.44 - 1.00 mg/dL Final  08/12/2020 01:12 PM 3.63 (H) 0.44 - 1.00 mg/dL Final  07/12/2020 12:30 PM 3.87 (H) 0.44 - 1.00 mg/dL Final  09/03/2019 12:00 PM 3.80 (H) 0.44 - 1.00 mg/dL Final  03/20/2019 05:44 AM 3.37 (H) 0.44 - 1.00 mg/dL Final  03/19/2019 03:52 AM 3.28 (H) 0.44 -  1.00 mg/dL Final  03/18/2019 04:01 AM 3.12 (H) 0.44 - 1.00 mg/dL Final  03/17/2019 03:58 PM 3.15 (H) 0.44 - 1.00 mg/dL Final  03/17/2019 11:48 AM 3.22 (H) 0.44 - 1.00 mg/dL Final  03/12/2019 03:03 AM 2.84 (H) 0.44 - 1.00 mg/dL Final  02/24/2019 02:22 PM 2.78 (H) 0.44 - 1.00 mg/dL Final  11/08/2016 11:35 AM 1.98 (H) 0.44 - 1.00 mg/dL Final  11/07/2016 11:17 AM 1.96 (H) 0.44 - 1.00 mg/dL Final  11/06/2016 10:00 AM 1.92 (H) 0.44 - 1.00 mg/dL Final  11/05/2016 04:00 AM 1.85 (H) 0.44 - 1.00 mg/dL Final  11/04/2016 07:51 AM 1.80 (H) 0.44 - 1.00 mg/dL Final  11/03/2016 07:42 PM 2.02 (H) 0.44 - 1.00 mg/dL Final  02/04/2014 11:36 AM 1.54 (H) 0.50 - 1.10 mg/dL Final  03/08/2012 03:53 PM 1.58 (H) 0.50 - 1.10 mg/dL Final     PMHx:   Past Medical History:  Diagnosis Date  . CHF (congestive heart failure) ([place])   . Diabetes mellitus   . Hyperlipidemia   . Hypertension   . Renal disorder     Past Surgical History:  Procedure Laterality Date  . ABDOMINAL HYSTERECTOMY    . ANTERIOR APPROACH HEMI HIP ARTHROPLASTY Right 11/04/2016   Procedure: ANTERIOR APPROACH HEMI HIP ARTHROPLASTY;  Surgeon: Dorna Leitz, MD;  Location: [facility];  Service: Orthopedics;  Laterality: Right;  . COLONOSCOPY    . KNEE  SURGERY    . POLYPECTOMY      Family Hx:  Family History  Problem Relation Age of Onset  . Heart disease Mother   . Heart disease Father   . Heart disease Sister   . Colon cancer Neg Hx   . Esophageal cancer Neg Hx   . Rectal cancer Neg Hx     Social History:  reports that she quit smoking about 53 years ago. She has never used smokeless tobacco. She reports that she does not drink alcohol and does not use drugs.  Allergies: No Known Allergies  Medications: Prior to Admission medications   Medication Sig Start Date End Date Taking? Authorizing Provider  amLODipine (NORVASC) 10 MG tablet Take 10 mg by mouth daily.   Yes [provider]  atenolol (TENORMIN) 50 MG tablet Take 50 mg  by mouth every evening.   Yes [provider]  furosemide (LASIX) 80 MG tablet Take 80 mg by mouth daily.   Yes [provider]  insulin detemir (LEVEMIR) 100 UNIT/ML injection Inject 15 Units into the skin daily.   Yes [provider]  levothyroxine (SYNTHROID) 50 MCG tablet Take 50 mcg by mouth daily. 06/18/20  Yes [provider]  simvastatin (ZOCOR) 20 MG tablet Take 20 mg by mouth daily.   Yes [provider]  furosemide (LASIX) 80 MG tablet Take 1 tablet (80 mg total) by mouth daily. Please take 80 mg twice a day for 5 days then continue 80 mg daily 03/21/19 04/20/19  Shelly Coss, MD  naproxen (NAPROSYN) 500 MG tablet Take 1 tablet (500 mg total) by mouth 2 (two) times daily as needed. Patient not taking: Reported on 08/12/2020 05/26/19   Isla Pence, MD  potassium chloride 20 MEQ TBCR Take 20 mEq by mouth daily. = Patient not taking: Reported on 08/12/2020 03/20/19   Shelly Coss, MD    I have reviewed the patient's current medications.  Labs:  Results for orders placed or performed during the hospital encounter of 08/12/20 (from the past 48 hour(s))  CBC with Differential     Status: Abnormal   Collection Time: 08/12/20  1:12 PM  Result Value Ref Range   WBC 8.5 4.0 - 10.5 K/uL   RBC 3.83 (L) 3.87 - 5.11 MIL/uL   Hemoglobin 10.8 (L) 12.0 - 15.0 g/dL   HCT 33.0 (L) 36.0 - 46.0 %   MCV 86.2 80.0 - 100.0 fL   MCH 28.2 26.0 - 34.0 pg   MCHC 32.7 30.0 - 36.0 g/dL   RDW 12.7 11.5 - 15.5 %   Platelets 262 150 - 400 K/uL   nRBC 0.0 0.0 - 0.2 %   Neutrophils Relative % 77 %   Neutro Abs 6.6 1.7 - 7.7 K/uL   Lymphocytes Relative 12 %   Lymphs Abs 1.0 0.7 - 4.0 K/uL   Monocytes Relative 9 %   Monocytes Absolute 0.7 0.1 - 1.0 K/uL   Eosinophils Relative 1 %   Eosinophils Absolute 0.1 0.0 - 0.5 K/uL   Basophils Relative 0 %   Basophils Absolute 0.0 0.0 - 0.1 K/uL   Immature Granulocytes 1 %   Abs Immature Granulocytes 0.05 0.00 - 0.07  K/uL    Comment: Performed at [facility], [address] [place], [place] [address]  Comprehensive metabolic panel     Status: Abnormal   Collection Time: 08/12/20  1:12 PM  Result Value Ref Range   Sodium 129 (L) 135 - 145  mmol/L   Potassium 4.2 3.5 - 5.1 mmol/L   Chloride 97 (L) 98 - 111 mmol/L   CO2 19 (L) 22 - 32 mmol/L   Glucose, Bld 223 (H) 70 - 99 mg/dL    Comment: Glucose reference range applies only to samples taken after fasting for at least 8 hours.   BUN 62 (H) 8 - 23 mg/dL   Creatinine, Ser 3.63 (H) 0.44 - 1.00 mg/dL   Calcium 9.4 8.9 - 10.3 mg/dL   Total Protein 7.2 6.5 - 8.1 g/dL   Albumin 3.8 3.5 - 5.0 g/dL   AST 21 15 - 41 U/L   ALT 27 0 - 44 U/L   Alkaline Phosphatase 67 38 - 126 U/L   Total Bilirubin 0.5 0.3 - 1.2 mg/dL   GFR, Estimated 12 (L) >60 mL/min    Comment: (NOTE) Calculated using the CKD-EPI Creatinine Equation (2021)    Anion gap 13 5 - 15    Comment: Performed at [facility], [address] [place], [place] [address]  Lipase, blood     Status: Abnormal   Collection Time: 08/12/20  1:12 PM  Result Value Ref Range   Lipase 67 (H) 11 - 51 U/L    Comment: Performed at [facility], [address] [place], [place] [address]  Urinalysis, Routine w reflex microscopic Urine, Clean Catch     Status: Abnormal   Collection Time: 08/12/20  1:12 PM  Result Value Ref Range   Color, Urine STRAW (A) YELLOW   APPearance CLOUDY (A) CLEAR   Specific Gravity, Urine 1.015 1.005 - 1.030   pH 6.0 5.0 - 8.0   Glucose, UA 100 (A) NEGATIVE mg/dL   Hgb urine dipstick SMALL (A) NEGATIVE   Bilirubin Urine NEGATIVE NEGATIVE   Ketones, ur NEGATIVE NEGATIVE mg/dL   Protein, ur >300 (A) NEGATIVE mg/dL   Nitrite NEGATIVE NEGATIVE   Leukocytes,Ua SMALL (A) NEGATIVE    Comment: Performed at [facility], [address] [place], [place] [address]  Brain natriuretic peptide     Status: Abnormal   Collection Time: 08/12/20   1:12 PM  Result Value Ref Range   B Natriuretic Peptide 814.9 (H) 0.0 - 100.0 pg/mL    Comment: Performed at [facility], [address] [place], [place] [address]  Troponin I (High Sensitivity)     Status: None   Collection Time: 08/12/20  1:12 PM  Result Value Ref Range   Troponin I (High Sensitivity) 14 <18 ng/L    Comment: (NOTE) Elevated high sensitivity troponin I (hsTnI) values and significant  changes across serial measurements may suggest ACS but many other  chronic and acute conditions are known to elevate hsTnI results.  Refer to the "Links" section for chest pain algorithms and additional  guidance. Performed at [facility], [address] [place], [place] [address]   Resp Panel by RT-PCR (Flu A&B, Covid) Nasopharyngeal Swab     Status: None   Collection Time: 08/12/20  1:12 PM   Specimen: Nasopharyngeal Swab; Nasopharyngeal(NP) swabs in vial transport medium  Result Value Ref Range   SARS Coronavirus 2 by RT PCR NEGATIVE NEGATIVE    Comment: (NOTE) SARS-CoV-2 target nucleic acids are NOT DETECTED.  The SARS-CoV-2 RNA is generally detectable in upper respiratory specimens during the acute phase of infection. The lowest concentration of SARS-CoV-2 viral copies this assay can detect is 138 copies/mL. A negative result  does not preclude SARS-Cov-2 infection and should not be used as the sole basis for treatment or other patient management decisions. A negative result may occur with  improper specimen collection/handling, submission of specimen other than nasopharyngeal swab, presence of viral mutation(s) within the areas targeted by this assay, and inadequate number of viral copies(<138 copies/mL). A negative result must be combined with clinical observations, patient history, and epidemiological information. The expected result is Negative.  Fact Sheet for Patients:  EntrepreneurPulse.com.au  Fact Sheet for Healthcare  Providers:  IncredibleEmployment.be  This test is no t yet approved or cleared by the [place] FDA and  has been authorized for detection and/or diagnosis of SARS-CoV-2 by FDA under an Emergency Use Authorization (EUA). This EUA will remain  in effect (meaning this test can be used) for the duration of the COVID-19 declaration under Section 564(b)(1) of the Act, 21 U.S.C.section 360bbb-3(b)(1), unless the authorization is terminated  or revoked sooner.       Influenza A by PCR NEGATIVE NEGATIVE   Influenza B by PCR NEGATIVE NEGATIVE    Comment: (NOTE) The Xpert Xpress SARS-CoV-2/FLU/RSV plus assay is intended as an aid in the diagnosis of influenza from Nasopharyngeal swab specimens and should not be used as a sole basis for treatment. Nasal washings and aspirates are unacceptable for Xpert Xpress SARS-CoV-2/FLU/RSV testing.  Fact Sheet for Patients: EntrepreneurPulse.com.au  Fact Sheet for Healthcare Providers: IncredibleEmployment.be  This test is not yet approved or cleared by the [place] FDA and has been authorized for detection and/or diagnosis of SARS-CoV-2 by FDA under an Emergency Use Authorization (EUA). This EUA will remain in effect (meaning this test can be used) for the duration of the COVID-19 declaration under Section 564(b)(1) of the Act, 21 U.S.C. section 360bbb-3(b)(1), unless the authorization is terminated or revoked.  Performed at [facility], [address] [place], [place] [address]   Urinalysis, Microscopic (reflex)     Status: Abnormal   Collection Time: 08/12/20  1:12 PM  Result Value Ref Range   RBC / HPF 6-10 0 - 5 RBC/hpf   WBC, UA 11-20 0 - 5 WBC/hpf   Bacteria, UA MANY (A) NONE SEEN   Squamous Epithelial / LPF 0-5 0 - 5   WBC Clumps PRESENT     Comment: Performed at [facility], [address] [place], [place] [address]  Troponin I (High Sensitivity)      Status: None   Collection Time: 08/12/20  7:08 PM  Result Value Ref Range   Troponin I (High Sensitivity) 14 <18 ng/L    Comment: (NOTE) Elevated high sensitivity troponin I (hsTnI) values and significant  changes across serial measurements may suggest ACS but many other  chronic and acute conditions are known to elevate hsTnI results.  Refer to the "Links" section for chest pain algorithms and additional  guidance. Performed at [facility], [place] [address], [place], [place] [address]   Glucose, capillary     Status: Abnormal   Collection Time: 08/12/20  9:29 PM  Result Value Ref Range   Glucose-Capillary 185 (H) 70 - 99 mg/dL    Comment: Glucose reference range applies only to samples taken after fasting for at least 8 hours.  Basic metabolic panel     Status: Abnormal   Collection Time: 08/13/20  4:17 AM  Result Value Ref Range   Sodium 137 135 - 145 mmol/L   Potassium 3.9 3.5 - 5.1 mmol/L  Chloride 103 98 - 111 mmol/L   CO2 22 22 - 32 mmol/L   Glucose, Bld 128 (H) 70 - 99 mg/dL    Comment: Glucose reference range applies only to samples taken after fasting for at least 8 hours.   BUN 60 (H) 8 - 23 mg/dL   Creatinine, Ser 3.56 (H) 0.44 - 1.00 mg/dL   Calcium 9.6 8.9 - 10.3 mg/dL   GFR, Estimated 12 (L) >60 mL/min    Comment: (NOTE) Calculated using the CKD-EPI Creatinine Equation (2021)    Anion gap 12 5 - 15    Comment: Performed at [facility], [place] [address], [place], [place] [address]  Hemoglobin A1c     Status: Abnormal   Collection Time: 08/13/20  4:17 AM  Result Value Ref Range   Hgb A1c MFr Bld 9.1 (H) 4.8 - 5.6 %    Comment: (NOTE) Pre diabetes:          5.7%-6.4%  Diabetes:              >6.4%  Glycemic control for   <7.0% adults with diabetes    Mean Plasma Glucose 214.47 mg/dL    Comment: Performed at [place] [address] [place], [place] [address]  Magnesium     Status: None   Collection Time:  08/13/20  4:17 AM  Result Value Ref Range   Magnesium 2.2 1.7 - 2.4 mg/dL    Comment: Performed at [facility], [place] [address] [place], [place] [address]  CBC     Status: Abnormal   Collection Time: 08/13/20  4:17 AM  Result Value Ref Range   WBC 6.1 4.0 - 10.5 K/uL   RBC 3.45 (L) 3.87 - 5.11 MIL/uL   Hemoglobin 9.8 (L) 12.0 - 15.0 g/dL   HCT 30.3 (L) 36.0 - 46.0 %   MCV 87.8 80.0 - 100.0 fL   MCH 28.4 26.0 - 34.0 pg   MCHC 32.3 30.0 - 36.0 g/dL   RDW 12.6 11.5 - 15.5 %   Platelets 226 150 - 400 K/uL   nRBC 0.0 0.0 - 0.2 %    Comment: Performed at [facility], [place] [address], [place], [place] [address]  Glucose, capillary     Status: None   Collection Time: 08/13/20  7:53 AM  Result Value Ref Range   Glucose-Capillary 70 70 - 99 mg/dL    Comment: Glucose reference range applies only to samples taken after fasting for at least 8 hours.  Glucose, capillary     Status: Abnormal   Collection Time: 08/13/20 11:30 AM  Result Value Ref Range   Glucose-Capillary 168 (H) 70 - 99 mg/dL    Comment: Glucose reference range applies only to samples taken after fasting for at least 8 hours.  Glucose, capillary     Status: Abnormal   Collection Time: 08/13/20  5:23 PM  Result Value Ref Range   Glucose-Capillary 160 (H) 70 - 99 mg/dL    Comment: Glucose reference range applies only to samples taken after fasting for at least 8 hours.  Glucose, capillary     Status: Abnormal   Collection Time: 08/13/20  9:00 PM  Result Value Ref Range   Glucose-Capillary 243 (H) 70 - 99 mg/dL    Comment: Glucose reference range applies only to samples taken after fasting for at least 8 hours.  Basic metabolic panel     Status: Abnormal   Collection Time: 08/14/20  3:55 AM  Result Value Ref Range   Sodium 134 (L) 135 - 145 mmol/L   Potassium 3.7 3.5 - 5.1 mmol/L   Chloride 100 98 - 111 mmol/L   CO2 22 22 - 32 mmol/L   Glucose, Bld 157 (H) 70 - 99 mg/dL    Comment:  Glucose reference range applies only to samples taken after fasting for at least 8 hours.   BUN 61 (H) 8 - 23 mg/dL   Creatinine, Ser 3.60 (H) 0.44 - 1.00 mg/dL   Calcium 9.8 8.9 - 10.3 mg/dL   GFR, Estimated 12 (L) >60 mL/min    Comment: (NOTE) Calculated using the CKD-EPI Creatinine Equation (2021)    Anion gap 12 5 - 15    Comment: Performed at [facility], [place] [address], [place], [place] [address]  Magnesium     Status: None   Collection Time: 08/14/20  3:55 AM  Result Value Ref Range   Magnesium 2.2 1.7 - 2.4 mg/dL    Comment: Performed at [facility], [place] [address], [place], [place] [address]  CBC     Status: Abnormal   Collection Time: 08/14/20  3:55 AM  Result Value Ref Range   WBC 8.5 4.0 - 10.5 K/uL   RBC 3.97 3.87 - 5.11 MIL/uL   Hemoglobin 11.1 (L) 12.0 - 15.0 g/dL   HCT 35.4 (L) 36.0 - 46.0 %   MCV 89.2 80.0 - 100.0 fL   MCH 28.0 26.0 - 34.0 pg   MCHC 31.4 30.0 - 36.0 g/dL   RDW 12.6 11.5 - 15.5 %   Platelets 260 150 - 400 K/uL   nRBC 0.0 0.0 - 0.2 %    Comment: Performed at [facility], [place] [address] [place], [place] [address]  Glucose, capillary     Status: Abnormal   Collection Time: 08/14/20  7:19 AM  Result Value Ref Range   Glucose-Capillary 148 (H) 70 - 99 mg/dL    Comment: Glucose reference range applies only to samples taken after fasting for at least 8 hours.  TSH     Status: Abnormal   Collection Time: 08/14/20  9:21 AM  Result Value Ref Range   TSH 8.343 (H) 0.350 - 4.500 uIU/mL    Comment: Performed by a 3rd Generation assay with a functional sensitivity of <=0.01 uIU/mL. Performed at [facility], [place] [address], [place], [place] [address]   Glucose, capillary     Status: Abnormal   Collection Time: 08/14/20 11:11 AM  Result Value Ref Range   Glucose-Capillary 205 (H) 70 - 99 mg/dL    Comment: Glucose reference range applies only to samples taken after fasting for  at least 8 hours.     ROS:  A comprehensive review of systems was negative except for: Respiratory: positive for dyspnea on exertion Cardiovascular: positive for lower extremity edema  Physical Exam: Vitals:   08/13/20 1503 08/14/20 0342  BP: (!) 160/61 (!) 177/68  Pulse: 67 70  Resp: 18 16  Temp: 98.4 F (36.9 C) 98.4 F (36.9 C)  SpO2: 98% 98%     General: Well-appearing elderly white female eating lunch without assistance HEENT: Was are equal round reactive to light, extraocular motions are intact, mucous membranes are moist Neck: No JVD appreciated Heart: Regular rate and rhythm Lungs: Decreased breath sounds at the bases Abdomen: Soft, nontender, nondistended Extremities: Pitting edema to dependent areas Skin: Warm and dry, some abrasions Neuro: Alert and nonfocal  Assessment/Plan: 87 year old white female with diabetes, hypertension and known stage IV/V CKD-seen by me at [place] in the past.  She has had 1 hospitalization in 2020 for volume overload and now returns for same 1.Renal-  advanced CKD that actually maintained for the last year and a half.  I last saw her in the office almost a year ago.  She was not uremic at that time and is not uremic now.  His volume overload has not been a persistent, persistent issue.  She is responding to IV diuresis without change in renal function so far this hospitalization.  She does not need dialysis need at this time.  The fact that her albumin and hemoglobin are good speaks to her overall health also.  We would all like to continue to medically manage.  I had discussions with the patient and her daughter as outpatient wanted to make sure that they understood the implications of being started on dialysis, likely taking her quality of life to an unacceptable level for her.  These discussions will be continued.  Patient agrees to continue following up with me for further discussions on this matter 2. Hypertension/volume  -blood pressure high  and is volume overloaded.  So far is responding to IV Lasix.  Would continue this at least another 24 hours.  We also might consider increasing hydralazine as the presence of hypertension might be leading to some of the issue given her chronic diastolic heart failure.  Will likely need a discharge dose of Lasix at least 120 mg/day (80/40)  3. Anemia  -not an issue at this time-since in-house I will check iron stores so I can give her IV iron if she needs it   Louis Meckel 08/14/2020, 12:19 PM

## 2020-08-14 NOTE — Evaluation (Signed)
Physical Therapy Evaluation Patient Details Name: Abigail Huang MRN: [id number] DOB: July 22, 1930 Today's Date: 08/14/2020        Clinical Impression  Pt is an 87y.o. female with below HPI.  Pt reports that she is modified independent with use of SPC and rollator for community mobility at baseline. Pt was able to perform supine to sit with supervision for safety. Pt required MIN- MOD assist for power up and stability with sit to stand transfers. Pt required 1 person HHA when ambulating in room for safety and stability as she was unsteady. Pt required MIN assist- MIN guard for ambulation in hallway with use of RW with no LOB. Pt will have assistance from her husband at home. Recommend home with family support and HHPT. Pt will benefit from skilled PT to increase independence and safety with mobility. Acute therapy to follow up during stay to progress toward acute therapy goals.        08/14/20 1100  PT Visit Information  Last PT Received On 08/14/20  Assistance Needed +1  History of Present Illness Patient is a 87 y.o. female with PMH significant for DM, HTN, chronic diastolic heart failure as well as CKD, Rt Hip Hemi in 2018 2/2 fall and fracture. Pt presented with ~2 wks of SOB, weight gain, and edema. Pt admitted for acute on chronic CHF.  Precautions  Precautions Fall  Restrictions  Weight Bearing Restrictions No  Home Living  Family/patient expects to be discharged to: Private residence  Living Arrangements Spouse/significant other  Available Help at Discharge Family  Type of [place] to enter  Entrance Stairs-Number of Steps 2  [place] One level  [place] - 4 wheels;Kasandra Knudsen - single point  Additional Comments pt reports husband and neighbor help her with household chores at home because she becomes fatigued/SOB easily.  Prior Function  Level of Independence Independent with assistive device(s)  Comments Pt  staes that she does not use assistive device for hosuehold ambulation but uses SPC in the community for shorter distances and rollator for long distances. Pt has history of falls but reports last fall was 2 years ago. (Simultaneous filing. User may not have seen previous data.)  Communication  Communication No difficulties  Pain Assessment  Pain Assessment Faces  Faces Pain Scale 4  Pain Location Rt calf (cramp)  Pain Descriptors / Indicators Cramping;Discomfort  Pain Intervention(s) Limited activity within patient's tolerance;Monitored during session  Cognition  Arousal/Alertness Awake/alert  Behavior During Therapy WFL for tasks assessed/performed  Overall Cognitive Status Within Functional Limits for tasks assessed  Upper Extremity Assessment  Upper Extremity Assessment Overall WFL for tasks assessed  Lower Extremity Assessment  Lower Extremity Assessment Generalized weakness  Cervical / Trunk Assessment  Cervical / Trunk Assessment Normal  Bed Mobility  Overal bed mobility Needs Assistance  Bed Mobility Supine to Sit  Supine to sit Supervision;HOB elevated  General bed mobility comments use of UEs to scoot to EOB with supervision for safety  Transfers  Overall transfer level Needs assistance  Equipment used Rolling walker (2 wheeled);1 person hand held assist  Transfers Sit to/from Stand  Sit to Stand Min assist;Mod assist  General transfer comment MOD assist from EOB for safety and power up to stand without use of RW. MIN assist for power up and stability with cues for safe hand placement from toilet with use of RW  Ambulation/Gait  Ambulation/Gait assistance Min guard;Min assist  Gait Distance (Feet) 200  Feet  Assistive device Rolling walker (2 wheeled);1 person hand held assist  General Gait Details pt ambualted to bathroom with 1 person HHA for safety and stabilty as pt was unsteady. MIN assist for safety and stability with ambulation in hallway with use of RW with cues to  maintain safe proximity to RW.  Gait velocity decr  Balance  Overall balance assessment Needs assistance  Sitting-balance support Feet supported  Sitting balance-Leahy Scale Fair  Standing balance support During functional activity;Single extremity supported;Bilateral upper extremity supported  Standing balance-Leahy Scale Fair  Standing balance comment pt able to stand with MIN assist-MIN guard from therapist to perform hand hygiene.  General Comments  General comments (skin integrity, edema, etc.) pt noted to have small skin tear on Lt elbow from bumping grab bar with descent to toilet, RN aware and applied dressing during session  PT - End of Session  Equipment Utilized During Treatment Gait belt  Activity Tolerance Patient tolerated treatment well  Patient left in chair;with chair alarm set  Nurse Communication Mobility status  PT Assessment  PT Recommendation/Assessment Patient needs continued PT services  PT Visit Diagnosis Unsteadiness on feet (R26.81);Muscle weakness (generalized) (M62.81);History of falling (Z91.81)  PT Problem List Decreased strength;Decreased activity tolerance;Decreased balance;Decreased mobility;Decreased knowledge of use of DME  PT Plan  PT Frequency (ACUTE ONLY) Min 3X/week  PT Treatment/Interventions (ACUTE ONLY) DME instruction;Gait training;Functional mobility training;Therapeutic exercise;Therapeutic activities;Balance training;Patient/family education;Stair training  AM-PAC PT "6 Clicks" Mobility Outcome Measure (Version 2)  Help needed turning from your back to your side while in a flat bed without using bedrails? 4  Help needed moving from lying on your back to sitting on the side of a flat bed without using bedrails? 4  Help needed moving to and from a bed to a chair (including a wheelchair)? 3  Help needed standing up from a chair using your arms (e.g., wheelchair or bedside chair)? 3  Help needed to walk in hospital room? 3  Help needed climbing  3-5 steps with a railing?  2  6 Click Score 19  Consider Recommendation of Discharge To: [facility] with [facility]  PT Recommendation  Follow Up Recommendations Home health PT  PT equipment None recommended by PT (pt owns rollator)  Individuals Consulted  Consulted and Agree with Results and Recommendations Patient  Acute Rehab PT Goals  Patient Stated Goal none stated  PT Goal Formulation With patient  Time For Goal Achievement 08/28/20  Potential to Achieve Goals Good  PT Time Calculation  PT Start Time (ACUTE ONLY) 1138  PT Stop Time (ACUTE ONLY) 1208  PT Time Calculation (min) (ACUTE ONLY) 30 min  Written Expression  Dominant Hand Right        Abigail Huang, SPT  Acute rehab    Abigail Huang 08/14/2020, 4:36 PM

## 2020-08-14 NOTE — Progress Notes (Incomplete)
{  Select Note:[id number]} 

## 2020-08-15 DIAGNOSIS — I5033 Acute on chronic diastolic (congestive) heart failure: Secondary | ICD-10-CM | POA: Diagnosis not present

## 2020-08-15 DIAGNOSIS — N185 Chronic kidney disease, stage 5: Secondary | ICD-10-CM | POA: Diagnosis not present

## 2020-08-15 DIAGNOSIS — E871 Hypo-osmolality and hyponatremia: Secondary | ICD-10-CM | POA: Diagnosis not present

## 2020-08-15 DIAGNOSIS — I10 Essential (primary) hypertension: Secondary | ICD-10-CM | POA: Diagnosis not present

## 2020-08-15 LAB — RENAL FUNCTION PANEL
Albumin: 3.2 g/dL — ABNORMAL LOW (ref 3.5–5.0)
Anion gap: 12 (ref 5–15)
BUN: 55 mg/dL — ABNORMAL HIGH (ref 8–23)
CO2: 23 mmol/L (ref 22–32)
Calcium: 9.8 mg/dL (ref 8.9–10.3)
Chloride: 99 mmol/L (ref 98–111)
Creatinine, Ser: 3.57 mg/dL — ABNORMAL HIGH (ref 0.44–1.00)
GFR, Estimated: 12 mL/min — ABNORMAL LOW (ref >60)
Glucose, Bld: 156 mg/dL — ABNORMAL HIGH (ref 70–99)
Phosphorus: 5.4 mg/dL — ABNORMAL HIGH (ref 2.5–4.6)
Potassium: 3.8 mmol/L (ref 3.5–5.1)
Sodium: 134 mmol/L — ABNORMAL LOW (ref 135–145)

## 2020-08-15 LAB — IRON AND TIBC
Iron: 54 ug/dL (ref 28–170)
Saturation Ratios: 15 % (ref 10.4–31.8)
TIBC: 355 ug/dL (ref 250–450)
UIBC: 301 ug/dL

## 2020-08-15 LAB — CBC
HCT: 34.9 % — ABNORMAL LOW (ref 36.0–46.0)
Hemoglobin: 11.3 g/dL — ABNORMAL LOW (ref 12.0–15.0)
MCH: 28.3 pg (ref 26.0–34.0)
MCHC: 32.4 g/dL (ref 30.0–36.0)
MCV: 87.3 fL (ref 80.0–100.0)
Platelets: 271 10*3/uL (ref 150–400)
RBC: 4 MIL/uL (ref 3.87–5.11)
RDW: 12.4 % (ref 11.5–15.5)
WBC: 7.1 10*3/uL (ref 4.0–10.5)
nRBC: 0 % (ref 0.0–0.2)

## 2020-08-15 LAB — GLUCOSE, CAPILLARY
Glucose-Capillary: 160 mg/dL — ABNORMAL HIGH (ref 70–99)
Glucose-Capillary: 368 mg/dL — ABNORMAL HIGH (ref 70–99)
Glucose-Capillary: 224 mg/dL — ABNORMAL HIGH (ref 70–99)

## 2020-08-15 LAB — FERRITIN: Ferritin: 35 ng/mL (ref 11–307)

## 2020-08-15 LAB — MAGNESIUM: Magnesium: 1.9 mg/dL (ref 1.7–2.4)

## 2020-08-15 MED ORDER — FUROSEMIDE 40 MG PO TABS: 40.0000 mg | ORAL_TABLET | Freq: Every day | ORAL | 1 refills | Status: AC

## 2020-08-15 MED ORDER — SODIUM CHLORIDE 0.9 % IV SOLN
510.0000 mg | Freq: Once | INTRAVENOUS | Status: AC
  Administered 2020-08-15: 510 mg via INTRAVENOUS
  Filled 2020-08-15: qty 510

## 2020-08-15 MED ORDER — HYDRALAZINE HCL 25 MG PO TABS
25.0000 mg | ORAL_TABLET | Freq: Three times a day (TID) | ORAL | Status: DC
  Administered 2020-08-15 (×2): 25 mg via ORAL
  Filled 2020-08-15 (×2): qty 1

## 2020-08-15 MED ORDER — INSULIN DETEMIR 100 UNIT/ML ~~LOC~~ SOLN: 10.0000 [IU] | Freq: Every day | SUBCUTANEOUS | Status: DC

## 2020-08-15 MED ORDER — INSULIN DETEMIR 100 UNIT/ML ~~LOC~~ SOLN
5.0000 [IU] | Freq: Every day | SUBCUTANEOUS | Status: DC
  Administered 2020-08-15: 5 [IU] via SUBCUTANEOUS
  Filled 2020-08-15: qty 0.05

## 2020-08-15 MED ORDER — HYDRALAZINE HCL 25 MG PO TABS: 25.0000 mg | ORAL_TABLET | Freq: Three times a day (TID) | ORAL | 1 refills | Status: AC

## 2020-08-15 NOTE — TOC Progression Note (Signed)
Transition of Care [facility]) - Progression Note    Patient Details  Name: Abigail Huang MRN: [id number] Date of Birth: [birth year]  Transition of Care [facility]) CM/SW Contact  Purcell Mouton, RN Phone Number: 08/15/2020, 2:56 PM  Clinical Narrative:            Expected Discharge Plan and Services                                                 Social Determinants of Health (SDOH) Interventions    Readmission Risk Interventions No flowsheet data found.

## 2020-08-15 NOTE — Progress Notes (Signed)
Subjective:  3 liters negative last 24 hours but BP is still high - weight 145- renal function unchanged  Objective Vital signs in last 24 hours: Vitals:   08/14/20 1236 08/14/20 2101 08/15/20 0422 08/15/20 1017  BP: (!) 182/68 (!) 180/79 (!) 178/65   Pulse: 70 69 70   Resp: 19 18 20    Temp:  98.2 F (36.8 C) 98.3 F (36.8 C)   TempSrc:  Oral Oral   SpO2: 100% 98% 97%   Weight:    65.8 kg  Height:       Weight change:   Intake/Output Summary (Last 24 hours) at 08/15/2020 1308 Last data filed at 08/15/2020 0746 Gross per 24 hour  Intake 240 ml  Output 3000 ml  Net -2760 ml    Assessment/Plan: 87 year old white female with diabetes, hypertension and known stage IV/V CKD-seen by me at [place] in the past.  She has had 1 hospitalization in 2020 for volume overload and now returns for same 1.Renal-  advanced CKD that has maintained for the last year and a half.  I last saw her in the office almost a year ago.  She was not uremic at that time and is not uremic now.  Her volume overload has not been a persistent, persistent issue.  She is responding to IV diuresis without change in renal function so far this hospitalization.  She does not need dialysis need at this time.  The fact that her albumin and hemoglobin are good speaks to her overall health.  We would all like to continue to medically manage.  I had discussions with the patient and her daughter as outpatient wanted to make sure that they understood the implications of being started on dialysis, likely taking her quality of life to an unacceptable level for her.  These discussions will be continued.  Patient agrees to continue following up with me for further discussions on this matter 2. Hypertension/volume  -blood pressure high and is volume overloaded.  So far is responding to IV Lasix.  Would transition to PO.  agree with increasing hydralazine as the presence of hypertension might be leading to some of the issue given her chronic diastolic  heart failure.  Will likely need a discharge dose of Lasix at least 120 mg/day (80/40) -  Would change to that now 3. Anemia  -not an issue at this time-since in-house I will check iron stores so I can give her IV iron if she needs it- will give iron   D/w hosp-  Is ok to go home today after iron and second dose of IV lasix then will be on 80/40 at home starting tomorrow.  I will see her in the office soon     Louis Meckel    Labs: Basic Metabolic Panel: Recent Labs  Lab 08/13/20 0417 08/14/20 0355 08/15/20 0420  NA 137 134* 134*  K 3.9 3.7 3.8  CL 103 100 99  CO2 22 22 23   GLUCOSE 128* 157* 156*  BUN 60* 61* 55*  CREATININE 3.56* 3.60* 3.57*  CALCIUM 9.6 9.8 9.8  PHOS  --   --  5.4*   Liver Function Tests: Recent Labs  Lab 08/12/20 1312 08/15/20 0420  AST 21  --   ALT 27  --   ALKPHOS 67  --   BILITOT 0.5  --   PROT 7.2  --   ALBUMIN 3.8 3.2*   Recent Labs  Lab 08/12/20 1312  LIPASE 67*   No results  for input(s): AMMONIA in the last 168 hours. CBC: Recent Labs  Lab 08/12/20 1312 08/13/20 0417 08/14/20 0355 08/15/20 0420  WBC 8.5 6.1 8.5 7.1  NEUTROABS 6.6  --   --   --   HGB 10.8* 9.8* 11.1* 11.3*  HCT 33.0* 30.3* 35.4* 34.9*  MCV 86.2 87.8 89.2 87.3  PLT 262 226 260 271   Cardiac Enzymes: No results for input(s): CKTOTAL, CKMB, CKMBINDEX, TROPONINI in the last 168 hours. CBG: Recent Labs  Lab 08/14/20 1111 08/14/20 1658 08/14/20 2107 08/15/20 0750 08/15/20 1137  GLUCAP 205* 203* 254* 160* 368*    Iron Studies:  Recent Labs    08/15/20 0420  IRON 54  TIBC 355  FERRITIN 35   Studies/Results: ECHOCARDIOGRAM COMPLETE  Result Date: 08/14/2020    ECHOCARDIOGRAM REPORT   Patient Name:   MAYLYN NARVAIZ Date of Exam: 08/14/2020 Medical Rec #:  [id number]            Height:       66.0 in Accession #:    [id number]           Weight:       153.4 lb Date of Birth:  Dec 21, 1930            BSA:          1.787 m Patient Age:    19 years              BP:           177/68 mmHg Patient Gender: F                    HR:           70 bpm. Exam Location:  Inpatient Procedure: 2D Echo, Cardiac Doppler and Color Doppler Indications:    CHF  History:        Patient has prior history of Echocardiogram examinations, most                 recent 03/18/2019. CHF, Signs/Symptoms:Shortness of Breath and                 CKD; Risk Factors:Diabetes, Hypertension and Dyslipidemia.  Sonographer:    Dustin Flock Referring Phys: [place]  1. Left ventricular ejection fraction, by estimation, is 55 to 60%. The left ventricle has normal function. The left ventricle has no regional wall motion abnormalities. Left ventricular diastolic parameters are consistent with Grade II diastolic dysfunction (pseudonormalization).  2. Right ventricular systolic function is normal. The right ventricular size is mildly enlarged. There is mildly elevated pulmonary artery systolic pressure. The estimated right ventricular systolic pressure is 18.2 mmHg.  3. Left atrial size was mildly dilated.  4. The mitral valve is degenerative. Mild to moderate mitral valve regurgitation. Mild mitral stenosis. The mean mitral valve gradient is 4.0 mmHg. Moderate mitral annular calcification.  5. Tricuspid valve regurgitation is moderate.  6. The aortic valve is tricuspid. Aortic valve regurgitation is not visualized. Mild aortic valve sclerosis is present, with no evidence of aortic valve stenosis.  7. The inferior vena cava is normal in size with greater than 50% respiratory variability, suggesting right atrial pressure of 3 mmHg. FINDINGS  Left Ventricle: Left ventricular ejection fraction, by estimation, is 55 to 60%. The left ventricle has normal function. The left ventricle has no regional wall motion abnormalities. The left ventricular internal cavity size was normal in size. There is  no left ventricular hypertrophy. Left  ventricular diastolic parameters are consistent  with Grade II diastolic dysfunction (pseudonormalization). Right Ventricle: The right ventricular size is mildly enlarged. No increase in right ventricular wall thickness. Right ventricular systolic function is normal. There is mildly elevated pulmonary artery systolic pressure. The tricuspid regurgitant velocity is 3.03 m/s, and with an assumed right atrial pressure of 3 mmHg, the estimated right ventricular systolic pressure is 86.7 mmHg. Left Atrium: Left atrial size was mildly dilated. Right Atrium: Right atrial size was normal in size. Pericardium: There is no evidence of pericardial effusion. Mitral Valve: The mitral valve is degenerative in appearance. There is moderate calcification of the mitral valve leaflet(s). Moderate mitral annular calcification. Mild to moderate mitral valve regurgitation. Mild mitral valve stenosis. The mean mitral valve gradient is 4.0 mmHg. Tricuspid Valve: The tricuspid valve is normal in structure. Tricuspid valve regurgitation is moderate. Aortic Valve: The aortic valve is tricuspid. Aortic valve regurgitation is not visualized. Mild aortic valve sclerosis is present, with no evidence of aortic valve stenosis. Pulmonic Valve: The pulmonic valve was normal in structure. Pulmonic valve regurgitation is not visualized. Aorta: The aortic root is normal in size and structure. Venous: The inferior vena cava is normal in size with greater than 50% respiratory variability, suggesting right atrial pressure of 3 mmHg. IAS/Shunts: No atrial level shunt detected by color flow Doppler.  LEFT VENTRICLE PLAX 2D LVIDd:         4.30 cm  Diastology LVIDs:         3.20 cm  LV e' medial:    6.53 cm/s LV PW:         0.90 cm  LV E/e' medial:  17.5 LV IVS:        0.80 cm  LV e' lateral:   8.16 cm/s LVOT diam:     1.80 cm  LV E/e' lateral: 14.0 LV SV:         65 LV SV Index:   36 LVOT Area:     2.54 cm  RIGHT VENTRICLE RV Basal diam:  3.80 cm RV S prime:     3.81 cm/s TAPSE (M-mode): 2.9 cm LEFT  ATRIUM             Index       RIGHT ATRIUM           Index LA diam:        4.60 cm 2.57 cm/m  RA Area:     17.30 cm LA Vol (A2C):   49.6 ml 27.76 ml/m RA Volume:   48.00 ml  26.86 ml/m LA Vol (A4C):   63.3 ml 35.43 ml/m LA Biplane Vol: 61.1 ml 34.20 ml/m  AORTIC VALVE LVOT Vmax:   104.00 cm/s LVOT Vmean:  71.000 cm/s LVOT VTI:    0.254 m  AORTA Ao Root diam: 2.70 cm MITRAL VALVE                TRICUSPID VALVE MV Area (PHT): 2.69 cm     TR Peak grad:   36.7 mmHg MV Mean grad:  4.0 mmHg     TR Vmax:        303.00 cm/s MV Decel Time: 282 msec MV E velocity: 114.00 cm/s  SHUNTS MV A velocity: 106.00 cm/s  Systemic VTI:  0.25 m MV E/A ratio:  1.08         Systemic Diam: 1.80 cm Loralie Champagne MD Electronically signed by Loralie Champagne MD Signature Date/Time: 08/14/2020/3:56:05 PM    Final  Medications: Infusions: . sodium chloride      Scheduled Medications: . atenolol  50 mg Oral Daily  . furosemide  80 mg Intravenous BID  . heparin  5,000 Units Subcutaneous Q8H  . hydrALAZINE  25 mg Oral Q8H  . insulin aspart  0-4 Units Subcutaneous QHS  . insulin aspart  0-6 Units Subcutaneous TID WC  . insulin detemir  5 Units Subcutaneous Daily  . levothyroxine  50 mcg Oral Q0600  . simvastatin  20 mg Oral Daily    have reviewed scheduled and prn medications.  Physical Exam: General: sitting in bedside chair looks good Heart: RRR Lungs: moslty clear Abdomen: soft, non tender Extremities: no peripheral edema     08/15/2020,1:08 PM  LOS: 3 days

## 2020-08-15 NOTE — Care Management Important Message (Signed)
Important Message  Patient Details IM Letter given to the Patient. Name: Abigail Huang MRN: [id number] Date of Birth: 1930-08-07   Medicare Important Message Given:  Yes     Kerin Salen 08/15/2020, 8:23 AM

## 2020-08-15 NOTE — Plan of Care (Signed)
Discharge instructions reviewed with patient, questions answered, verbalized understanding. Also reviewed heart failure booklet with patient.  Awaiting ride from son at 6 pm.

## 2020-08-15 NOTE — TOC Progression Note (Signed)
Transition of Care [facility]) - Progression Note    Patient Details  Name: Abigail Huang MRN: [id number] Date of Birth: July 05, 1930  [facility]) CM/SW Contact  Purcell Mouton, RN Phone Number: 08/15/2020, 3:00 PM  Clinical Narrative:     Pt selected [facility] for HHRN/PT. Referral given to in house rep.  Expected Discharge Plan: [place] Barriers to Discharge: No Barriers Identified  Expected Discharge Plan and Services Expected Discharge Plan: [place] arrangements for the past 2 months: [place]: RN,PT [place] Agency: [facility] (now [facility]) Date [place]: 08/15/20 Time [place]: [place] Representative spoke with at [place]: [place]) Interventions    Readmission Risk Interventions No flowsheet data found.

## 2020-08-15 NOTE — Evaluation (Signed)
Occupational Therapy Evaluation Patient Details Name: Abigail Huang MRN: [id number] DOB: June 14, 1931 Today's Date: 08/15/2020    History of Present Illness Patient is a 87 y.o. female with PMH significant for DM, HTN, chronic diastolic heart failure as well as CKD, Rt Hip Hemi in 2018 2/2 fall and fracture. Pt presented with ~2 wks of SOB, weight gain, and edema. Pt admitted for acute on chronic CHF.   Clinical Impression   Mrs. Abigail Huang is an 87 year old woman admitted to hospital with CHF. On evaluation patient demonstrated normal ROM and strength of upper extremities, ability to perform bed transfers, ADLs and in room ambulation with RW. Patient appears to be at her baseline in regards to self care tasks. Patient typically doesn't use a RW at home but does well with device today. No OT needs at this time. Defer gait and balance training to PT.    Follow Up Recommendations  No OT follow up    Equipment Recommendations  None recommended by OT    Recommendations for Other Services       Precautions / Restrictions Precautions Precautions: Fall Restrictions Weight Bearing Restrictions: No      Mobility Bed Mobility Overal bed mobility: Modified Independent                  Transfers Overall transfer level: Needs assistance Equipment used: Rolling walker (2 wheeled) Transfers: Sit to/from Stand Sit to Stand: Min guard         General transfer comment: Min guard for standing and ambulation in room for safety.    Balance Overall balance assessment: Mild deficits observed, not formally tested                                         ADL either performed or assessed with clinical judgement   ADL Overall ADL's : At baseline                                             Vision Patient Visual Report: No change from baseline       Perception     Praxis      Pertinent Vitals/Pain Pain Assessment: No/denies pain      Hand Dominance Right   Extremity/Trunk Assessment Upper Extremity Assessment Upper Extremity Assessment: Overall WFL for tasks assessed   Lower Extremity Assessment Lower Extremity Assessment: Defer to PT evaluation   Cervical / Trunk Assessment Cervical / Trunk Assessment: Normal   Communication Communication Communication: No difficulties   Cognition Arousal/Alertness: Awake/alert Behavior During Therapy: WFL for tasks assessed/performed Overall Cognitive Status: Within Functional Limits for tasks assessed                                     General Comments       Exercises     Shoulder Instructions      Home Living Family/patient expects to be discharged to:: Private residence Living Arrangements: Spouse/significant other Available Help at Discharge: Family Type of Home: House Home Access: Stairs to enter Technical brewer of Steps: 2 Entrance Stairs-Rails: Left Home Layout: One level     Bathroom Shower/Tub: Teacher, early years/pre: Handicapped height Bathroom Accessibility:  Yes   Home Equipment: Walker - 4 wheels;Cane - single point;Grab bars - tub/shower   Additional Comments: pt reports husband and neighbor help her with household chores at home because she becomes fatigued/SOB easily.      Prior Functioning/Environment Level of Independence: Independent with assistive device(s)        Comments: Pt staes that she does not use assistive device for hosuehold ambulation but uses SPC in the community for shorter distances and rollator for long distances. Pt has history of falls but reports last fall was 2 years ago.        OT Problem List:        OT Treatment/Interventions:      OT Goals(Current goals can be found in the care plan section) Acute Rehab OT Goals OT Goal Formulation: All assessment and education complete, DC therapy  OT Frequency:     Barriers to D/C:            Co-evaluation               AM-PAC OT "6 Clicks" Daily Activity     Outcome Measure Help from another person eating meals?: None Help from another person taking care of personal grooming?: None Help from another person toileting, which includes using toliet, bedpan, or urinal?: None Help from another person bathing (including washing, rinsing, drying)?: None Help from another person to put on and taking off regular upper body clothing?: None Help from another person to put on and taking off regular lower body clothing?: None 6 Click Score: 24   End of Session Equipment Utilized During Treatment: Rolling walker Nurse Communication:  (Okay to see per Rn)  Activity Tolerance: Patient tolerated treatment well Patient left: in chair;with call bell/phone within reach  OT Visit Diagnosis: Unsteadiness on feet (R26.81)                Time: 6720-9470 OT Time Calculation (min): 16 min Charges:  OT General Charges $OT Visit: 1 Visit OT Evaluation $OT Eval Low Complexity: 1 Low  Orel Cooler, OTR/L [place]  Office [phone] Pager: [phone]   Lenward Chancellor 08/15/2020, 11:01 AM

## 2020-08-15 NOTE — Discharge Summary (Signed)
Physician Discharge Summary  Abigail Huang:[id number] DOB: 06/05/1931 DOA: 08/12/2020  PCP: Burnard Bunting, MD  Admit date: 08/12/2020 Discharge date: 08/15/2020  Time spent: 55 minutes  Recommendations for Outpatient Follow-up:  1. Follow-up with Burnard Bunting, MD in 2 weeks.  On follow-up patient will need a basic metabolic profile done to follow-up on electrolytes and renal function.  Patient CHF need to be reassessed on follow-up.  Patient's blood pressure also needs to be reassessed.  Patient's Norvasc discontinued and patient started on hydralazine which may need further up titration for better blood pressure control.  Patient will need repeat thyroid function studies done. 2. Follow-up with Dr. Moshe Cipro, nephrology in 3 weeks. 3. Patient will be discharged home with home health PT and RN.   Discharge Diagnoses:  Principal Problem:   Acute on chronic diastolic CHF (congestive heart failure) (HCC) Active Problems:   Insulin-requiring or dependent type II diabetes mellitus ([place])   Hyponatremia   CKD (chronic kidney disease), stage V ([place])   Essential hypertension   Hypothyroidism   Discharge Condition: Stable and improved  Diet recommendation: Heart healthy  Filed Weights   08/12/20 1258 08/14/20 0405 08/15/20 1017  Weight: 73.9 kg 69.6 kg 65.8 kg    History of present illness:  HPI per Dr. Renato Battles Abigail Huang is a 87 y.o. female with medical history significant for chronic kidney disease stage V, insulin-dependent diabetes mellitus, chronic diastolic CHF, and hypertension, now presenting to the emergency department for evaluation of shortness of breath.  Patient reported progressive shortness of breath over the course of weeks to months, has not had any improvement with increased Lasix as an outpatient, and reported a recent 10 pound weight gain.  She denied any chest pain, has not noticed much change in her chronic lower extremity swelling, and denies  any fevers or chills.  [facility] ED Course: Upon arrival to the ED, patient is found to be afebrile and saturating well on room air with stable BP and normal HR and RR.  EKG features a sinus rhythm and chest x-ray notable for bronchitic changes with small right pleural effusion and right base atelectasis.  Chemistry panel features a glucose of 223, sodium 129, bicarbonate 19, BUN 62, and creatinine 3.63.  CBC with hemoglobin of 10.8.  Troponin is normal x2 and BNP is elevated to 815.  COVID-19 PCR is negative.  Patient was given 80 mg IV Lasix in the ED.  Hospital Course:  1 acute on chronic diastolic CHF Patient presented with progressive shortness of breath on exertion and lower extremity edema despite increased doses of oral Lasix in the outpatient setting.  Patient noted to be volume overloaded on examination with lower extremity edema on admission as well as positive JVD.  Concern for worsening renal function.  BNP elevated at 814.9.  Patient was placed on Lasix 80 mg IV every 12 hours during the hospitalization with good urine output.  Patient was -9.490 L during this hospitalization.  Patient improved clinically was ambulating in the hallways on room air with sats of 97% with no significant shortness of breath on ambulation per patient.  2D echo was obtained with a EF of 55 to 60%, no wall motion abnormalities, grade 2 diastolic dysfunction.  Patient's weight trended down with diuresis and patient was down to 145.1 pounds from 163 pounds on admission.  Patient received IV Lasix on day of discharge will be discharged home on Lasix 80 mg in the morning and 40 mg  in the evening per nephrology recommendations.  Outpatient follow-up with PCP and nephrology.   2.  Chronic kidney disease stage IV/V Patient with advanced chronic kidney disease stage IV/V and noted to have seen nephrology in the outpatient setting approximately a year ago.  Patient presented with volume overload and concern  for worsening renal function.  Patient is not uremic, not acidotic.  Patient with good urine output on IV Lasix which she seems to be responding to.  Nephrology was consulted and followed the patient throughout the hospitalization.  Patient will be discharged home on Lasix 80 mg in the morning and 40 mg in the evening.  Outpatient follow-up with nephrology, Dr. Moshe Cipro in approximately 3 weeks.   3.  Diabetes mellitus type 2 with neuropathy Hemoglobin A1c 9.1(08/13/2020).  Patient noted to have a hypoglycemic episode and as such long-acting Lantus has been held.  Patient maintained on a sliding scale insulin.  Blood sugars improved patient was started back on Levemir 5 units daily and will be discharged back on home regimen of Levemir 15 units daily.  Outpatient follow-up with PCP.  4.  Hypertension On IV Lasix during the hospitalization.  Patient maintained on home regimen of atenolol initially at 25 mg and uptitrated to home regimen of 50 mg daily.  Patient also started on hydralazine 25 mg 3 times daily for better blood pressure control.  Outpatient follow-up with PCP for further titration..  5.  Hypothyroidism TSH at 8.343.    Patient maintained on home regimen of Synthroid.  Will need repeat thyroid function studies done on follow-up with PCP.  6.  Anemia Iron studies ordered per nephrology which showed a iron of 54, ferritin of 35, TIBC of 355, saturation ratio of 15.  Patient received a dose of IV Feraheme per nephrology recommendations on day of discharge.  Hemoglobin remained stable at 11.3.  Outpatient follow-up with nephrology.    Procedures:  2D echo 08/14/2020  Chest x-ray 08/12/2020  Consultations:  Nephrology: Dr. Moshe Cipro 08/14/2020    Discharge Exam: Vitals:   08/14/20 2101 08/15/20 0422  BP: (!) 180/79 (!) 178/65  Pulse: 69 70  Resp: 18 20  Temp: 98.2 F (36.8 C) 98.3 F (36.8 C)  SpO2: 98% 97%    General: NAD Cardiovascular: RRR Respiratory: CTAB.   Some decreased breath sounds in the bases.  Discharge Instructions   Discharge Instructions    Diet - low sodium heart healthy   Complete by: As directed    Discharge wound care:   Complete by: As directed    As above   Increase activity slowly   Complete by: As directed      Allergies as of 08/15/2020   No Known Allergies     Medication List    STOP taking these medications   amLODipine 10 MG tablet Commonly known as: NORVASC   naproxen 500 MG tablet Commonly known as: NAPROSYN   Potassium Chloride ER 20 MEQ Tbcr     TAKE these medications   atenolol 50 MG tablet Commonly known as: TENORMIN Take 50 mg by mouth every evening.   furosemide 40 MG tablet Commonly known as: LASIX Take 1-2 tablets (40-80 mg total) by mouth daily. Take 2 tablets (80 mg) in the morning, then 1 tablet (40 mg) at 5 PM daily. What changed:   medication strength  how much to take  additional instructions  Another medication with the same name was removed. Continue taking this medication, and follow the directions you see here.  hydrALAZINE 25 MG tablet Commonly known as: APRESOLINE Take 1 tablet (25 mg total) by mouth every 8 (eight) hours.   insulin detemir 100 UNIT/ML injection Commonly known as: LEVEMIR Inject 15 Units into the skin daily.   levothyroxine 50 MCG tablet Commonly known as: SYNTHROID Take 50 mcg by mouth daily.   simvastatin 20 MG tablet Commonly known as: ZOCOR Take 20 mg by mouth daily.            Discharge Care Instructions  (From admission, onward)         Start     Ordered   08/15/20 0000  Discharge wound care:       Comments: As above   08/15/20 1510         No Known Allergies  Follow-up Information    [facility] Follow up.   Specialty: Home Health Services Why: [facility] will follow you at home for Home Health. Please call the above number with any questions or concerns.  Contact information: [address] [place] [address] [phone]        Burnard Bunting, MD. Schedule an appointment as soon as possible for a visit in 2 week(s).   Specialty: Internal Medicine Contact information: [place] [address] [phone]        Corliss Parish, MD. Schedule an appointment as soon as possible for a visit in 3 week(s).   Specialty: Nephrology Contact information: [place] [address] [phone]                The results of significant diagnostics from this hospitalization (including imaging, microbiology, ancillary and laboratory) are listed below for reference.    Significant Diagnostic Studies: DG Chest Portable 1 View  Result Date: 08/12/2020 CLINICAL DATA:  Increased shortness of breath for few months, supposed to start dialysis, 10 pound weight gain, CHF, diabetes mellitus, hypertension, chronic kidney disease EXAM: PORTABLE CHEST 1 VIEW COMPARISON:  Portable exam 1302 hours compared to 07/12/2020 FINDINGS: Upper normal size of cardiac silhouette. Mediastinal contours and pulmonary vascularity normal. Atherosclerotic calcification aorta. Small RIGHT pleural effusion and RIGHT basilar atelectasis. Central peribronchial thickening. No acute infiltrate or pneumothorax. Bones demineralized. IMPRESSION: Bronchitic changes with small RIGHT pleural effusion and RIGHT basilar atelectasis. Electronically Signed   By: Lavonia Dana M.D.   On: 08/12/2020 13:19   ECHOCARDIOGRAM COMPLETE  Result Date: 08/14/2020    ECHOCARDIOGRAM REPORT   Patient Name:   Abigail Huang [facility] Date of Exam: 08/14/2020 Medical Rec #:  [id number]            Height:       66.0 in Accession #:    [id number]           Weight:       153.4 lb Date of Birth:  [birth year]            BSA:          1.787 m Patient Age:    47 years             BP:           177/68 mmHg Patient Gender: F                    HR:           70 bpm. Exam Location:  Inpatient Procedure: 2D Echo, Cardiac Doppler and Color  Doppler Indications:    CHF  History:  Patient has prior history of Echocardiogram examinations, most                 recent 03/18/2019. CHF, Signs/Symptoms:Shortness of Breath and                 CKD; Risk Factors:Diabetes, Hypertension and Dyslipidemia.  Sonographer:    Dustin Flock Referring Phys: [place]  1. Left ventricular ejection fraction, by estimation, is 55 to 60%. The left ventricle has normal function. The left ventricle has no regional wall motion abnormalities. Left ventricular diastolic parameters are consistent with Grade II diastolic dysfunction (pseudonormalization).  2. Right ventricular systolic function is normal. The right ventricular size is mildly enlarged. There is mildly elevated pulmonary artery systolic pressure. The estimated right ventricular systolic pressure is 61.9 mmHg.  3. Left atrial size was mildly dilated.  4. The mitral valve is degenerative. Mild to moderate mitral valve regurgitation. Mild mitral stenosis. The mean mitral valve gradient is 4.0 mmHg. Moderate mitral annular calcification.  5. Tricuspid valve regurgitation is moderate.  6. The aortic valve is tricuspid. Aortic valve regurgitation is not visualized. Mild aortic valve sclerosis is present, with no evidence of aortic valve stenosis.  7. The inferior vena cava is normal in size with greater than 50% respiratory variability, suggesting right atrial pressure of 3 mmHg. FINDINGS  Left Ventricle: Left ventricular ejection fraction, by estimation, is 55 to 60%. The left ventricle has normal function. The left ventricle has no regional wall motion abnormalities. The left ventricular internal cavity size was normal in size. There is  no left ventricular hypertrophy. Left ventricular diastolic parameters are consistent with Grade II diastolic dysfunction (pseudonormalization). Right Ventricle: The right ventricular size is mildly enlarged. No increase in right ventricular wall  thickness. Right ventricular systolic function is normal. There is mildly elevated pulmonary artery systolic pressure. The tricuspid regurgitant velocity is 3.03 m/s, and with an assumed right atrial pressure of 3 mmHg, the estimated right ventricular systolic pressure is 50.9 mmHg. Left Atrium: Left atrial size was mildly dilated. Right Atrium: Right atrial size was normal in size. Pericardium: There is no evidence of pericardial effusion. Mitral Valve: The mitral valve is degenerative in appearance. There is moderate calcification of the mitral valve leaflet(s). Moderate mitral annular calcification. Mild to moderate mitral valve regurgitation. Mild mitral valve stenosis. The mean mitral valve gradient is 4.0 mmHg. Tricuspid Valve: The tricuspid valve is normal in structure. Tricuspid valve regurgitation is moderate. Aortic Valve: The aortic valve is tricuspid. Aortic valve regurgitation is not visualized. Mild aortic valve sclerosis is present, with no evidence of aortic valve stenosis. Pulmonic Valve: The pulmonic valve was normal in structure. Pulmonic valve regurgitation is not visualized. Aorta: The aortic root is normal in size and structure. Venous: The inferior vena cava is normal in size with greater than 50% respiratory variability, suggesting right atrial pressure of 3 mmHg. IAS/Shunts: No atrial level shunt detected by color flow Doppler.  LEFT VENTRICLE PLAX 2D LVIDd:         4.30 cm  Diastology LVIDs:         3.20 cm  LV e' medial:    6.53 cm/s LV PW:         0.90 cm  LV E/e' medial:  17.5 LV IVS:        0.80 cm  LV e' lateral:   8.16 cm/s LVOT diam:     1.80 cm  LV E/e' lateral: 14.0 LV SV:  65 LV SV Index:   36 LVOT Area:     2.54 cm  RIGHT VENTRICLE RV Basal diam:  3.80 cm RV S prime:     3.81 cm/s TAPSE (M-mode): 2.9 cm LEFT ATRIUM             Index       RIGHT ATRIUM           Index LA diam:        4.60 cm 2.57 cm/m  RA Area:     17.30 cm LA Vol (A2C):   49.6 ml 27.76 ml/m RA Volume:    48.00 ml  26.86 ml/m LA Vol (A4C):   63.3 ml 35.43 ml/m LA Biplane Vol: 61.1 ml 34.20 ml/m  AORTIC VALVE LVOT Vmax:   104.00 cm/s LVOT Vmean:  71.000 cm/s LVOT VTI:    0.254 m  AORTA Ao Root diam: 2.70 cm MITRAL VALVE                TRICUSPID VALVE MV Area (PHT): 2.69 cm     TR Peak grad:   36.7 mmHg MV Mean grad:  4.0 mmHg     TR Vmax:        303.00 cm/s MV Decel Time: 282 msec MV E velocity: 114.00 cm/s  SHUNTS MV A velocity: 106.00 cm/s  Systemic VTI:  0.25 m MV E/A ratio:  1.08         Systemic Diam: 1.80 cm Loralie Champagne MD Electronically signed by Loralie Champagne MD Signature Date/Time: 08/14/2020/3:56:05 PM    Final     Microbiology: Recent Results (from the past 240 hour(s))  Resp Panel by RT-PCR (Flu A&B, Covid) Nasopharyngeal Swab     Status: None   Collection Time: 08/12/20  1:12 PM   Specimen: Nasopharyngeal Swab; Nasopharyngeal(NP) swabs in vial transport medium  Result Value Ref Range Status   SARS Coronavirus 2 by RT PCR NEGATIVE NEGATIVE Final    Comment: (NOTE) SARS-CoV-2 target nucleic acids are NOT DETECTED.  The SARS-CoV-2 RNA is generally detectable in upper respiratory specimens during the acute phase of infection. The lowest concentration of SARS-CoV-2 viral copies this assay can detect is 138 copies/mL. A negative result does not preclude SARS-Cov-2 infection and should not be used as the sole basis for treatment or other patient management decisions. A negative result may occur with  improper specimen collection/handling, submission of specimen other than nasopharyngeal swab, presence of viral mutation(s) within the areas targeted by this assay, and inadequate number of viral copies(<138 copies/mL). A negative result must be combined with clinical observations, patient history, and epidemiological information. The expected result is Negative.  Fact Sheet for Patients:  EntrepreneurPulse.com.au  Fact Sheet for Healthcare Providers:   IncredibleEmployment.be  This test is no t yet approved or cleared by the [place] FDA and  has been authorized for detection and/or diagnosis of SARS-CoV-2 by FDA under an Emergency Use Authorization (EUA). This EUA will remain  in effect (meaning this test can be used) for the duration of the COVID-19 declaration under Section 564(b)(1) of the Act, 21 U.S.C.section 360bbb-3(b)(1), unless the authorization is terminated  or revoked sooner.       Influenza A by PCR NEGATIVE NEGATIVE Final   Influenza B by PCR NEGATIVE NEGATIVE Final    Comment: (NOTE) The Xpert Xpress SARS-CoV-2/FLU/RSV plus assay is intended as an aid in the diagnosis of influenza from Nasopharyngeal swab specimens and should not be used as a sole basis for treatment.  Nasal washings and aspirates are unacceptable for Xpert Xpress SARS-CoV-2/FLU/RSV testing.  Fact Sheet for Patients: EntrepreneurPulse.com.au  Fact Sheet for Healthcare Providers: IncredibleEmployment.be  This test is not yet approved or cleared by the [place] FDA and has been authorized for detection and/or diagnosis of SARS-CoV-2 by FDA under an Emergency Use Authorization (EUA). This EUA will remain in effect (meaning this test can be used) for the duration of the COVID-19 declaration under Section 564(b)(1) of the Act, 21 U.S.C. section 360bbb-3(b)(1), unless the authorization is terminated or revoked.  Performed at [facility], [address] [place], [place] [address]      Labs: Basic Metabolic Panel: Recent Labs  Lab 08/12/20 1312 08/13/20 0417 08/14/20 0355 08/15/20 0420  NA 129* 137 134* 134*  K 4.2 3.9 3.7 3.8  CL 97* 103 100 99  CO2 19* 22 22 23   GLUCOSE 223* 128* 157* 156*  BUN 62* 60* 61* 55*  CREATININE 3.63* 3.56* 3.60* 3.57*  CALCIUM 9.4 9.6 9.8 9.8  MG  --  2.2 2.2 1.9  PHOS  --   --   --  5.4*   Liver Function Tests: Recent Labs   Lab 08/12/20 1312 08/15/20 0420  AST 21  --   ALT 27  --   ALKPHOS 67  --   BILITOT 0.5  --   PROT 7.2  --   ALBUMIN 3.8 3.2*   Recent Labs  Lab 08/12/20 1312  LIPASE 67*   No results for input(s): AMMONIA in the last 168 hours. CBC: Recent Labs  Lab 08/12/20 1312 08/13/20 0417 08/14/20 0355 08/15/20 0420  WBC 8.5 6.1 8.5 7.1  NEUTROABS 6.6  --   --   --   HGB 10.8* 9.8* 11.1* 11.3*  HCT 33.0* 30.3* 35.4* 34.9*  MCV 86.2 87.8 89.2 87.3  PLT 262 226 260 271   Cardiac Enzymes: No results for input(s): CKTOTAL, CKMB, CKMBINDEX, TROPONINI in the last 168 hours. BNP: BNP (last 3 results) Recent Labs    07/12/20 1230 08/12/20 1312  BNP 749.6* 814.9*    ProBNP (last 3 results) No results for input(s): PROBNP in the last 8760 hours.  CBG: Recent Labs  Lab 08/14/20 1111 08/14/20 1658 08/14/20 2107 08/15/20 0750 08/15/20 1137  GLUCAP 205* 203* 254* 160* 368*       Signed:  Irine Seal MD.  [facility] 08/15/2020, 3:11 PM

## 2020-09-04 DIAGNOSIS — N185 Chronic kidney disease, stage 5: Secondary | ICD-10-CM | POA: Diagnosis not present

## 2020-09-04 DIAGNOSIS — E871 Hypo-osmolality and hyponatremia: Secondary | ICD-10-CM | POA: Diagnosis not present

## 2020-09-04 DIAGNOSIS — I132 Hypertensive heart and chronic kidney disease with heart failure and with stage 5 chronic kidney disease, or end stage renal disease: Secondary | ICD-10-CM | POA: Diagnosis not present

## 2020-09-04 DIAGNOSIS — R112 Nausea with vomiting, unspecified: Secondary | ICD-10-CM | POA: Diagnosis not present

## 2020-09-04 DIAGNOSIS — I5032 Chronic diastolic (congestive) heart failure: Secondary | ICD-10-CM | POA: Diagnosis not present

## 2020-09-06 DIAGNOSIS — I5032 Chronic diastolic (congestive) heart failure: Secondary | ICD-10-CM | POA: Diagnosis not present

## 2020-09-06 DIAGNOSIS — E1129 Type 2 diabetes mellitus with other diabetic kidney complication: Secondary | ICD-10-CM | POA: Diagnosis not present

## 2020-09-06 DIAGNOSIS — I132 Hypertensive heart and chronic kidney disease with heart failure and with stage 5 chronic kidney disease, or end stage renal disease: Secondary | ICD-10-CM | POA: Diagnosis not present

## 2020-09-06 DIAGNOSIS — E871 Hypo-osmolality and hyponatremia: Secondary | ICD-10-CM | POA: Diagnosis not present

## 2020-09-06 DIAGNOSIS — R112 Nausea with vomiting, unspecified: Secondary | ICD-10-CM | POA: Diagnosis not present

## 2020-09-06 DIAGNOSIS — N185 Chronic kidney disease, stage 5: Secondary | ICD-10-CM | POA: Diagnosis not present

## 2020-09-11 DIAGNOSIS — I5032 Chronic diastolic (congestive) heart failure: Secondary | ICD-10-CM | POA: Diagnosis not present

## 2020-09-11 DIAGNOSIS — E871 Hypo-osmolality and hyponatremia: Secondary | ICD-10-CM | POA: Diagnosis not present

## 2020-09-11 DIAGNOSIS — E1129 Type 2 diabetes mellitus with other diabetic kidney complication: Secondary | ICD-10-CM | POA: Diagnosis not present

## 2020-09-11 DIAGNOSIS — N185 Chronic kidney disease, stage 5: Secondary | ICD-10-CM | POA: Diagnosis not present

## 2020-09-11 DIAGNOSIS — I132 Hypertensive heart and chronic kidney disease with heart failure and with stage 5 chronic kidney disease, or end stage renal disease: Secondary | ICD-10-CM | POA: Diagnosis not present

## 2020-09-12 DIAGNOSIS — I120 Hypertensive chronic kidney disease with stage 5 chronic kidney disease or end stage renal disease: Secondary | ICD-10-CM | POA: Diagnosis not present

## 2020-09-12 DIAGNOSIS — E1122 Type 2 diabetes mellitus with diabetic chronic kidney disease: Secondary | ICD-10-CM | POA: Diagnosis not present

## 2020-09-12 DIAGNOSIS — N185 Chronic kidney disease, stage 5: Secondary | ICD-10-CM | POA: Diagnosis not present

## 2020-10-15 DIAGNOSIS — E1122 Type 2 diabetes mellitus with diabetic chronic kidney disease: Secondary | ICD-10-CM | POA: Diagnosis not present

## 2020-10-15 DIAGNOSIS — N185 Chronic kidney disease, stage 5: Secondary | ICD-10-CM | POA: Diagnosis not present

## 2020-10-15 DIAGNOSIS — I120 Hypertensive chronic kidney disease with stage 5 chronic kidney disease or end stage renal disease: Secondary | ICD-10-CM | POA: Diagnosis not present

## 2020-10-24 DIAGNOSIS — H40013 Open angle with borderline findings, low risk, bilateral: Secondary | ICD-10-CM | POA: Diagnosis not present

## 2020-10-24 DIAGNOSIS — H26491 Other secondary cataract, right eye: Secondary | ICD-10-CM | POA: Diagnosis not present

## 2020-12-02 DIAGNOSIS — I132 Hypertensive heart and chronic kidney disease with heart failure and with stage 5 chronic kidney disease, or end stage renal disease: Secondary | ICD-10-CM | POA: Diagnosis not present

## 2020-12-02 DIAGNOSIS — N185 Chronic kidney disease, stage 5: Secondary | ICD-10-CM | POA: Diagnosis not present

## 2020-12-02 DIAGNOSIS — I872 Venous insufficiency (chronic) (peripheral): Secondary | ICD-10-CM | POA: Diagnosis not present

## 2020-12-02 DIAGNOSIS — E1129 Type 2 diabetes mellitus with other diabetic kidney complication: Secondary | ICD-10-CM | POA: Diagnosis not present

## 2020-12-05 DIAGNOSIS — M5441 Lumbago with sciatica, right side: Secondary | ICD-10-CM | POA: Diagnosis not present

## 2020-12-31 DIAGNOSIS — M545 Low back pain, unspecified: Secondary | ICD-10-CM | POA: Diagnosis not present

## 2020-12-31 DIAGNOSIS — M5441 Lumbago with sciatica, right side: Secondary | ICD-10-CM | POA: Diagnosis not present

## 2021-01-04 DIAGNOSIS — M545 Low back pain, unspecified: Secondary | ICD-10-CM | POA: Diagnosis not present

## 2021-01-07 DIAGNOSIS — N185 Chronic kidney disease, stage 5: Secondary | ICD-10-CM | POA: Diagnosis not present

## 2021-01-07 DIAGNOSIS — E1122 Type 2 diabetes mellitus with diabetic chronic kidney disease: Secondary | ICD-10-CM | POA: Diagnosis not present

## 2021-01-07 DIAGNOSIS — I120 Hypertensive chronic kidney disease with stage 5 chronic kidney disease or end stage renal disease: Secondary | ICD-10-CM | POA: Diagnosis not present

## 2021-01-21 DIAGNOSIS — E785 Hyperlipidemia, unspecified: Secondary | ICD-10-CM | POA: Diagnosis not present

## 2021-01-21 DIAGNOSIS — I110 Hypertensive heart disease with heart failure: Secondary | ICD-10-CM | POA: Diagnosis not present

## 2021-01-21 DIAGNOSIS — I429 Cardiomyopathy, unspecified: Secondary | ICD-10-CM | POA: Diagnosis not present

## 2021-01-21 DIAGNOSIS — I872 Venous insufficiency (chronic) (peripheral): Secondary | ICD-10-CM | POA: Diagnosis not present

## 2021-01-21 DIAGNOSIS — E1142 Type 2 diabetes mellitus with diabetic polyneuropathy: Secondary | ICD-10-CM | POA: Diagnosis not present

## 2021-01-21 DIAGNOSIS — Z794 Long term (current) use of insulin: Secondary | ICD-10-CM | POA: Diagnosis not present

## 2021-01-21 DIAGNOSIS — E1159 Type 2 diabetes mellitus with other circulatory complications: Secondary | ICD-10-CM | POA: Diagnosis not present

## 2021-01-21 DIAGNOSIS — G8929 Other chronic pain: Secondary | ICD-10-CM | POA: Diagnosis not present

## 2021-01-21 DIAGNOSIS — I509 Heart failure, unspecified: Secondary | ICD-10-CM | POA: Diagnosis not present

## 2021-01-21 DIAGNOSIS — E039 Hypothyroidism, unspecified: Secondary | ICD-10-CM | POA: Diagnosis not present

## 2021-01-24 DIAGNOSIS — M48062 Spinal stenosis, lumbar region with neurogenic claudication: Secondary | ICD-10-CM | POA: Diagnosis not present

## 2021-02-10 DIAGNOSIS — M48062 Spinal stenosis, lumbar region with neurogenic claudication: Secondary | ICD-10-CM | POA: Diagnosis not present

## 2021-03-04 DIAGNOSIS — M48062 Spinal stenosis, lumbar region with neurogenic claudication: Secondary | ICD-10-CM | POA: Diagnosis not present

## 2021-03-14 DIAGNOSIS — E785 Hyperlipidemia, unspecified: Secondary | ICD-10-CM | POA: Diagnosis not present

## 2021-03-14 DIAGNOSIS — E039 Hypothyroidism, unspecified: Secondary | ICD-10-CM | POA: Diagnosis not present

## 2021-03-14 DIAGNOSIS — N184 Chronic kidney disease, stage 4 (severe): Secondary | ICD-10-CM | POA: Diagnosis not present

## 2021-03-14 DIAGNOSIS — I10 Essential (primary) hypertension: Secondary | ICD-10-CM | POA: Diagnosis not present

## 2021-03-25 DIAGNOSIS — M48062 Spinal stenosis, lumbar region with neurogenic claudication: Secondary | ICD-10-CM | POA: Diagnosis not present

## 2021-04-03 DIAGNOSIS — E785 Hyperlipidemia, unspecified: Secondary | ICD-10-CM | POA: Diagnosis not present

## 2021-04-03 DIAGNOSIS — E039 Hypothyroidism, unspecified: Secondary | ICD-10-CM | POA: Diagnosis not present

## 2021-04-03 DIAGNOSIS — E1129 Type 2 diabetes mellitus with other diabetic kidney complication: Secondary | ICD-10-CM | POA: Diagnosis not present

## 2021-04-07 DIAGNOSIS — Z0000 Encounter for general adult medical examination without abnormal findings: Secondary | ICD-10-CM | POA: Diagnosis not present

## 2021-04-07 DIAGNOSIS — Z1339 Encounter for screening examination for other mental health and behavioral disorders: Secondary | ICD-10-CM | POA: Diagnosis not present

## 2021-04-07 DIAGNOSIS — I130 Hypertensive heart and chronic kidney disease with heart failure and stage 1 through stage 4 chronic kidney disease, or unspecified chronic kidney disease: Secondary | ICD-10-CM | POA: Diagnosis not present

## 2021-04-07 DIAGNOSIS — E1129 Type 2 diabetes mellitus with other diabetic kidney complication: Secondary | ICD-10-CM | POA: Diagnosis not present

## 2021-04-07 DIAGNOSIS — I10 Essential (primary) hypertension: Secondary | ICD-10-CM | POA: Diagnosis not present

## 2021-04-07 DIAGNOSIS — Z1331 Encounter for screening for depression: Secondary | ICD-10-CM | POA: Diagnosis not present

## 2021-04-07 DIAGNOSIS — Z23 Encounter for immunization: Secondary | ICD-10-CM | POA: Diagnosis not present

## 2021-04-07 DIAGNOSIS — N185 Chronic kidney disease, stage 5: Secondary | ICD-10-CM | POA: Diagnosis not present

## 2021-04-07 DIAGNOSIS — E871 Hypo-osmolality and hyponatremia: Secondary | ICD-10-CM | POA: Diagnosis not present

## 2021-04-07 DIAGNOSIS — I999 Unspecified disorder of circulatory system: Secondary | ICD-10-CM | POA: Diagnosis not present

## 2021-04-07 DIAGNOSIS — I872 Venous insufficiency (chronic) (peripheral): Secondary | ICD-10-CM | POA: Diagnosis not present

## 2021-04-14 DIAGNOSIS — E1129 Type 2 diabetes mellitus with other diabetic kidney complication: Secondary | ICD-10-CM | POA: Diagnosis not present

## 2021-04-14 DIAGNOSIS — I130 Hypertensive heart and chronic kidney disease with heart failure and stage 1 through stage 4 chronic kidney disease, or unspecified chronic kidney disease: Secondary | ICD-10-CM | POA: Diagnosis not present

## 2021-04-14 DIAGNOSIS — M1990 Unspecified osteoarthritis, unspecified site: Secondary | ICD-10-CM | POA: Diagnosis not present

## 2021-04-14 DIAGNOSIS — E039 Hypothyroidism, unspecified: Secondary | ICD-10-CM | POA: Diagnosis not present

## 2021-04-24 DIAGNOSIS — R5383 Other fatigue: Secondary | ICD-10-CM | POA: Diagnosis not present

## 2021-04-24 DIAGNOSIS — R0981 Nasal congestion: Secondary | ICD-10-CM | POA: Diagnosis not present

## 2021-04-24 DIAGNOSIS — U071 COVID-19: Secondary | ICD-10-CM | POA: Diagnosis not present

## 2021-04-24 DIAGNOSIS — R058 Other specified cough: Secondary | ICD-10-CM | POA: Diagnosis not present

## 2021-04-24 DIAGNOSIS — Z1152 Encounter for screening for COVID-19: Secondary | ICD-10-CM | POA: Diagnosis not present

## 2021-05-13 DIAGNOSIS — M67912 Unspecified disorder of synovium and tendon, left shoulder: Secondary | ICD-10-CM | POA: Diagnosis not present

## 2021-05-14 DIAGNOSIS — E039 Hypothyroidism, unspecified: Secondary | ICD-10-CM | POA: Diagnosis not present

## 2021-05-14 DIAGNOSIS — I130 Hypertensive heart and chronic kidney disease with heart failure and stage 1 through stage 4 chronic kidney disease, or unspecified chronic kidney disease: Secondary | ICD-10-CM | POA: Diagnosis not present

## 2021-05-14 DIAGNOSIS — I10 Essential (primary) hypertension: Secondary | ICD-10-CM | POA: Diagnosis not present

## 2021-05-14 DIAGNOSIS — E785 Hyperlipidemia, unspecified: Secondary | ICD-10-CM | POA: Diagnosis not present

## 2021-05-19 ENCOUNTER — Emergency Department (HOSPITAL_BASED_OUTPATIENT_CLINIC_OR_DEPARTMENT_OTHER): Payer: Medicare HMO

## 2021-05-19 ENCOUNTER — Encounter (HOSPITAL_BASED_OUTPATIENT_CLINIC_OR_DEPARTMENT_OTHER): Payer: Self-pay | Admitting: *Deleted

## 2021-05-19 ENCOUNTER — Emergency Department (HOSPITAL_BASED_OUTPATIENT_CLINIC_OR_DEPARTMENT_OTHER)
Admission: EM | Admit: 2021-05-19 | Discharge: 2021-05-19 | Disposition: A | Discharge status: Home / Self Care | Payer: Medicare HMO | Attending: Emergency Medicine | Admitting: Emergency Medicine

## 2021-05-19 ENCOUNTER — Other Ambulatory Visit: Payer: Self-pay

## 2021-05-19 DIAGNOSIS — Z043 Encounter for examination and observation following other accident: Secondary | ICD-10-CM | POA: Diagnosis not present

## 2021-05-19 DIAGNOSIS — N184 Chronic kidney disease, stage 4 (severe): Secondary | ICD-10-CM | POA: Insufficient documentation

## 2021-05-19 DIAGNOSIS — E039 Hypothyroidism, unspecified: Secondary | ICD-10-CM | POA: Insufficient documentation

## 2021-05-19 DIAGNOSIS — S299XXA Unspecified injury of thorax, initial encounter: Secondary | ICD-10-CM | POA: Diagnosis present

## 2021-05-19 DIAGNOSIS — I130 Hypertensive heart and chronic kidney disease with heart failure and stage 1 through stage 4 chronic kidney disease, or unspecified chronic kidney disease: Secondary | ICD-10-CM | POA: Diagnosis not present

## 2021-05-19 DIAGNOSIS — Z87891 Personal history of nicotine dependence: Secondary | ICD-10-CM | POA: Insufficient documentation

## 2021-05-19 DIAGNOSIS — I5033 Acute on chronic diastolic (congestive) heart failure: Secondary | ICD-10-CM | POA: Diagnosis not present

## 2021-05-19 DIAGNOSIS — W19XXXA Unspecified fall, initial encounter: Secondary | ICD-10-CM

## 2021-05-19 DIAGNOSIS — S4991XA Unspecified injury of right shoulder and upper arm, initial encounter: Secondary | ICD-10-CM | POA: Diagnosis not present

## 2021-05-19 DIAGNOSIS — W01198A Fall on same level from slipping, tripping and stumbling with subsequent striking against other object, initial encounter: Secondary | ICD-10-CM | POA: Insufficient documentation

## 2021-05-19 DIAGNOSIS — E1122 Type 2 diabetes mellitus with diabetic chronic kidney disease: Secondary | ICD-10-CM | POA: Insufficient documentation

## 2021-05-19 DIAGNOSIS — M19011 Primary osteoarthritis, right shoulder: Secondary | ICD-10-CM | POA: Diagnosis not present

## 2021-05-19 DIAGNOSIS — S2241XA Multiple fractures of ribs, right side, initial encounter for closed fracture: Secondary | ICD-10-CM | POA: Insufficient documentation

## 2021-05-19 DIAGNOSIS — Z794 Long term (current) use of insulin: Secondary | ICD-10-CM | POA: Diagnosis not present

## 2021-05-19 DIAGNOSIS — Z79899 Other long term (current) drug therapy: Secondary | ICD-10-CM | POA: Insufficient documentation

## 2021-05-19 DIAGNOSIS — I517 Cardiomegaly: Secondary | ICD-10-CM | POA: Diagnosis not present

## 2021-05-19 DIAGNOSIS — M25551 Pain in right hip: Secondary | ICD-10-CM | POA: Diagnosis not present

## 2021-05-19 MED ORDER — HYDROCODONE-ACETAMINOPHEN 5-325 MG PO TABS
1.0000 | ORAL_TABLET | Freq: Three times a day (TID) | ORAL | 0 refills | Status: AC | PRN
Start: 2021-05-19 — End: ?

## 2021-05-19 MED ORDER — ACETAMINOPHEN 500 MG PO TABS
1000.0000 mg | ORAL_TABLET | Freq: Once | ORAL | Status: AC
  Administered 2021-05-19: 1000 mg via ORAL
  Filled 2021-05-19: qty 2

## 2021-05-19 MED ORDER — LIDOCAINE 5 % EX PTCH
2.0000 | MEDICATED_PATCH | Freq: Once | CUTANEOUS | Status: DC
  Administered 2021-05-19: 2 via TRANSDERMAL
  Filled 2021-05-19: qty 2

## 2021-05-19 NOTE — ED Provider Notes (Signed)
[facility] EMERGENCY DEPARTMENT Provider Note   CSN: [id number] Arrival date & time: 05/19/21  1827     History Chief Complaint  Patient presents with   Abigail Huang is a 87 y.o. female.  Abigail Huang is a 87 y.o. female with hx of HTN, HLD, DM, CHF, CKD, who presents for evaluation after a mechanical fall. Pt reports she got tripped up in her bathroom causing her to fall and strike he right arm and side on the edge of the bathtub. Pt complaining of pain primarily in the right upper and and shoulder, right side and mild right hip pain. She did not hit her head or lose consciousness. Denies neck or back pain. No numbness, tingling or weakness. Pain over the right ribs is worse with movement or deep breath but no shortness of breath. No abdominal pain. Pt is not on anticoagulation. Daughter is at bedside and helps to provide history.  The history is provided by the patient, a relative and medical records.      Past Medical History:  Diagnosis Date   CHF (congestive heart failure) ([place])    Diabetes mellitus    Hyperlipidemia    Hypertension    Renal disorder     Patient Active Problem List   Diagnosis Date Noted   Acute on chronic diastolic CHF (congestive heart failure) ([place]) 08/12/2020   Acute on chronic renal failure ([place]) 03/17/2019   Hypothyroidism 03/17/2019   Acute CHF (congestive heart failure) ([place]) 03/17/2019   Essential hypertension 01/06/2017   Dyspnea on exertion 01/06/2017   Hyperlipidemia 01/06/2017   Closed displaced fracture of right femoral neck ([place]) 11/03/2016   Insulin-requiring or dependent type II diabetes mellitus ([place]) 11/03/2016   Hyponatremia 11/03/2016   Leukocytosis 11/03/2016   Normocytic anemia 11/03/2016   CKD (chronic kidney disease), stage V ([place]) 11/03/2016    Past Surgical History:  Procedure Laterality Date   ABDOMINAL HYSTERECTOMY     ANTERIOR APPROACH HEMI HIP ARTHROPLASTY Right 11/04/2016    Procedure: ANTERIOR APPROACH HEMI HIP ARTHROPLASTY;  Surgeon: Dorna Leitz, MD;  Location: [facility];  Service: Orthopedics;  Laterality: Right;   COLONOSCOPY     KNEE SURGERY     POLYPECTOMY       OB History   No obstetric history on file.     Family History  Problem Relation Age of Onset   Heart disease Mother    Heart disease Father    Heart disease Sister    Colon cancer Neg Hx    Esophageal cancer Neg Hx    Rectal cancer Neg Hx     Social History   Tobacco Use   Smoking status: Former    Types: Cigarettes    Quit date: 06/27/1967    Years since quitting: 53.9   Smokeless tobacco: Never  Vaping Use   Vaping Use: Never used  Substance Use Topics   Alcohol use: No   Drug use: No    Home Medications Prior to Admission medications   Medication Sig Start Date End Date Taking? Authorizing Provider  HYDROcodone-acetaminophen (NORCO/VICODIN) 5-325 MG tablet Take 1 tablet by mouth every 8 (eight) hours as needed. 05/19/21  Yes Jacqlyn Larsen, PA-C  atenolol (TENORMIN) 50 MG tablet Take 50 mg by mouth every evening.    [provider]  furosemide (LASIX) 40 MG tablet Take 1-2 tablets (40-80 mg total) by mouth daily. Take 2 tablets (80 mg) in the morning, then 1 tablet (  40 mg) at 5 PM daily. 08/15/20   Eugenie Filler, MD  hydrALAZINE (APRESOLINE) 25 MG tablet Take 1 tablet (25 mg total) by mouth every 8 (eight) hours. 08/15/20   Eugenie Filler, MD  insulin detemir (LEVEMIR) 100 UNIT/ML injection Inject 15 Units into the skin daily.    [provider]  levothyroxine (SYNTHROID) 50 MCG tablet Take 50 mcg by mouth daily. 06/18/20   [provider]  simvastatin (ZOCOR) 20 MG tablet Take 20 mg by mouth daily.    [provider]    Allergies    Patient has no known allergies.  Review of Systems   Review of Systems  Constitutional:  Negative for chills.  Eyes:  Negative for visual disturbance.  Respiratory:  Negative for cough and shortness of  breath.   Cardiovascular:  Negative for chest pain.  Gastrointestinal:  Negative for abdominal pain.  Genitourinary:  Negative for flank pain.  Musculoskeletal:  Positive for arthralgias and myalgias. Negative for back pain and neck pain.  Skin:  Negative for color change and rash.  Neurological:  Negative for dizziness, weakness, light-headedness, numbness and headaches.  All other systems reviewed and are negative.  Physical Exam Updated Vital Signs BP (!) 195/101 (BP Location: Left Arm)   Pulse (!) 58   Temp (!) 97.5 F (36.4 C) (Oral)   Resp 16   Ht 5\' 6"  (1.676 m)   Wt 68 kg   SpO2 100%   BMI 24.21 kg/m   Physical Exam Vitals and nursing note reviewed.  Constitutional:      General: She is not in acute distress.    Appearance: Normal appearance. She is well-developed. She is not diaphoretic.  HENT:     Head: Normocephalic and atraumatic.     Comments: No hematoma, stepoff or deformity    Nose: Nose normal.  Eyes:     General:        Right eye: No discharge.        Left eye: No discharge.     Pupils: Pupils are equal, round, and reactive to light.  Neck:     Comments: No midline c-spine tenderness, normal ROM Cardiovascular:     Rate and Rhythm: Normal rate and regular rhythm.     Pulses: Normal pulses.     Heart sounds: Normal heart sounds.  Pulmonary:     Effort: Pulmonary effort is normal. No respiratory distress.     Breath sounds: Normal breath sounds. No wheezing or rales.     Comments: Respirations equal and unlabored, patient able to speak in full sentences, lungs clear to auscultation bilaterally. No anterior chest wall tenderness, but tenderness over the right lateral ribs, no crepitus, or palpable deformity, no overlying ecchymosis Chest:     Chest wall: Tenderness present.  Abdominal:     General: Bowel sounds are normal. There is no distension.     Palpations: Abdomen is soft. There is no mass.     Tenderness: There is no abdominal tenderness. There  is no guarding.     Comments: Abdomen soft, nondistended, nontender to palpation in all quadrants without guarding or peritoneal signs, no CVA tenderness  Musculoskeletal:        General: Tenderness present.     Cervical back: Neck supple. No tenderness.     Comments: No midline thoracic or lumbar spine tenderness There is tenderness over the right shoulder and upper right arm without deformity or swelling, ROM intake with some discomfort. No tenderness at  the elbow and wrist, distal pulses 2+ There is mild tenderness over the right posterior hip without overlying bruising or skin changes, no swelling, ROM intact. All other joints supple and easily moveable  Skin:    General: Skin is warm and dry.     Capillary Refill: Capillary refill takes less than 2 seconds.  Neurological:     Mental Status: She is alert and oriented to person, place, and time.     Coordination: Coordination normal.     Comments: Speech is clear, able to follow commands CN III-XII intact Normal strength in upper and lower extremities bilaterally including dorsiflexion and plantar flexion, strong and equal grip strength Sensation normal to light and sharp touch Moves extremities without ataxia, coordination intact  Psychiatric:        Mood and Affect: Mood normal.        Behavior: Behavior normal.    ED Results / Procedures / Treatments   Labs (all labs ordered are listed, but only abnormal results are displayed) Labs Reviewed - No data to display  EKG None  Radiology DG Ribs Unilateral W/Chest Right  Result Date: 05/19/2021 CLINICAL DATA:  Fall EXAM: RIGHT RIBS AND CHEST - 3+ VIEW COMPARISON:  08/12/2020 FINDINGS: Single view chest demonstrates mild cardiomegaly. No focal opacity, pleural effusion, or pneumothorax. Right-sided rib series demonstrates acute right sixth and seventh posterolateral rib fractures. IMPRESSION: 1. Negative for pneumothorax or pleural effusion 2. Acute mildly displaced right sixth and  seventh rib fractures. Electronically Signed   By: Donavan Foil M.D.   On: 05/19/2021 21:42   DG Shoulder Right  Result Date: 05/19/2021 CLINICAL DATA:  Status post trauma. EXAM: RIGHT SHOULDER - 2+ VIEW COMPARISON:  None. FINDINGS: There is no evidence of an acute fracture or dislocation. A small, chronic appearing deformity is seen involving inferior medial aspect of the right humeral head. Mild to moderate severity degenerative changes are seen involving the right acromioclavicular joint and right glenohumeral articulation. Soft tissues are unremarkable. IMPRESSION: 1. No acute fracture or dislocation. 2. Mild to moderate severity degenerative changes. Electronically Signed   By: Virgina Norfolk M.D.   On: 05/19/2021 19:32   DG Humerus Right  Result Date: 05/19/2021 CLINICAL DATA:  Status post fall. EXAM: RIGHT HUMERUS - 2+ VIEW COMPARISON:  None. FINDINGS: There is no evidence of acute fracture or dislocation. An 8 mm chronic appearing cortical deformity is seen along the inferior medial aspect of the right humeral head. Degenerative changes are seen involving the right shoulder. Soft tissues are unremarkable. IMPRESSION: Chronic and degenerative changes without an acute osseous abnormality. Electronically Signed   By: Virgina Norfolk M.D.   On: 05/19/2021 19:31   DG Hip Unilat W or Wo Pelvis 2-3 Views Right  Result Date: 05/19/2021 CLINICAL DATA:  Fall, right hip pain EXAM: DG HIP (WITH OR WITHOUT PELVIS) 2-3V RIGHT COMPARISON:  None. FINDINGS: Normal alignment. No fracture or dislocation. Right hip bipolar hemiarthroplasty has been performed. Limited evaluation of the left hip is unremarkable. Vascular calcifications are seen within the pelvis and medial right thigh. IMPRESSION: No acute fracture or dislocation. Electronically Signed   By: Fidela Salisbury M.D.   On: 05/19/2021 21:41     Procedures Procedures   Medications Ordered in ED Medications  acetaminophen (TYLENOL) tablet 1,000  mg (1,000 mg Oral Given 05/19/21 2147)    ED Course  I have reviewed the triage vital signs and the nursing notes.  Pertinent labs & imaging results that were available during  my care of the patient were reviewed by me and considered in my medical decision making (see chart for details).    MDM Rules/Calculators/A&P                          87 yo female presents after mechanical fall. On arrival she is well appearing, stable vitals. Tenderness over the right arm and shoulder, right lateral ribs and right posterior hips. Will get plain films. Pt with no head injury or midline spinal tenderness. No ecchymosis noted.   I have independently ordered, reviewed and interpreted all imaging:  Right Shoulder and Humerus: no acute bony abnormality or dislocation  Chest & R ribs: acute mildly displaced right sixth and seventh rib fractures, no pneumothorax or other acute abnormality  R Hip & Pelvis: No fracture or acute abnormality  Discussed imaging findings with pt and daughter at bedside. Will provide incentive spirometer to help prevent atelectasis and pneumonia with rib fractures and provide pain control. Discussed cautions with hydrocodone and other options for pain relief. Will have pt follow closely with her PCP. Return precautions provided.    Final Clinical Impression(s) / ED Diagnoses Final diagnoses:  Fall, initial encounter  Closed fracture of multiple ribs of right side, initial encounter    Rx / DC Orders ED Discharge Orders          Ordered    HYDROcodone-acetaminophen (NORCO/VICODIN) 5-325 MG tablet  Every 8 hours PRN        05/19/21 2222             Jacqlyn Larsen, [place] 05/23/21 1123    Lajean Saver, MD 05/26/21 [phone]

## 2021-05-19 NOTE — ED Triage Notes (Signed)
C/o fall x 3 hrs ago with right shoulder / arm injury

## 2021-05-19 NOTE — Discharge Instructions (Addendum)
You have fractures of your right sixth and seventh ribs.  You can use Tylenol 650 mg every 6 hours and over-the-counter Salonpas lidocaine patches every 12 hours to help with pain.  For severe breakthrough pain you can use prescribed hydrocodone 1 tablet every 8 hours, use caution when using this medication as it can cause drowsiness.  You should also use the incentive spirometer several times throughout the day to help encourage you to take deep breaths.  Please follow-up closely with your primary care provider.

## 2021-05-27 DIAGNOSIS — I872 Venous insufficiency (chronic) (peripheral): Secondary | ICD-10-CM | POA: Diagnosis not present

## 2021-05-27 DIAGNOSIS — I10 Essential (primary) hypertension: Secondary | ICD-10-CM | POA: Diagnosis not present

## 2021-05-27 DIAGNOSIS — R2681 Unsteadiness on feet: Secondary | ICD-10-CM | POA: Diagnosis not present

## 2021-05-27 DIAGNOSIS — I129 Hypertensive chronic kidney disease with stage 1 through stage 4 chronic kidney disease, or unspecified chronic kidney disease: Secondary | ICD-10-CM | POA: Diagnosis not present

## 2021-05-27 DIAGNOSIS — E1122 Type 2 diabetes mellitus with diabetic chronic kidney disease: Secondary | ICD-10-CM | POA: Diagnosis not present

## 2021-05-27 DIAGNOSIS — E1140 Type 2 diabetes mellitus with diabetic neuropathy, unspecified: Secondary | ICD-10-CM | POA: Diagnosis not present

## 2021-05-27 DIAGNOSIS — Z87310 Personal history of (healed) osteoporosis fracture: Secondary | ICD-10-CM | POA: Diagnosis not present

## 2021-05-27 DIAGNOSIS — M1990 Unspecified osteoarthritis, unspecified site: Secondary | ICD-10-CM | POA: Diagnosis not present

## 2021-05-27 DIAGNOSIS — G9009 Other idiopathic peripheral autonomic neuropathy: Secondary | ICD-10-CM | POA: Diagnosis not present

## 2021-05-27 DIAGNOSIS — N1832 Chronic kidney disease, stage 3b: Secondary | ICD-10-CM | POA: Diagnosis not present

## 2021-05-27 DIAGNOSIS — M810 Age-related osteoporosis without current pathological fracture: Secondary | ICD-10-CM | POA: Diagnosis not present

## 2021-05-27 DIAGNOSIS — E1151 Type 2 diabetes mellitus with diabetic peripheral angiopathy without gangrene: Secondary | ICD-10-CM | POA: Diagnosis not present

## 2021-05-27 DIAGNOSIS — M6281 Muscle weakness (generalized): Secondary | ICD-10-CM | POA: Diagnosis not present

## 2021-05-27 DIAGNOSIS — U071 COVID-19: Secondary | ICD-10-CM | POA: Diagnosis not present

## 2021-05-27 DIAGNOSIS — R262 Difficulty in walking, not elsewhere classified: Secondary | ICD-10-CM | POA: Diagnosis not present

## 2021-05-29 DIAGNOSIS — U071 COVID-19: Secondary | ICD-10-CM | POA: Diagnosis not present

## 2021-05-29 DIAGNOSIS — R410 Disorientation, unspecified: Secondary | ICD-10-CM | POA: Diagnosis not present

## 2021-05-29 DIAGNOSIS — R262 Difficulty in walking, not elsewhere classified: Secondary | ICD-10-CM | POA: Diagnosis not present

## 2021-05-29 DIAGNOSIS — I10 Essential (primary) hypertension: Secondary | ICD-10-CM | POA: Diagnosis not present

## 2021-05-29 DIAGNOSIS — M6281 Muscle weakness (generalized): Secondary | ICD-10-CM | POA: Diagnosis not present

## 2021-05-29 DIAGNOSIS — G9009 Other idiopathic peripheral autonomic neuropathy: Secondary | ICD-10-CM | POA: Diagnosis not present

## 2021-05-29 DIAGNOSIS — E1129 Type 2 diabetes mellitus with other diabetic kidney complication: Secondary | ICD-10-CM | POA: Diagnosis not present

## 2021-05-30 ENCOUNTER — Encounter (HOSPITAL_COMMUNITY): Payer: Self-pay

## 2021-05-30 ENCOUNTER — Other Ambulatory Visit: Payer: Self-pay

## 2021-05-30 ENCOUNTER — Emergency Department (HOSPITAL_COMMUNITY): Payer: Medicare HMO

## 2021-05-30 ENCOUNTER — Emergency Department (HOSPITAL_COMMUNITY)
Admission: EM | Admit: 2021-05-30 | Discharge: 2021-05-30 | Disposition: A | Discharge status: Home / Self Care | Payer: Medicare HMO | Attending: Emergency Medicine | Admitting: Emergency Medicine

## 2021-05-30 DIAGNOSIS — E1122 Type 2 diabetes mellitus with diabetic chronic kidney disease: Secondary | ICD-10-CM | POA: Diagnosis not present

## 2021-05-30 DIAGNOSIS — I5033 Acute on chronic diastolic (congestive) heart failure: Secondary | ICD-10-CM | POA: Insufficient documentation

## 2021-05-30 DIAGNOSIS — N185 Chronic kidney disease, stage 5: Secondary | ICD-10-CM | POA: Diagnosis not present

## 2021-05-30 DIAGNOSIS — Z87891 Personal history of nicotine dependence: Secondary | ICD-10-CM | POA: Diagnosis not present

## 2021-05-30 DIAGNOSIS — R404 Transient alteration of awareness: Secondary | ICD-10-CM | POA: Diagnosis not present

## 2021-05-30 DIAGNOSIS — E11649 Type 2 diabetes mellitus with hypoglycemia without coma: Secondary | ICD-10-CM | POA: Insufficient documentation

## 2021-05-30 DIAGNOSIS — E162 Hypoglycemia, unspecified: Secondary | ICD-10-CM

## 2021-05-30 DIAGNOSIS — Z743 Need for continuous supervision: Secondary | ICD-10-CM | POA: Diagnosis not present

## 2021-05-30 DIAGNOSIS — Z79899 Other long term (current) drug therapy: Secondary | ICD-10-CM | POA: Diagnosis not present

## 2021-05-30 DIAGNOSIS — I132 Hypertensive heart and chronic kidney disease with heart failure and with stage 5 chronic kidney disease, or end stage renal disease: Secondary | ICD-10-CM | POA: Insufficient documentation

## 2021-05-30 DIAGNOSIS — R4020 Unspecified coma: Secondary | ICD-10-CM | POA: Diagnosis not present

## 2021-05-30 DIAGNOSIS — Z794 Long term (current) use of insulin: Secondary | ICD-10-CM | POA: Insufficient documentation

## 2021-05-30 DIAGNOSIS — E039 Hypothyroidism, unspecified: Secondary | ICD-10-CM | POA: Insufficient documentation

## 2021-05-30 DIAGNOSIS — I517 Cardiomegaly: Secondary | ICD-10-CM | POA: Diagnosis not present

## 2021-05-30 DIAGNOSIS — E161 Other hypoglycemia: Secondary | ICD-10-CM | POA: Diagnosis not present

## 2021-05-30 LAB — URINALYSIS, ROUTINE W REFLEX MICROSCOPIC
Bilirubin Urine: NEGATIVE
Glucose, UA: 250 mg/dL — AB
Ketones, ur: NEGATIVE mg/dL
Leukocytes,Ua: NEGATIVE
Nitrite: NEGATIVE
Protein, ur: >300 mg/dL — AB
Specific Gravity, Urine: 1.02 (ref 1.005–1.030)
pH: 7.5 (ref 5.0–8.0)

## 2021-05-30 LAB — URINALYSIS, MICROSCOPIC (REFLEX): Bacteria, UA: NONE SEEN

## 2021-05-30 LAB — CBC WITH DIFFERENTIAL/PLATELET
Abs Immature Granulocytes: 0.04 10*3/uL (ref 0.00–0.07)
Basophils Absolute: 0 10*3/uL (ref 0.0–0.1)
Basophils Relative: 0 %
Eosinophils Absolute: 0 10*3/uL (ref 0.0–0.5)
Eosinophils Relative: 0 %
HCT: 33.7 % — ABNORMAL LOW (ref 36.0–46.0)
Hemoglobin: 10.8 g/dL — ABNORMAL LOW (ref 12.0–15.0)
Immature Granulocytes: 1 %
Lymphocytes Relative: 12 %
Lymphs Abs: 1.1 10*3/uL (ref 0.7–4.0)
MCH: 29.8 pg (ref 26.0–34.0)
MCHC: 32 g/dL (ref 30.0–36.0)
MCV: 93.1 fL (ref 80.0–100.0)
Monocytes Absolute: 0.7 10*3/uL (ref 0.1–1.0)
Monocytes Relative: 8 %
Neutro Abs: 7 10*3/uL (ref 1.7–7.7)
Neutrophils Relative %: 79 %
Platelets: 318 10*3/uL (ref 150–400)
RBC: 3.62 MIL/uL — ABNORMAL LOW (ref 3.87–5.11)
RDW: 13.8 % (ref 11.5–15.5)
WBC: 8.8 10*3/uL (ref 4.0–10.5)
nRBC: 0 % (ref 0.0–0.2)

## 2021-05-30 LAB — BASIC METABOLIC PANEL
Anion gap: 11 (ref 5–15)
BUN: 48 mg/dL — ABNORMAL HIGH (ref 8–23)
CO2: 23 mmol/L (ref 22–32)
Calcium: 9.1 mg/dL (ref 8.9–10.3)
Chloride: 101 mmol/L (ref 98–111)
Creatinine, Ser: 3.09 mg/dL — ABNORMAL HIGH (ref 0.44–1.00)
GFR, Estimated: 14 mL/min — ABNORMAL LOW (ref >60)
Glucose, Bld: 113 mg/dL — ABNORMAL HIGH (ref 70–99)
Potassium: 3.6 mmol/L (ref 3.5–5.1)
Sodium: 135 mmol/L (ref 135–145)

## 2021-05-30 LAB — CBG MONITORING, ED: Glucose-Capillary: 138 mg/dL — ABNORMAL HIGH (ref 70–99)

## 2021-05-30 MED ORDER — SODIUM CHLORIDE 0.9 % IV SOLN: INTRAVENOUS | Status: DC

## 2021-05-30 NOTE — ED Notes (Signed)
Daughter at bedside and aware of 2 skin tears on pt right arm.

## 2021-05-30 NOTE — Discharge Instructions (Addendum)
Hold insulin for today.  She can have her other medications.  Check her blood sugar tonight and keep a record.  Check blood sugar again tomorrow morning.  Make sure she is eating properly.  If blood sugars over 300 would go ahead and give half of her normal insulin dose.  And continue that pattern through the weekend.  If blood sugars start to get extremely high like up around greater than 600.  Can probably go back and redosed with her normal amount of insulin.  Make sure she is eating properly.

## 2021-05-30 NOTE — ED Notes (Signed)
Dr. Verbalized pt can eat

## 2021-05-30 NOTE — ED Provider Notes (Signed)
[facility] EMERGENCY DEPARTMENT Provider Note   CSN: [id number] Arrival date & time: 05/30/21  5400     History Chief Complaint  Patient presents with   Hypoglycemia    Abigail Huang is a 87 y.o. female.  Patient brought in by EMS.  Patient became unresponsive at home.  Did not truly fall.  Patient became combative resulting in a skin tear to her right forearm.  EMS stated blood sugar was 59.  They gave 15 g of D10 blood sugar recheck was 186 and 191.  Patient did wake up and was alert.  Patient has a known right rib fracture her daughter is staying with her because of this.  Patient has longstanding type 2 diabetes.  Patient is on and injected insulin Levemir.  Patient did not have any of that this morning.  Some question whether she is eating as well as she should but the daughter thinks she is.  Normally her blood sugars run very high and she is followed by Dr. Reynaldo Minium in [place].  So this was a bit unusual.  Patient seemed to be fine yesterday and seemed to be okay this morning.  Till the event occurred.      Past Medical History:  Diagnosis Date   CHF (congestive heart failure) ([place])    Diabetes mellitus    Hyperlipidemia    Hypertension    Renal disorder     Patient Active Problem List   Diagnosis Date Noted   Acute on chronic diastolic CHF (congestive heart failure) ([place]) 08/12/2020   Acute on chronic renal failure ([place]) 03/17/2019   Hypothyroidism 03/17/2019   Acute CHF (congestive heart failure) ([place]) 03/17/2019   Essential hypertension 01/06/2017   Dyspnea on exertion 01/06/2017   Hyperlipidemia 01/06/2017   Closed displaced fracture of right femoral neck ([place]) 11/03/2016   Insulin-requiring or dependent type II diabetes mellitus ([place]) 11/03/2016   Hyponatremia 11/03/2016   Leukocytosis 11/03/2016   Normocytic anemia 11/03/2016   CKD (chronic kidney disease), stage V ([place]) 11/03/2016    Past Surgical History:  Procedure Laterality Date    ABDOMINAL HYSTERECTOMY     ANTERIOR APPROACH HEMI HIP ARTHROPLASTY Right 11/04/2016   Procedure: ANTERIOR APPROACH HEMI HIP ARTHROPLASTY;  Surgeon: Dorna Leitz, MD;  Location: [facility];  Service: Orthopedics;  Laterality: Right;   COLONOSCOPY     KNEE SURGERY     POLYPECTOMY       OB History   No obstetric history on file.     Family History  Problem Relation Age of Onset   Heart disease Mother    Heart disease Father    Heart disease Sister    Colon cancer Neg Hx    Esophageal cancer Neg Hx    Rectal cancer Neg Hx     Social History   Tobacco Use   Smoking status: Former    Types: Cigarettes    Quit date: 06/27/1967    Years since quitting: 53.9   Smokeless tobacco: Never  Vaping Use   Vaping Use: Never used  Substance Use Topics   Alcohol use: No   Drug use: No    Home Medications Prior to Admission medications   Medication Sig Start Date End Date Taking? Authorizing Provider  atenolol (TENORMIN) 50 MG tablet Take 50 mg by mouth every evening.    [provider]  furosemide (LASIX) 40 MG tablet Take 1-2 tablets (40-80 mg total) by mouth daily. Take 2 tablets (80 mg) in the morning, then 1  tablet (40 mg) at 5 PM daily. 08/15/20   Eugenie Filler, MD  hydrALAZINE (APRESOLINE) 25 MG tablet Take 1 tablet (25 mg total) by mouth every 8 (eight) hours. 08/15/20   Eugenie Filler, MD  HYDROcodone-acetaminophen (NORCO/VICODIN) 5-325 MG tablet Take 1 tablet by mouth every 8 (eight) hours as needed. 05/19/21   Jacqlyn Larsen, PA-C  insulin detemir (LEVEMIR) 100 UNIT/ML injection Inject 15 Units into the skin daily.    [provider]  levothyroxine (SYNTHROID) 50 MCG tablet Take 50 mcg by mouth daily. 06/18/20   [provider]  simvastatin (ZOCOR) 20 MG tablet Take 20 mg by mouth daily.    [provider]    Allergies    Patient has no known allergies.  Review of Systems   Review of Systems  Constitutional:  Negative for chills and fever.   HENT:  Negative for ear pain and sore throat.   Eyes:  Negative for pain and visual disturbance.  Respiratory:  Negative for cough and shortness of breath.   Cardiovascular:  Negative for chest pain and palpitations.  Gastrointestinal:  Negative for abdominal pain and vomiting.  Genitourinary:  Negative for dysuria and hematuria.  Musculoskeletal:  Negative for arthralgias and back pain.  Skin:  Positive for wound. Negative for color change and rash.  Neurological:  Negative for seizures and syncope.  All other systems reviewed and are negative.  Physical Exam Updated Vital Signs BP (!) 183/70    Pulse 73    Temp 97.8 F (36.6 C) (Oral)    Resp 17    Ht 1.676 m (5\' 6" )    Wt 62.1 kg    SpO2 99%    BMI 22.11 kg/m   Physical Exam Vitals and nursing note reviewed.  Constitutional:      General: She is not in acute distress.    Appearance: Normal appearance. She is well-developed.  HENT:     Head: Normocephalic and atraumatic.     Mouth/Throat:     Mouth: Mucous membranes are dry.  Eyes:     Extraocular Movements: Extraocular movements intact.     Conjunctiva/sclera: Conjunctivae normal.     Pupils: Pupils are equal, round, and reactive to light.  Cardiovascular:     Rate and Rhythm: Normal rate and regular rhythm.     Heart sounds: No murmur heard. Pulmonary:     Effort: Pulmonary effort is normal. No respiratory distress.     Breath sounds: Normal breath sounds.     Comments: Patient tender to palpation to the right anterior chest area. Chest:     Chest wall: Tenderness present.  Abdominal:     Palpations: Abdomen is soft.     Tenderness: There is no abdominal tenderness.  Musculoskeletal:        General: No swelling.     Cervical back: Normal range of motion and neck supple.     Comments: 2 small skin tears to the right wrist and right forearm  Skin:    General: Skin is warm and dry.     Capillary Refill: Capillary refill takes less than 2 seconds.  Neurological:      General: No focal deficit present.     Mental Status: She is alert and oriented to person, place, and time.     Cranial Nerves: No cranial nerve deficit.     Sensory: No sensory deficit.     Motor: No weakness.  Psychiatric:        Mood  and Affect: Mood normal.    ED Results / Procedures / Treatments   Labs (all labs ordered are listed, but only abnormal results are displayed) Labs Reviewed  CBC WITH DIFFERENTIAL/PLATELET - Abnormal; Notable for the following components:      Result Value   RBC 3.62 (*)    Hemoglobin 10.8 (*)    HCT 33.7 (*)    All other components within normal limits  BASIC METABOLIC PANEL - Abnormal; Notable for the following components:   Glucose, Bld 113 (*)    BUN 48 (*)    Creatinine, Ser 3.09 (*)    GFR, Estimated 14 (*)    All other components within normal limits  URINALYSIS, ROUTINE W REFLEX MICROSCOPIC - Abnormal; Notable for the following components:   Glucose, UA 250 (*)    Hgb urine dipstick SMALL (*)    Protein, ur >300 (*)    All other components within normal limits  CBG MONITORING, ED - Abnormal; Notable for the following components:   Glucose-Capillary 138 (*)    All other components within normal limits  URINALYSIS, MICROSCOPIC (REFLEX)  CBG MONITORING, ED    EKG None  Radiology DG Chest Port 1 View  Result Date: 05/30/2021 CLINICAL DATA:  Hypoglycemia EXAM: PORTABLE CHEST 1 VIEW COMPARISON:  Chest radiograph 05/19/2021 FINDINGS: The heart is mildly enlarged.  The mediastinal contours are normal. There is no focal consolidation or pulmonary edema. There is no pleural effusion or pneumothorax. Multiple mildly displaced right-sided rib fractures are seen involving the sixth through eighth and possibly ninth ribs, increased in conspicuity since 05/19/2021. IMPRESSION: 1. No radiographic evidence of acute cardiopulmonary process. 2. Mild cardiomegaly. 3. Increased conspicuity of multiple mildly displaced right-sided rib fracture since  05/19/2021. Electronically Signed   By: Valetta Mole M.D.   On: 05/30/2021 13:54    Procedures Procedures   Medications Ordered in ED Medications  0.9 %  sodium chloride infusion ( Intravenous New Bag/Given 05/30/21 1145)    ED Course  I have reviewed the triage vital signs and the nursing notes.  Pertinent labs & imaging results that were available during my care of the patient were reviewed by me and considered in my medical decision making (see chart for details).    MDM Rules/Calculators/A&P                          CRITICAL CARE Performed by: Fredia Sorrow Total critical care time: 35 minutes Critical care time was exclusive of separately billable procedures and treating other patients. Critical care was necessary to treat or prevent imminent or life-threatening deterioration. Critical care was time spent personally by me on the following activities: development of treatment plan with patient and/or surrogate as well as nursing, discussions with consultants, evaluation of patient's response to treatment, examination of patient, obtaining history from patient or surrogate, ordering and performing treatments and interventions, ordering and review of laboratory studies, ordering and review of radiographic studies, pulse oximetry and re-evaluation of patient's condition.   Patient was able to eat here.  Most recent blood sugar was 113.  Patient's mental status has been fine.  Rest of the work-up without significant abnormalities.  Mild anemia with a hemoglobin of 10.8.  Patient known to have chronic kidney disease.  Seems to be baseline her creatinines are usually in the 3 range.  And she has had GFR's of 12-14 range since March.  Urinalysis negative for any urinary tract infection.  Patient is  remained alert patient taking in food here.  We will have them hold insulin she has not had any today at all.  And will only have them give insulin if blood sugars are over 300 that ability to  test him at home.  And then I would recommend just giving a half dose.  Make an appointment follow-up with her primary care doctor for reevaluation next week.  And return for any new or worse symptoms.  Also chest x-ray here today without any acute findings.  Does show the known right rib fractures.    Final Clinical Impression(s) / ED Diagnoses Final diagnoses:  Hypoglycemia    Rx / DC Orders ED Discharge Orders     None        Fredia Sorrow, MD 05/30/21 [phone]

## 2021-05-30 NOTE — ED Triage Notes (Signed)
Pt at home became unresponsive. EMS stated BS was 59. 15G of D10 given and pt became combative. 2 skin tears to right forearm occurred, ems applied dressing. Bs recheck 186, then 191. Pt alert and oriented x 4

## 2021-06-02 DIAGNOSIS — R1111 Vomiting without nausea: Secondary | ICD-10-CM | POA: Diagnosis not present

## 2021-06-02 DIAGNOSIS — R410 Disorientation, unspecified: Secondary | ICD-10-CM | POA: Diagnosis not present

## 2021-06-02 DIAGNOSIS — N184 Chronic kidney disease, stage 4 (severe): Secondary | ICD-10-CM | POA: Diagnosis not present

## 2021-06-02 DIAGNOSIS — K219 Gastro-esophageal reflux disease without esophagitis: Secondary | ICD-10-CM | POA: Diagnosis not present

## 2021-06-02 DIAGNOSIS — E1129 Type 2 diabetes mellitus with other diabetic kidney complication: Secondary | ICD-10-CM | POA: Diagnosis not present

## 2021-06-02 DIAGNOSIS — E162 Hypoglycemia, unspecified: Secondary | ICD-10-CM | POA: Diagnosis not present

## 2021-06-03 DIAGNOSIS — M1990 Unspecified osteoarthritis, unspecified site: Secondary | ICD-10-CM | POA: Diagnosis not present

## 2021-06-03 DIAGNOSIS — I872 Venous insufficiency (chronic) (peripheral): Secondary | ICD-10-CM | POA: Diagnosis not present

## 2021-06-03 DIAGNOSIS — E1140 Type 2 diabetes mellitus with diabetic neuropathy, unspecified: Secondary | ICD-10-CM | POA: Diagnosis not present

## 2021-06-03 DIAGNOSIS — R2681 Unsteadiness on feet: Secondary | ICD-10-CM | POA: Diagnosis not present

## 2021-06-03 DIAGNOSIS — E1151 Type 2 diabetes mellitus with diabetic peripheral angiopathy without gangrene: Secondary | ICD-10-CM | POA: Diagnosis not present

## 2021-06-03 DIAGNOSIS — Z87310 Personal history of (healed) osteoporosis fracture: Secondary | ICD-10-CM | POA: Diagnosis not present

## 2021-06-03 DIAGNOSIS — M810 Age-related osteoporosis without current pathological fracture: Secondary | ICD-10-CM | POA: Diagnosis not present

## 2021-06-03 DIAGNOSIS — I129 Hypertensive chronic kidney disease with stage 1 through stage 4 chronic kidney disease, or unspecified chronic kidney disease: Secondary | ICD-10-CM | POA: Diagnosis not present

## 2021-06-03 DIAGNOSIS — N1832 Chronic kidney disease, stage 3b: Secondary | ICD-10-CM | POA: Diagnosis not present

## 2021-06-03 DIAGNOSIS — E1122 Type 2 diabetes mellitus with diabetic chronic kidney disease: Secondary | ICD-10-CM | POA: Diagnosis not present

## 2021-06-05 DIAGNOSIS — M1990 Unspecified osteoarthritis, unspecified site: Secondary | ICD-10-CM | POA: Diagnosis not present

## 2021-06-05 DIAGNOSIS — E1140 Type 2 diabetes mellitus with diabetic neuropathy, unspecified: Secondary | ICD-10-CM | POA: Diagnosis not present

## 2021-06-05 DIAGNOSIS — R2681 Unsteadiness on feet: Secondary | ICD-10-CM | POA: Diagnosis not present

## 2021-06-05 DIAGNOSIS — N1832 Chronic kidney disease, stage 3b: Secondary | ICD-10-CM | POA: Diagnosis not present

## 2021-06-05 DIAGNOSIS — I872 Venous insufficiency (chronic) (peripheral): Secondary | ICD-10-CM | POA: Diagnosis not present

## 2021-06-05 DIAGNOSIS — I129 Hypertensive chronic kidney disease with stage 1 through stage 4 chronic kidney disease, or unspecified chronic kidney disease: Secondary | ICD-10-CM | POA: Diagnosis not present

## 2021-06-05 DIAGNOSIS — Z87310 Personal history of (healed) osteoporosis fracture: Secondary | ICD-10-CM | POA: Diagnosis not present

## 2021-06-05 DIAGNOSIS — M810 Age-related osteoporosis without current pathological fracture: Secondary | ICD-10-CM | POA: Diagnosis not present

## 2021-06-05 DIAGNOSIS — E1151 Type 2 diabetes mellitus with diabetic peripheral angiopathy without gangrene: Secondary | ICD-10-CM | POA: Diagnosis not present

## 2021-06-05 DIAGNOSIS — E1122 Type 2 diabetes mellitus with diabetic chronic kidney disease: Secondary | ICD-10-CM | POA: Diagnosis not present

## 2021-06-10 DIAGNOSIS — Z87310 Personal history of (healed) osteoporosis fracture: Secondary | ICD-10-CM | POA: Diagnosis not present

## 2021-06-10 DIAGNOSIS — E1151 Type 2 diabetes mellitus with diabetic peripheral angiopathy without gangrene: Secondary | ICD-10-CM | POA: Diagnosis not present

## 2021-06-10 DIAGNOSIS — R2681 Unsteadiness on feet: Secondary | ICD-10-CM | POA: Diagnosis not present

## 2021-06-10 DIAGNOSIS — E1122 Type 2 diabetes mellitus with diabetic chronic kidney disease: Secondary | ICD-10-CM | POA: Diagnosis not present

## 2021-06-10 DIAGNOSIS — I129 Hypertensive chronic kidney disease with stage 1 through stage 4 chronic kidney disease, or unspecified chronic kidney disease: Secondary | ICD-10-CM | POA: Diagnosis not present

## 2021-06-10 DIAGNOSIS — E1140 Type 2 diabetes mellitus with diabetic neuropathy, unspecified: Secondary | ICD-10-CM | POA: Diagnosis not present

## 2021-06-10 DIAGNOSIS — I872 Venous insufficiency (chronic) (peripheral): Secondary | ICD-10-CM | POA: Diagnosis not present

## 2021-06-10 DIAGNOSIS — M1990 Unspecified osteoarthritis, unspecified site: Secondary | ICD-10-CM | POA: Diagnosis not present

## 2021-06-10 DIAGNOSIS — M810 Age-related osteoporosis without current pathological fracture: Secondary | ICD-10-CM | POA: Diagnosis not present

## 2021-06-10 DIAGNOSIS — N1832 Chronic kidney disease, stage 3b: Secondary | ICD-10-CM | POA: Diagnosis not present

## 2021-06-11 DIAGNOSIS — N1832 Chronic kidney disease, stage 3b: Secondary | ICD-10-CM | POA: Diagnosis not present

## 2021-06-11 DIAGNOSIS — R2681 Unsteadiness on feet: Secondary | ICD-10-CM | POA: Diagnosis not present

## 2021-06-11 DIAGNOSIS — E1151 Type 2 diabetes mellitus with diabetic peripheral angiopathy without gangrene: Secondary | ICD-10-CM | POA: Diagnosis not present

## 2021-06-11 DIAGNOSIS — I129 Hypertensive chronic kidney disease with stage 1 through stage 4 chronic kidney disease, or unspecified chronic kidney disease: Secondary | ICD-10-CM | POA: Diagnosis not present

## 2021-06-11 DIAGNOSIS — E1122 Type 2 diabetes mellitus with diabetic chronic kidney disease: Secondary | ICD-10-CM | POA: Diagnosis not present

## 2021-06-11 DIAGNOSIS — I872 Venous insufficiency (chronic) (peripheral): Secondary | ICD-10-CM | POA: Diagnosis not present

## 2021-06-11 DIAGNOSIS — M810 Age-related osteoporosis without current pathological fracture: Secondary | ICD-10-CM | POA: Diagnosis not present

## 2021-06-11 DIAGNOSIS — Z87310 Personal history of (healed) osteoporosis fracture: Secondary | ICD-10-CM | POA: Diagnosis not present

## 2021-06-11 DIAGNOSIS — E1140 Type 2 diabetes mellitus with diabetic neuropathy, unspecified: Secondary | ICD-10-CM | POA: Diagnosis not present

## 2021-06-11 DIAGNOSIS — M1990 Unspecified osteoarthritis, unspecified site: Secondary | ICD-10-CM | POA: Diagnosis not present

## 2021-06-12 DIAGNOSIS — I120 Hypertensive chronic kidney disease with stage 5 chronic kidney disease or end stage renal disease: Secondary | ICD-10-CM | POA: Diagnosis not present

## 2021-06-12 DIAGNOSIS — N185 Chronic kidney disease, stage 5: Secondary | ICD-10-CM | POA: Diagnosis not present

## 2021-06-12 DIAGNOSIS — E1122 Type 2 diabetes mellitus with diabetic chronic kidney disease: Secondary | ICD-10-CM | POA: Diagnosis not present

## 2021-06-13 DIAGNOSIS — R2681 Unsteadiness on feet: Secondary | ICD-10-CM | POA: Diagnosis not present

## 2021-06-13 DIAGNOSIS — E785 Hyperlipidemia, unspecified: Secondary | ICD-10-CM | POA: Diagnosis not present

## 2021-06-13 DIAGNOSIS — M810 Age-related osteoporosis without current pathological fracture: Secondary | ICD-10-CM | POA: Diagnosis not present

## 2021-06-13 DIAGNOSIS — N1832 Chronic kidney disease, stage 3b: Secondary | ICD-10-CM | POA: Diagnosis not present

## 2021-06-13 DIAGNOSIS — Z87310 Personal history of (healed) osteoporosis fracture: Secondary | ICD-10-CM | POA: Diagnosis not present

## 2021-06-13 DIAGNOSIS — I872 Venous insufficiency (chronic) (peripheral): Secondary | ICD-10-CM | POA: Diagnosis not present

## 2021-06-13 DIAGNOSIS — I10 Essential (primary) hypertension: Secondary | ICD-10-CM | POA: Diagnosis not present

## 2021-06-13 DIAGNOSIS — M1990 Unspecified osteoarthritis, unspecified site: Secondary | ICD-10-CM | POA: Diagnosis not present

## 2021-06-13 DIAGNOSIS — E1151 Type 2 diabetes mellitus with diabetic peripheral angiopathy without gangrene: Secondary | ICD-10-CM | POA: Diagnosis not present

## 2021-06-13 DIAGNOSIS — I129 Hypertensive chronic kidney disease with stage 1 through stage 4 chronic kidney disease, or unspecified chronic kidney disease: Secondary | ICD-10-CM | POA: Diagnosis not present

## 2021-06-13 DIAGNOSIS — E039 Hypothyroidism, unspecified: Secondary | ICD-10-CM | POA: Diagnosis not present

## 2021-06-13 DIAGNOSIS — E1122 Type 2 diabetes mellitus with diabetic chronic kidney disease: Secondary | ICD-10-CM | POA: Diagnosis not present

## 2021-06-13 DIAGNOSIS — E1140 Type 2 diabetes mellitus with diabetic neuropathy, unspecified: Secondary | ICD-10-CM | POA: Diagnosis not present

## 2021-06-13 DIAGNOSIS — I130 Hypertensive heart and chronic kidney disease with heart failure and stage 1 through stage 4 chronic kidney disease, or unspecified chronic kidney disease: Secondary | ICD-10-CM | POA: Diagnosis not present

## 2021-06-20 DIAGNOSIS — R2681 Unsteadiness on feet: Secondary | ICD-10-CM | POA: Diagnosis not present

## 2021-06-20 DIAGNOSIS — E1140 Type 2 diabetes mellitus with diabetic neuropathy, unspecified: Secondary | ICD-10-CM | POA: Diagnosis not present

## 2021-06-20 DIAGNOSIS — M810 Age-related osteoporosis without current pathological fracture: Secondary | ICD-10-CM | POA: Diagnosis not present

## 2021-06-20 DIAGNOSIS — I872 Venous insufficiency (chronic) (peripheral): Secondary | ICD-10-CM | POA: Diagnosis not present

## 2021-06-20 DIAGNOSIS — N1832 Chronic kidney disease, stage 3b: Secondary | ICD-10-CM | POA: Diagnosis not present

## 2021-06-20 DIAGNOSIS — E1151 Type 2 diabetes mellitus with diabetic peripheral angiopathy without gangrene: Secondary | ICD-10-CM | POA: Diagnosis not present

## 2021-06-20 DIAGNOSIS — I129 Hypertensive chronic kidney disease with stage 1 through stage 4 chronic kidney disease, or unspecified chronic kidney disease: Secondary | ICD-10-CM | POA: Diagnosis not present

## 2021-06-20 DIAGNOSIS — M1990 Unspecified osteoarthritis, unspecified site: Secondary | ICD-10-CM | POA: Diagnosis not present

## 2021-06-20 DIAGNOSIS — E1122 Type 2 diabetes mellitus with diabetic chronic kidney disease: Secondary | ICD-10-CM | POA: Diagnosis not present

## 2021-06-20 DIAGNOSIS — Z87310 Personal history of (healed) osteoporosis fracture: Secondary | ICD-10-CM | POA: Diagnosis not present

## 2021-06-23 DIAGNOSIS — E1129 Type 2 diabetes mellitus with other diabetic kidney complication: Secondary | ICD-10-CM | POA: Diagnosis not present

## 2021-06-23 DIAGNOSIS — I5032 Chronic diastolic (congestive) heart failure: Secondary | ICD-10-CM | POA: Diagnosis not present

## 2021-06-23 DIAGNOSIS — K219 Gastro-esophageal reflux disease without esophagitis: Secondary | ICD-10-CM | POA: Diagnosis not present

## 2021-06-23 DIAGNOSIS — J90 Pleural effusion, not elsewhere classified: Secondary | ICD-10-CM | POA: Diagnosis not present

## 2021-06-23 DIAGNOSIS — I509 Heart failure, unspecified: Secondary | ICD-10-CM | POA: Diagnosis not present

## 2021-06-23 DIAGNOSIS — N184 Chronic kidney disease, stage 4 (severe): Secondary | ICD-10-CM | POA: Diagnosis not present

## 2021-06-23 DIAGNOSIS — E162 Hypoglycemia, unspecified: Secondary | ICD-10-CM | POA: Diagnosis not present

## 2021-06-23 DIAGNOSIS — R1111 Vomiting without nausea: Secondary | ICD-10-CM | POA: Diagnosis not present

## 2021-06-23 DIAGNOSIS — R0602 Shortness of breath: Secondary | ICD-10-CM | POA: Diagnosis not present

## 2021-06-23 DIAGNOSIS — I132 Hypertensive heart and chronic kidney disease with heart failure and with stage 5 chronic kidney disease, or end stage renal disease: Secondary | ICD-10-CM | POA: Diagnosis not present

## 2021-06-27 DIAGNOSIS — I132 Hypertensive heart and chronic kidney disease with heart failure and with stage 5 chronic kidney disease, or end stage renal disease: Secondary | ICD-10-CM | POA: Diagnosis not present

## 2021-06-27 DIAGNOSIS — K219 Gastro-esophageal reflux disease without esophagitis: Secondary | ICD-10-CM | POA: Diagnosis not present

## 2021-06-27 DIAGNOSIS — I509 Heart failure, unspecified: Secondary | ICD-10-CM | POA: Diagnosis not present

## 2021-06-27 DIAGNOSIS — I5032 Chronic diastolic (congestive) heart failure: Secondary | ICD-10-CM | POA: Diagnosis not present

## 2021-06-27 DIAGNOSIS — R0602 Shortness of breath: Secondary | ICD-10-CM | POA: Diagnosis not present

## 2021-06-27 DIAGNOSIS — E1129 Type 2 diabetes mellitus with other diabetic kidney complication: Secondary | ICD-10-CM | POA: Diagnosis not present

## 2021-06-27 DIAGNOSIS — J90 Pleural effusion, not elsewhere classified: Secondary | ICD-10-CM | POA: Diagnosis not present

## 2021-06-27 DIAGNOSIS — N184 Chronic kidney disease, stage 4 (severe): Secondary | ICD-10-CM | POA: Diagnosis not present

## 2021-06-27 DIAGNOSIS — Z7189 Other specified counseling: Secondary | ICD-10-CM | POA: Diagnosis not present

## 2021-06-27 DIAGNOSIS — L03116 Cellulitis of left lower limb: Secondary | ICD-10-CM | POA: Diagnosis not present

## 2021-07-05 DIAGNOSIS — K219 Gastro-esophageal reflux disease without esophagitis: Secondary | ICD-10-CM | POA: Diagnosis not present

## 2021-07-05 DIAGNOSIS — E1151 Type 2 diabetes mellitus with diabetic peripheral angiopathy without gangrene: Secondary | ICD-10-CM | POA: Diagnosis not present

## 2021-07-05 DIAGNOSIS — N184 Chronic kidney disease, stage 4 (severe): Secondary | ICD-10-CM | POA: Diagnosis not present

## 2021-07-05 DIAGNOSIS — I872 Venous insufficiency (chronic) (peripheral): Secondary | ICD-10-CM | POA: Diagnosis not present

## 2021-07-05 DIAGNOSIS — E785 Hyperlipidemia, unspecified: Secondary | ICD-10-CM | POA: Diagnosis not present

## 2021-07-05 DIAGNOSIS — E1122 Type 2 diabetes mellitus with diabetic chronic kidney disease: Secondary | ICD-10-CM | POA: Diagnosis not present

## 2021-07-05 DIAGNOSIS — E11649 Type 2 diabetes mellitus with hypoglycemia without coma: Secondary | ICD-10-CM | POA: Diagnosis not present

## 2021-07-05 DIAGNOSIS — I5033 Acute on chronic diastolic (congestive) heart failure: Secondary | ICD-10-CM | POA: Diagnosis not present

## 2021-07-05 DIAGNOSIS — E871 Hypo-osmolality and hyponatremia: Secondary | ICD-10-CM | POA: Diagnosis not present

## 2021-07-05 DIAGNOSIS — I130 Hypertensive heart and chronic kidney disease with heart failure and stage 1 through stage 4 chronic kidney disease, or unspecified chronic kidney disease: Secondary | ICD-10-CM | POA: Diagnosis not present

## 2021-07-05 DIAGNOSIS — L03116 Cellulitis of left lower limb: Secondary | ICD-10-CM | POA: Diagnosis not present

## 2021-07-08 DIAGNOSIS — E1151 Type 2 diabetes mellitus with diabetic peripheral angiopathy without gangrene: Secondary | ICD-10-CM | POA: Diagnosis not present

## 2021-07-08 DIAGNOSIS — K219 Gastro-esophageal reflux disease without esophagitis: Secondary | ICD-10-CM | POA: Diagnosis not present

## 2021-07-08 DIAGNOSIS — I130 Hypertensive heart and chronic kidney disease with heart failure and stage 1 through stage 4 chronic kidney disease, or unspecified chronic kidney disease: Secondary | ICD-10-CM | POA: Diagnosis not present

## 2021-07-08 DIAGNOSIS — I5033 Acute on chronic diastolic (congestive) heart failure: Secondary | ICD-10-CM | POA: Diagnosis not present

## 2021-07-08 DIAGNOSIS — N184 Chronic kidney disease, stage 4 (severe): Secondary | ICD-10-CM | POA: Diagnosis not present

## 2021-07-08 DIAGNOSIS — E785 Hyperlipidemia, unspecified: Secondary | ICD-10-CM | POA: Diagnosis not present

## 2021-07-08 DIAGNOSIS — I872 Venous insufficiency (chronic) (peripheral): Secondary | ICD-10-CM | POA: Diagnosis not present

## 2021-07-08 DIAGNOSIS — L03116 Cellulitis of left lower limb: Secondary | ICD-10-CM | POA: Diagnosis not present

## 2021-07-08 DIAGNOSIS — E1122 Type 2 diabetes mellitus with diabetic chronic kidney disease: Secondary | ICD-10-CM | POA: Diagnosis not present

## 2021-07-08 DIAGNOSIS — E11649 Type 2 diabetes mellitus with hypoglycemia without coma: Secondary | ICD-10-CM | POA: Diagnosis not present

## 2021-07-09 DIAGNOSIS — L03116 Cellulitis of left lower limb: Secondary | ICD-10-CM | POA: Diagnosis not present

## 2021-07-09 DIAGNOSIS — I130 Hypertensive heart and chronic kidney disease with heart failure and stage 1 through stage 4 chronic kidney disease, or unspecified chronic kidney disease: Secondary | ICD-10-CM | POA: Diagnosis not present

## 2021-07-09 DIAGNOSIS — E11649 Type 2 diabetes mellitus with hypoglycemia without coma: Secondary | ICD-10-CM | POA: Diagnosis not present

## 2021-07-09 DIAGNOSIS — E1122 Type 2 diabetes mellitus with diabetic chronic kidney disease: Secondary | ICD-10-CM | POA: Diagnosis not present

## 2021-07-09 DIAGNOSIS — E1151 Type 2 diabetes mellitus with diabetic peripheral angiopathy without gangrene: Secondary | ICD-10-CM | POA: Diagnosis not present

## 2021-07-09 DIAGNOSIS — I872 Venous insufficiency (chronic) (peripheral): Secondary | ICD-10-CM | POA: Diagnosis not present

## 2021-07-09 DIAGNOSIS — N184 Chronic kidney disease, stage 4 (severe): Secondary | ICD-10-CM | POA: Diagnosis not present

## 2021-07-09 DIAGNOSIS — I5033 Acute on chronic diastolic (congestive) heart failure: Secondary | ICD-10-CM | POA: Diagnosis not present

## 2021-07-09 DIAGNOSIS — K219 Gastro-esophageal reflux disease without esophagitis: Secondary | ICD-10-CM | POA: Diagnosis not present

## 2021-07-09 DIAGNOSIS — E785 Hyperlipidemia, unspecified: Secondary | ICD-10-CM | POA: Diagnosis not present

## 2021-07-11 DIAGNOSIS — E1151 Type 2 diabetes mellitus with diabetic peripheral angiopathy without gangrene: Secondary | ICD-10-CM | POA: Diagnosis not present

## 2021-07-11 DIAGNOSIS — N184 Chronic kidney disease, stage 4 (severe): Secondary | ICD-10-CM | POA: Diagnosis not present

## 2021-07-11 DIAGNOSIS — E1122 Type 2 diabetes mellitus with diabetic chronic kidney disease: Secondary | ICD-10-CM | POA: Diagnosis not present

## 2021-07-11 DIAGNOSIS — I5033 Acute on chronic diastolic (congestive) heart failure: Secondary | ICD-10-CM | POA: Diagnosis not present

## 2021-07-11 DIAGNOSIS — L03116 Cellulitis of left lower limb: Secondary | ICD-10-CM | POA: Diagnosis not present

## 2021-07-11 DIAGNOSIS — E11649 Type 2 diabetes mellitus with hypoglycemia without coma: Secondary | ICD-10-CM | POA: Diagnosis not present

## 2021-07-11 DIAGNOSIS — K219 Gastro-esophageal reflux disease without esophagitis: Secondary | ICD-10-CM | POA: Diagnosis not present

## 2021-07-11 DIAGNOSIS — E785 Hyperlipidemia, unspecified: Secondary | ICD-10-CM | POA: Diagnosis not present

## 2021-07-11 DIAGNOSIS — I872 Venous insufficiency (chronic) (peripheral): Secondary | ICD-10-CM | POA: Diagnosis not present

## 2021-07-11 DIAGNOSIS — I130 Hypertensive heart and chronic kidney disease with heart failure and stage 1 through stage 4 chronic kidney disease, or unspecified chronic kidney disease: Secondary | ICD-10-CM | POA: Diagnosis not present

## 2021-07-15 DIAGNOSIS — I5033 Acute on chronic diastolic (congestive) heart failure: Secondary | ICD-10-CM | POA: Diagnosis not present

## 2021-07-15 DIAGNOSIS — E785 Hyperlipidemia, unspecified: Secondary | ICD-10-CM | POA: Diagnosis not present

## 2021-07-15 DIAGNOSIS — N184 Chronic kidney disease, stage 4 (severe): Secondary | ICD-10-CM | POA: Diagnosis not present

## 2021-07-15 DIAGNOSIS — L03116 Cellulitis of left lower limb: Secondary | ICD-10-CM | POA: Diagnosis not present

## 2021-07-15 DIAGNOSIS — E11649 Type 2 diabetes mellitus with hypoglycemia without coma: Secondary | ICD-10-CM | POA: Diagnosis not present

## 2021-07-15 DIAGNOSIS — I130 Hypertensive heart and chronic kidney disease with heart failure and stage 1 through stage 4 chronic kidney disease, or unspecified chronic kidney disease: Secondary | ICD-10-CM | POA: Diagnosis not present

## 2021-07-15 DIAGNOSIS — K219 Gastro-esophageal reflux disease without esophagitis: Secondary | ICD-10-CM | POA: Diagnosis not present

## 2021-07-15 DIAGNOSIS — I872 Venous insufficiency (chronic) (peripheral): Secondary | ICD-10-CM | POA: Diagnosis not present

## 2021-07-15 DIAGNOSIS — E1151 Type 2 diabetes mellitus with diabetic peripheral angiopathy without gangrene: Secondary | ICD-10-CM | POA: Diagnosis not present

## 2021-07-15 DIAGNOSIS — E1122 Type 2 diabetes mellitus with diabetic chronic kidney disease: Secondary | ICD-10-CM | POA: Diagnosis not present

## 2021-07-16 DIAGNOSIS — E1122 Type 2 diabetes mellitus with diabetic chronic kidney disease: Secondary | ICD-10-CM | POA: Diagnosis not present

## 2021-07-16 DIAGNOSIS — L03116 Cellulitis of left lower limb: Secondary | ICD-10-CM | POA: Diagnosis not present

## 2021-07-16 DIAGNOSIS — I130 Hypertensive heart and chronic kidney disease with heart failure and stage 1 through stage 4 chronic kidney disease, or unspecified chronic kidney disease: Secondary | ICD-10-CM | POA: Diagnosis not present

## 2021-07-16 DIAGNOSIS — K219 Gastro-esophageal reflux disease without esophagitis: Secondary | ICD-10-CM | POA: Diagnosis not present

## 2021-07-16 DIAGNOSIS — I872 Venous insufficiency (chronic) (peripheral): Secondary | ICD-10-CM | POA: Diagnosis not present

## 2021-07-16 DIAGNOSIS — E785 Hyperlipidemia, unspecified: Secondary | ICD-10-CM | POA: Diagnosis not present

## 2021-07-16 DIAGNOSIS — E11649 Type 2 diabetes mellitus with hypoglycemia without coma: Secondary | ICD-10-CM | POA: Diagnosis not present

## 2021-07-16 DIAGNOSIS — I5033 Acute on chronic diastolic (congestive) heart failure: Secondary | ICD-10-CM | POA: Diagnosis not present

## 2021-07-16 DIAGNOSIS — N184 Chronic kidney disease, stage 4 (severe): Secondary | ICD-10-CM | POA: Diagnosis not present

## 2021-07-16 DIAGNOSIS — E1151 Type 2 diabetes mellitus with diabetic peripheral angiopathy without gangrene: Secondary | ICD-10-CM | POA: Diagnosis not present

## 2021-07-16 DEATH — deceased

## 2021-07-18 DIAGNOSIS — E11649 Type 2 diabetes mellitus with hypoglycemia without coma: Secondary | ICD-10-CM | POA: Diagnosis not present

## 2021-07-18 DIAGNOSIS — E1122 Type 2 diabetes mellitus with diabetic chronic kidney disease: Secondary | ICD-10-CM | POA: Diagnosis not present

## 2021-07-18 DIAGNOSIS — E1151 Type 2 diabetes mellitus with diabetic peripheral angiopathy without gangrene: Secondary | ICD-10-CM | POA: Diagnosis not present

## 2021-07-18 DIAGNOSIS — E785 Hyperlipidemia, unspecified: Secondary | ICD-10-CM | POA: Diagnosis not present

## 2021-07-18 DIAGNOSIS — N184 Chronic kidney disease, stage 4 (severe): Secondary | ICD-10-CM | POA: Diagnosis not present

## 2021-07-18 DIAGNOSIS — I130 Hypertensive heart and chronic kidney disease with heart failure and stage 1 through stage 4 chronic kidney disease, or unspecified chronic kidney disease: Secondary | ICD-10-CM | POA: Diagnosis not present

## 2021-07-18 DIAGNOSIS — L03116 Cellulitis of left lower limb: Secondary | ICD-10-CM | POA: Diagnosis not present

## 2021-07-18 DIAGNOSIS — K219 Gastro-esophageal reflux disease without esophagitis: Secondary | ICD-10-CM | POA: Diagnosis not present

## 2021-07-18 DIAGNOSIS — I5033 Acute on chronic diastolic (congestive) heart failure: Secondary | ICD-10-CM | POA: Diagnosis not present

## 2021-07-18 DIAGNOSIS — I872 Venous insufficiency (chronic) (peripheral): Secondary | ICD-10-CM | POA: Diagnosis not present

## 2021-07-22 DIAGNOSIS — I872 Venous insufficiency (chronic) (peripheral): Secondary | ICD-10-CM | POA: Diagnosis not present

## 2021-07-22 DIAGNOSIS — I5033 Acute on chronic diastolic (congestive) heart failure: Secondary | ICD-10-CM | POA: Diagnosis not present

## 2021-07-22 DIAGNOSIS — L03116 Cellulitis of left lower limb: Secondary | ICD-10-CM | POA: Diagnosis not present

## 2021-07-22 DIAGNOSIS — E785 Hyperlipidemia, unspecified: Secondary | ICD-10-CM | POA: Diagnosis not present

## 2021-07-22 DIAGNOSIS — I130 Hypertensive heart and chronic kidney disease with heart failure and stage 1 through stage 4 chronic kidney disease, or unspecified chronic kidney disease: Secondary | ICD-10-CM | POA: Diagnosis not present

## 2021-07-22 DIAGNOSIS — K219 Gastro-esophageal reflux disease without esophagitis: Secondary | ICD-10-CM | POA: Diagnosis not present

## 2021-07-22 DIAGNOSIS — E1122 Type 2 diabetes mellitus with diabetic chronic kidney disease: Secondary | ICD-10-CM | POA: Diagnosis not present

## 2021-07-22 DIAGNOSIS — N184 Chronic kidney disease, stage 4 (severe): Secondary | ICD-10-CM | POA: Diagnosis not present

## 2021-07-22 DIAGNOSIS — E11649 Type 2 diabetes mellitus with hypoglycemia without coma: Secondary | ICD-10-CM | POA: Diagnosis not present

## 2021-07-22 DIAGNOSIS — E1151 Type 2 diabetes mellitus with diabetic peripheral angiopathy without gangrene: Secondary | ICD-10-CM | POA: Diagnosis not present

## 2021-07-23 DIAGNOSIS — K219 Gastro-esophageal reflux disease without esophagitis: Secondary | ICD-10-CM | POA: Diagnosis not present

## 2021-07-23 DIAGNOSIS — I872 Venous insufficiency (chronic) (peripheral): Secondary | ICD-10-CM | POA: Diagnosis not present

## 2021-07-23 DIAGNOSIS — E1151 Type 2 diabetes mellitus with diabetic peripheral angiopathy without gangrene: Secondary | ICD-10-CM | POA: Diagnosis not present

## 2021-07-23 DIAGNOSIS — I130 Hypertensive heart and chronic kidney disease with heart failure and stage 1 through stage 4 chronic kidney disease, or unspecified chronic kidney disease: Secondary | ICD-10-CM | POA: Diagnosis not present

## 2021-07-23 DIAGNOSIS — I5033 Acute on chronic diastolic (congestive) heart failure: Secondary | ICD-10-CM | POA: Diagnosis not present

## 2021-07-23 DIAGNOSIS — L03116 Cellulitis of left lower limb: Secondary | ICD-10-CM | POA: Diagnosis not present

## 2021-07-23 DIAGNOSIS — N184 Chronic kidney disease, stage 4 (severe): Secondary | ICD-10-CM | POA: Diagnosis not present

## 2021-07-23 DIAGNOSIS — E785 Hyperlipidemia, unspecified: Secondary | ICD-10-CM | POA: Diagnosis not present

## 2021-07-23 DIAGNOSIS — E11649 Type 2 diabetes mellitus with hypoglycemia without coma: Secondary | ICD-10-CM | POA: Diagnosis not present

## 2021-07-23 DIAGNOSIS — E1122 Type 2 diabetes mellitus with diabetic chronic kidney disease: Secondary | ICD-10-CM | POA: Diagnosis not present

## 2021-07-24 DIAGNOSIS — E1151 Type 2 diabetes mellitus with diabetic peripheral angiopathy without gangrene: Secondary | ICD-10-CM | POA: Diagnosis not present

## 2021-07-24 DIAGNOSIS — E11649 Type 2 diabetes mellitus with hypoglycemia without coma: Secondary | ICD-10-CM | POA: Diagnosis not present

## 2021-07-24 DIAGNOSIS — I130 Hypertensive heart and chronic kidney disease with heart failure and stage 1 through stage 4 chronic kidney disease, or unspecified chronic kidney disease: Secondary | ICD-10-CM | POA: Diagnosis not present

## 2021-07-24 DIAGNOSIS — E785 Hyperlipidemia, unspecified: Secondary | ICD-10-CM | POA: Diagnosis not present

## 2021-07-24 DIAGNOSIS — E1122 Type 2 diabetes mellitus with diabetic chronic kidney disease: Secondary | ICD-10-CM | POA: Diagnosis not present

## 2021-07-24 DIAGNOSIS — L03116 Cellulitis of left lower limb: Secondary | ICD-10-CM | POA: Diagnosis not present

## 2021-07-24 DIAGNOSIS — I5033 Acute on chronic diastolic (congestive) heart failure: Secondary | ICD-10-CM | POA: Diagnosis not present

## 2021-07-24 DIAGNOSIS — I872 Venous insufficiency (chronic) (peripheral): Secondary | ICD-10-CM | POA: Diagnosis not present

## 2021-07-24 DIAGNOSIS — N184 Chronic kidney disease, stage 4 (severe): Secondary | ICD-10-CM | POA: Diagnosis not present

## 2021-07-24 DIAGNOSIS — K219 Gastro-esophageal reflux disease without esophagitis: Secondary | ICD-10-CM | POA: Diagnosis not present

## 2021-07-25 DIAGNOSIS — I872 Venous insufficiency (chronic) (peripheral): Secondary | ICD-10-CM | POA: Diagnosis not present

## 2021-07-25 DIAGNOSIS — E1122 Type 2 diabetes mellitus with diabetic chronic kidney disease: Secondary | ICD-10-CM | POA: Diagnosis not present

## 2021-07-25 DIAGNOSIS — E785 Hyperlipidemia, unspecified: Secondary | ICD-10-CM | POA: Diagnosis not present

## 2021-07-25 DIAGNOSIS — I5033 Acute on chronic diastolic (congestive) heart failure: Secondary | ICD-10-CM | POA: Diagnosis not present

## 2021-07-25 DIAGNOSIS — E1151 Type 2 diabetes mellitus with diabetic peripheral angiopathy without gangrene: Secondary | ICD-10-CM | POA: Diagnosis not present

## 2021-07-25 DIAGNOSIS — K219 Gastro-esophageal reflux disease without esophagitis: Secondary | ICD-10-CM | POA: Diagnosis not present

## 2021-07-25 DIAGNOSIS — L03116 Cellulitis of left lower limb: Secondary | ICD-10-CM | POA: Diagnosis not present

## 2021-07-25 DIAGNOSIS — I130 Hypertensive heart and chronic kidney disease with heart failure and stage 1 through stage 4 chronic kidney disease, or unspecified chronic kidney disease: Secondary | ICD-10-CM | POA: Diagnosis not present

## 2021-07-25 DIAGNOSIS — E11649 Type 2 diabetes mellitus with hypoglycemia without coma: Secondary | ICD-10-CM | POA: Diagnosis not present

## 2021-07-25 DIAGNOSIS — N184 Chronic kidney disease, stage 4 (severe): Secondary | ICD-10-CM | POA: Diagnosis not present

## 2021-07-29 DIAGNOSIS — E785 Hyperlipidemia, unspecified: Secondary | ICD-10-CM | POA: Diagnosis not present

## 2021-07-29 DIAGNOSIS — L03116 Cellulitis of left lower limb: Secondary | ICD-10-CM | POA: Diagnosis not present

## 2021-07-29 DIAGNOSIS — I5033 Acute on chronic diastolic (congestive) heart failure: Secondary | ICD-10-CM | POA: Diagnosis not present

## 2021-07-29 DIAGNOSIS — E1151 Type 2 diabetes mellitus with diabetic peripheral angiopathy without gangrene: Secondary | ICD-10-CM | POA: Diagnosis not present

## 2021-07-29 DIAGNOSIS — E1122 Type 2 diabetes mellitus with diabetic chronic kidney disease: Secondary | ICD-10-CM | POA: Diagnosis not present

## 2021-07-29 DIAGNOSIS — K219 Gastro-esophageal reflux disease without esophagitis: Secondary | ICD-10-CM | POA: Diagnosis not present

## 2021-07-29 DIAGNOSIS — I872 Venous insufficiency (chronic) (peripheral): Secondary | ICD-10-CM | POA: Diagnosis not present

## 2021-07-29 DIAGNOSIS — E11649 Type 2 diabetes mellitus with hypoglycemia without coma: Secondary | ICD-10-CM | POA: Diagnosis not present

## 2021-07-29 DIAGNOSIS — I130 Hypertensive heart and chronic kidney disease with heart failure and stage 1 through stage 4 chronic kidney disease, or unspecified chronic kidney disease: Secondary | ICD-10-CM | POA: Diagnosis not present

## 2021-07-29 DIAGNOSIS — N184 Chronic kidney disease, stage 4 (severe): Secondary | ICD-10-CM | POA: Diagnosis not present

## 2021-07-30 DIAGNOSIS — E11649 Type 2 diabetes mellitus with hypoglycemia without coma: Secondary | ICD-10-CM | POA: Diagnosis not present

## 2021-07-30 DIAGNOSIS — N184 Chronic kidney disease, stage 4 (severe): Secondary | ICD-10-CM | POA: Diagnosis not present

## 2021-07-30 DIAGNOSIS — E1122 Type 2 diabetes mellitus with diabetic chronic kidney disease: Secondary | ICD-10-CM | POA: Diagnosis not present

## 2021-07-30 DIAGNOSIS — I872 Venous insufficiency (chronic) (peripheral): Secondary | ICD-10-CM | POA: Diagnosis not present

## 2021-07-30 DIAGNOSIS — K219 Gastro-esophageal reflux disease without esophagitis: Secondary | ICD-10-CM | POA: Diagnosis not present

## 2021-07-30 DIAGNOSIS — E1151 Type 2 diabetes mellitus with diabetic peripheral angiopathy without gangrene: Secondary | ICD-10-CM | POA: Diagnosis not present

## 2021-07-30 DIAGNOSIS — I130 Hypertensive heart and chronic kidney disease with heart failure and stage 1 through stage 4 chronic kidney disease, or unspecified chronic kidney disease: Secondary | ICD-10-CM | POA: Diagnosis not present

## 2021-07-30 DIAGNOSIS — I5033 Acute on chronic diastolic (congestive) heart failure: Secondary | ICD-10-CM | POA: Diagnosis not present

## 2021-07-30 DIAGNOSIS — E785 Hyperlipidemia, unspecified: Secondary | ICD-10-CM | POA: Diagnosis not present

## 2021-07-30 DIAGNOSIS — L03116 Cellulitis of left lower limb: Secondary | ICD-10-CM | POA: Diagnosis not present

## 2021-07-31 DIAGNOSIS — I5033 Acute on chronic diastolic (congestive) heart failure: Secondary | ICD-10-CM | POA: Diagnosis not present

## 2021-07-31 DIAGNOSIS — E785 Hyperlipidemia, unspecified: Secondary | ICD-10-CM | POA: Diagnosis not present

## 2021-07-31 DIAGNOSIS — E1122 Type 2 diabetes mellitus with diabetic chronic kidney disease: Secondary | ICD-10-CM | POA: Diagnosis not present

## 2021-07-31 DIAGNOSIS — L03116 Cellulitis of left lower limb: Secondary | ICD-10-CM | POA: Diagnosis not present

## 2021-07-31 DIAGNOSIS — N184 Chronic kidney disease, stage 4 (severe): Secondary | ICD-10-CM | POA: Diagnosis not present

## 2021-07-31 DIAGNOSIS — K219 Gastro-esophageal reflux disease without esophagitis: Secondary | ICD-10-CM | POA: Diagnosis not present

## 2021-07-31 DIAGNOSIS — E11649 Type 2 diabetes mellitus with hypoglycemia without coma: Secondary | ICD-10-CM | POA: Diagnosis not present

## 2021-07-31 DIAGNOSIS — I872 Venous insufficiency (chronic) (peripheral): Secondary | ICD-10-CM | POA: Diagnosis not present

## 2021-07-31 DIAGNOSIS — I130 Hypertensive heart and chronic kidney disease with heart failure and stage 1 through stage 4 chronic kidney disease, or unspecified chronic kidney disease: Secondary | ICD-10-CM | POA: Diagnosis not present

## 2021-07-31 DIAGNOSIS — E1151 Type 2 diabetes mellitus with diabetic peripheral angiopathy without gangrene: Secondary | ICD-10-CM | POA: Diagnosis not present

## 2021-08-13 DEATH — deceased

## 2021-08-16 IMAGING — CT CT HEAD W/O CM
3 series · 15 of 47 positions shown, 18 images · non-contrast
Comparison: September 25, 2017

CLINICAL DATA: Confusion, increasing for several days

EXAM:
CT HEAD WITHOUT CONTRAST
TECHNIQUE: Contiguous axial images were obtained from the base of the skull
through the vertex without intravenous contrast.

[Series 2: head wo · axial · 0.49mm/px · z∈[-156,-31]mm · 9 of 31 slices shown, 12 images]
[im 3/31  brain]
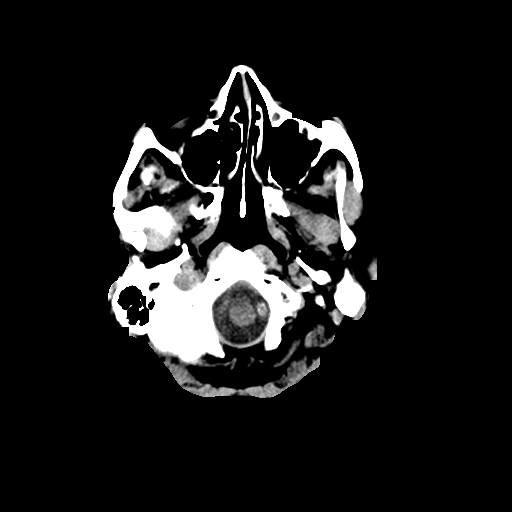
[im 3/31  bone]
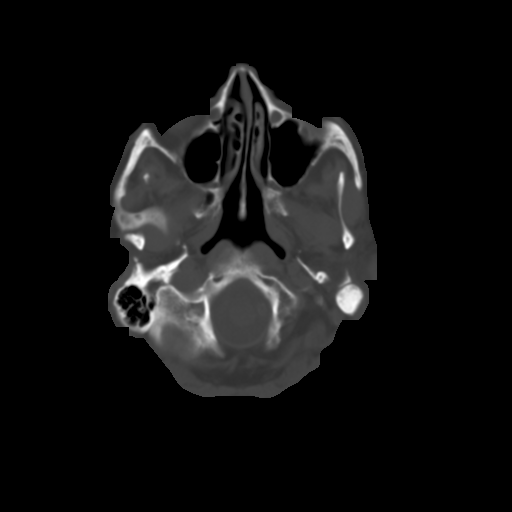
[im 6/31  brain]
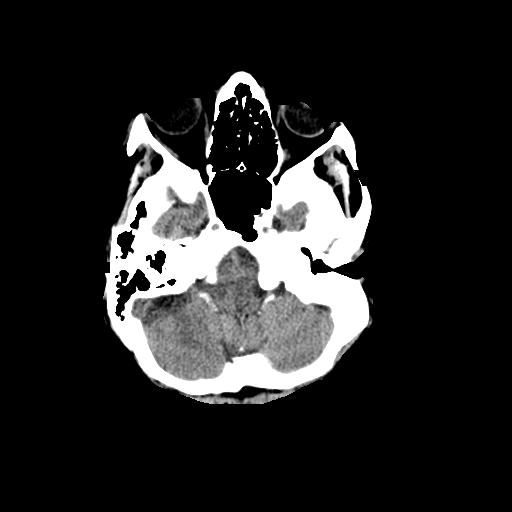
[im 9/31  brain]
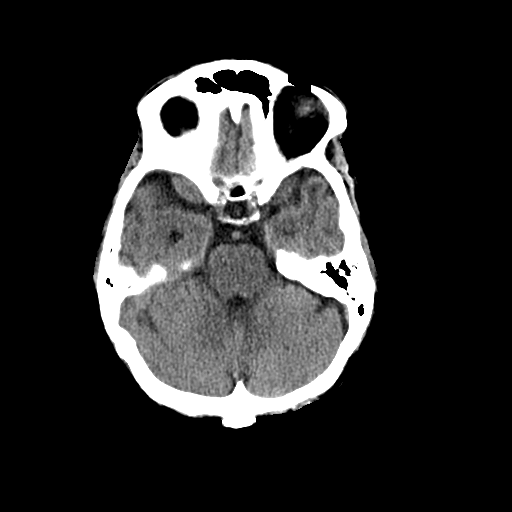
[im 12/31  brain]
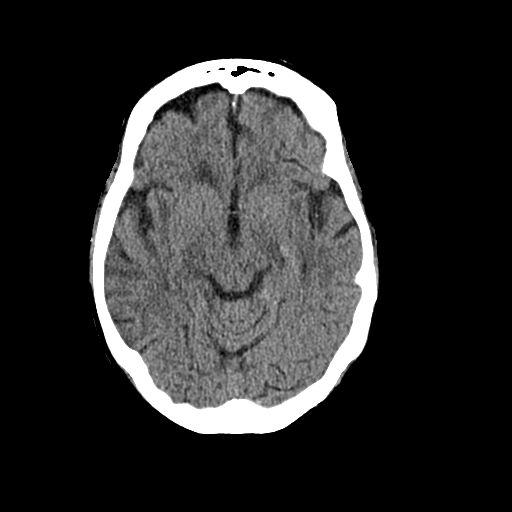
[im 16/31  brain]
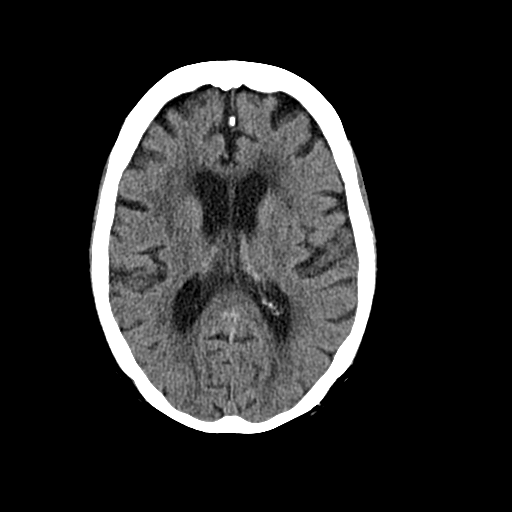
[im 16/31  bone]
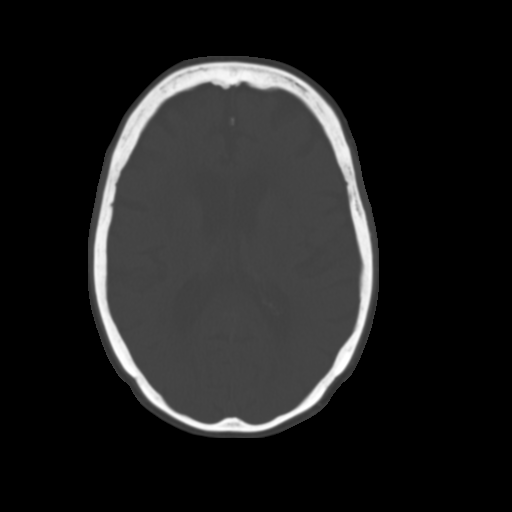
[im 19/31  brain]
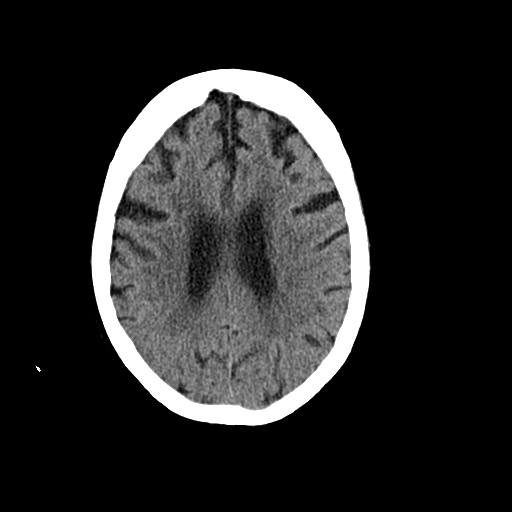
[im 22/31  brain]
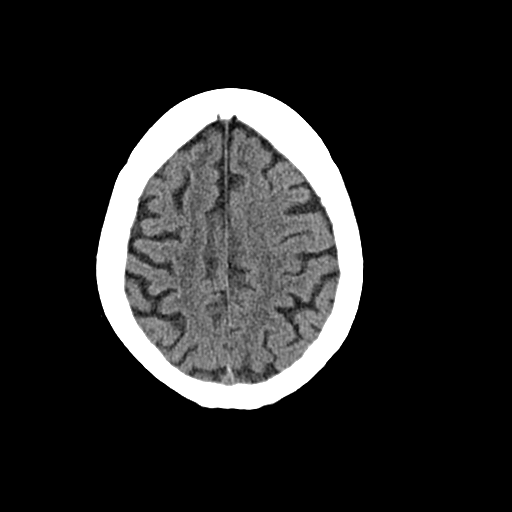
[im 25/31  brain]
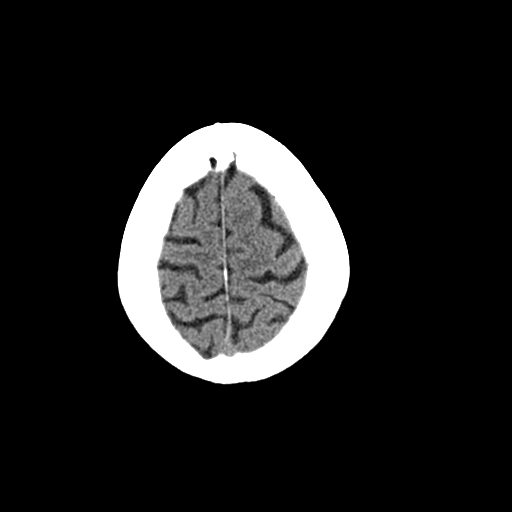
[im 28/31  brain]
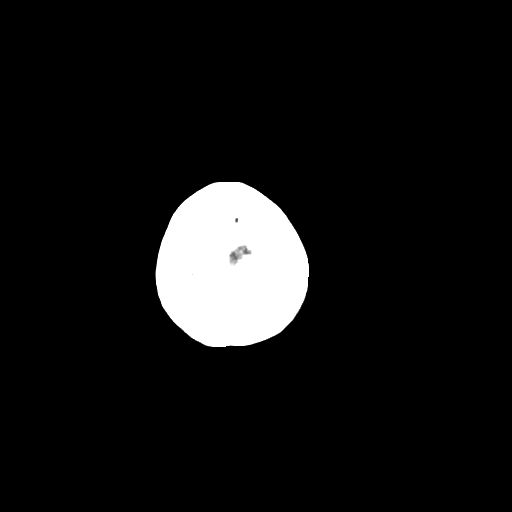
[im 28/31  bone]
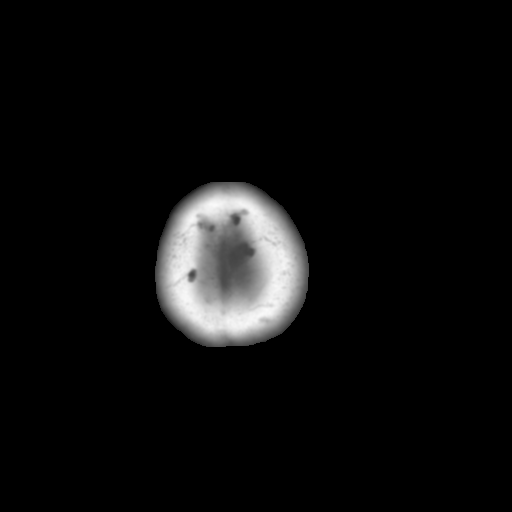

[Series 4: cor soft · coronal · 0.40mm/px · 3 of 82 slices shown]
[im 28/82  brain]
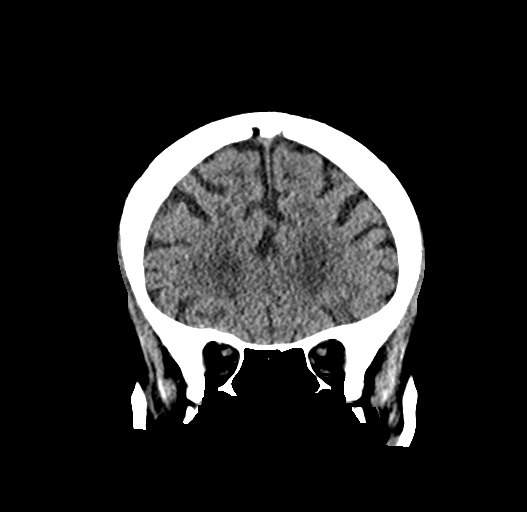
[im 37/82  brain]
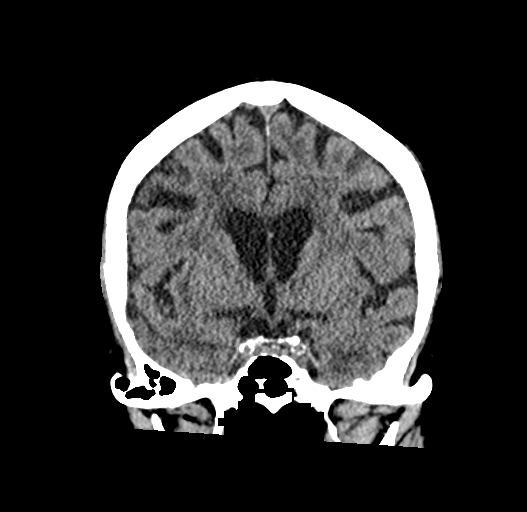
[im 46/82  brain]
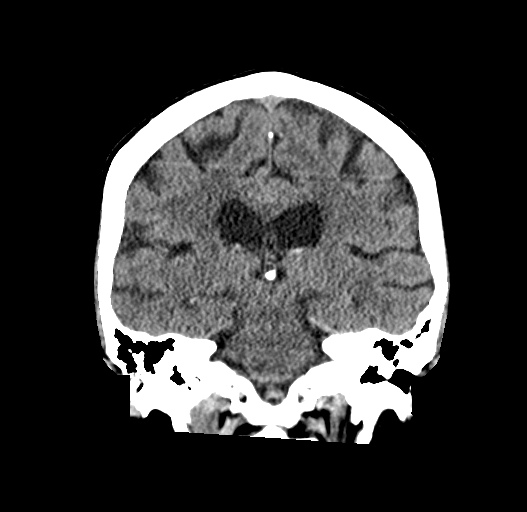

[Series 5: sag soft · sagittal · 0.40mm/px · 3 of 71 slices shown]
[im 24/71  brain]
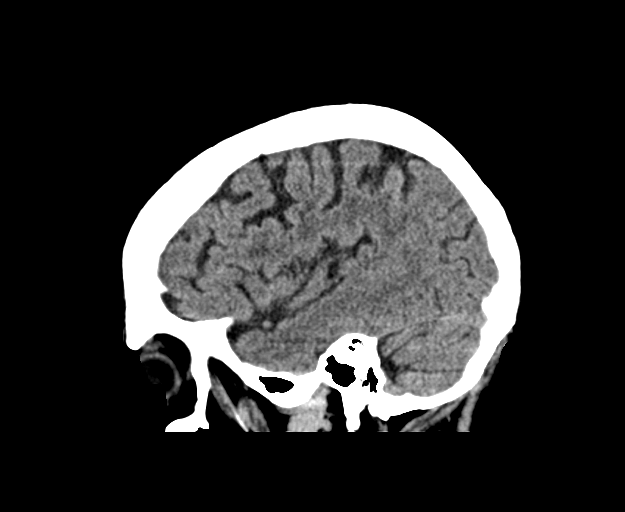
[im 36/71  brain]
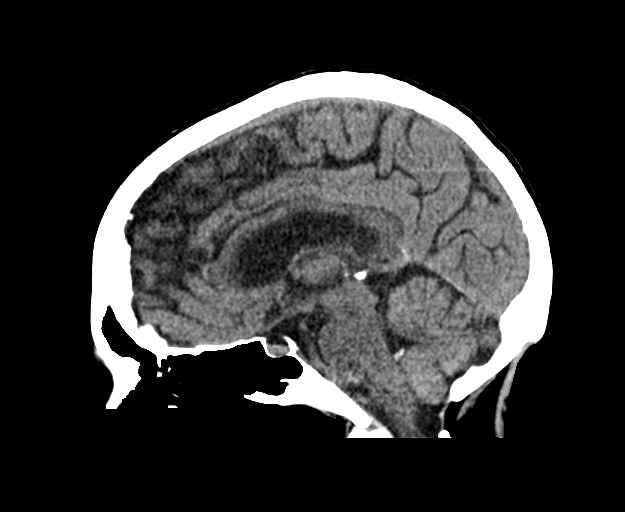
[im 47/71  brain]
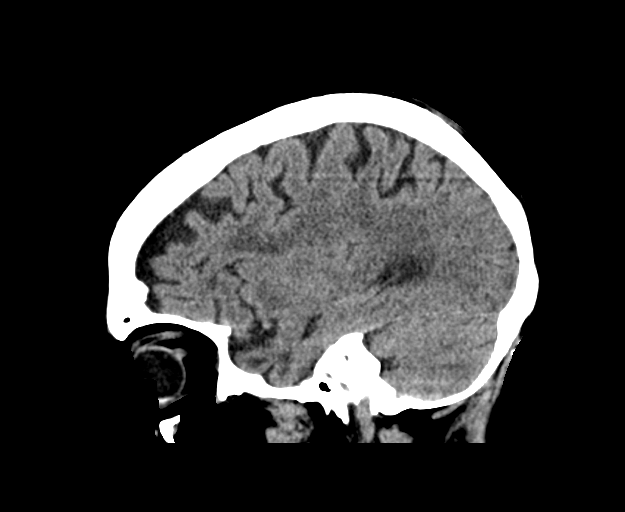

[15 of 47 positions shown; findings below may reference images not displayed]

FINDINGS: Brain: Mild diffuse atrophy is stable. There is no intracranial
mass, hemorrhage, extra-axial fluid collection, or midline shift.
There is patchy small vessel disease in the centra semiovale
bilaterally, stable. No acute infarct is demonstrable on this study.

Vascular: There is no hyperdense vessel. There is calcification in
each carotid siphon region.

Skull: The bony calvarium appears intact.

Sinuses/Orbits: There is mucosal thickening in several ethmoid air
cells. Other visualized paranasal sinuses are clear. Orbits appear
symmetric bilaterally.

Other: There is extensive opacification in the left mastoid air cell
region, stable. Mastoids on the right are clear. There is debris in
the external auditory canals, more on the right than on the left.
IMPRESSION: Stable atrophy with periventricular small vessel disease. No acute
infarct evident. No mass or hemorrhage.

There are foci of arterial vascular calcification. There is
extensive chronic left-sided mastoid air cell disease. There is
mucosal thickening in several ethmoid air cells. There is probable
cerumen in the external auditory canals, more on the right than the
left.

## 2021-08-16 IMAGING — CR DG CHEST 2V
2 series · 2 of 2 positions shown · non-contrast
Comparison: Head chest radiograph dated 11/03/2016

CLINICAL DATA: 87-year-old female with altered mental status

EXAM:
CHEST - 2 VIEW

[w chest pa]
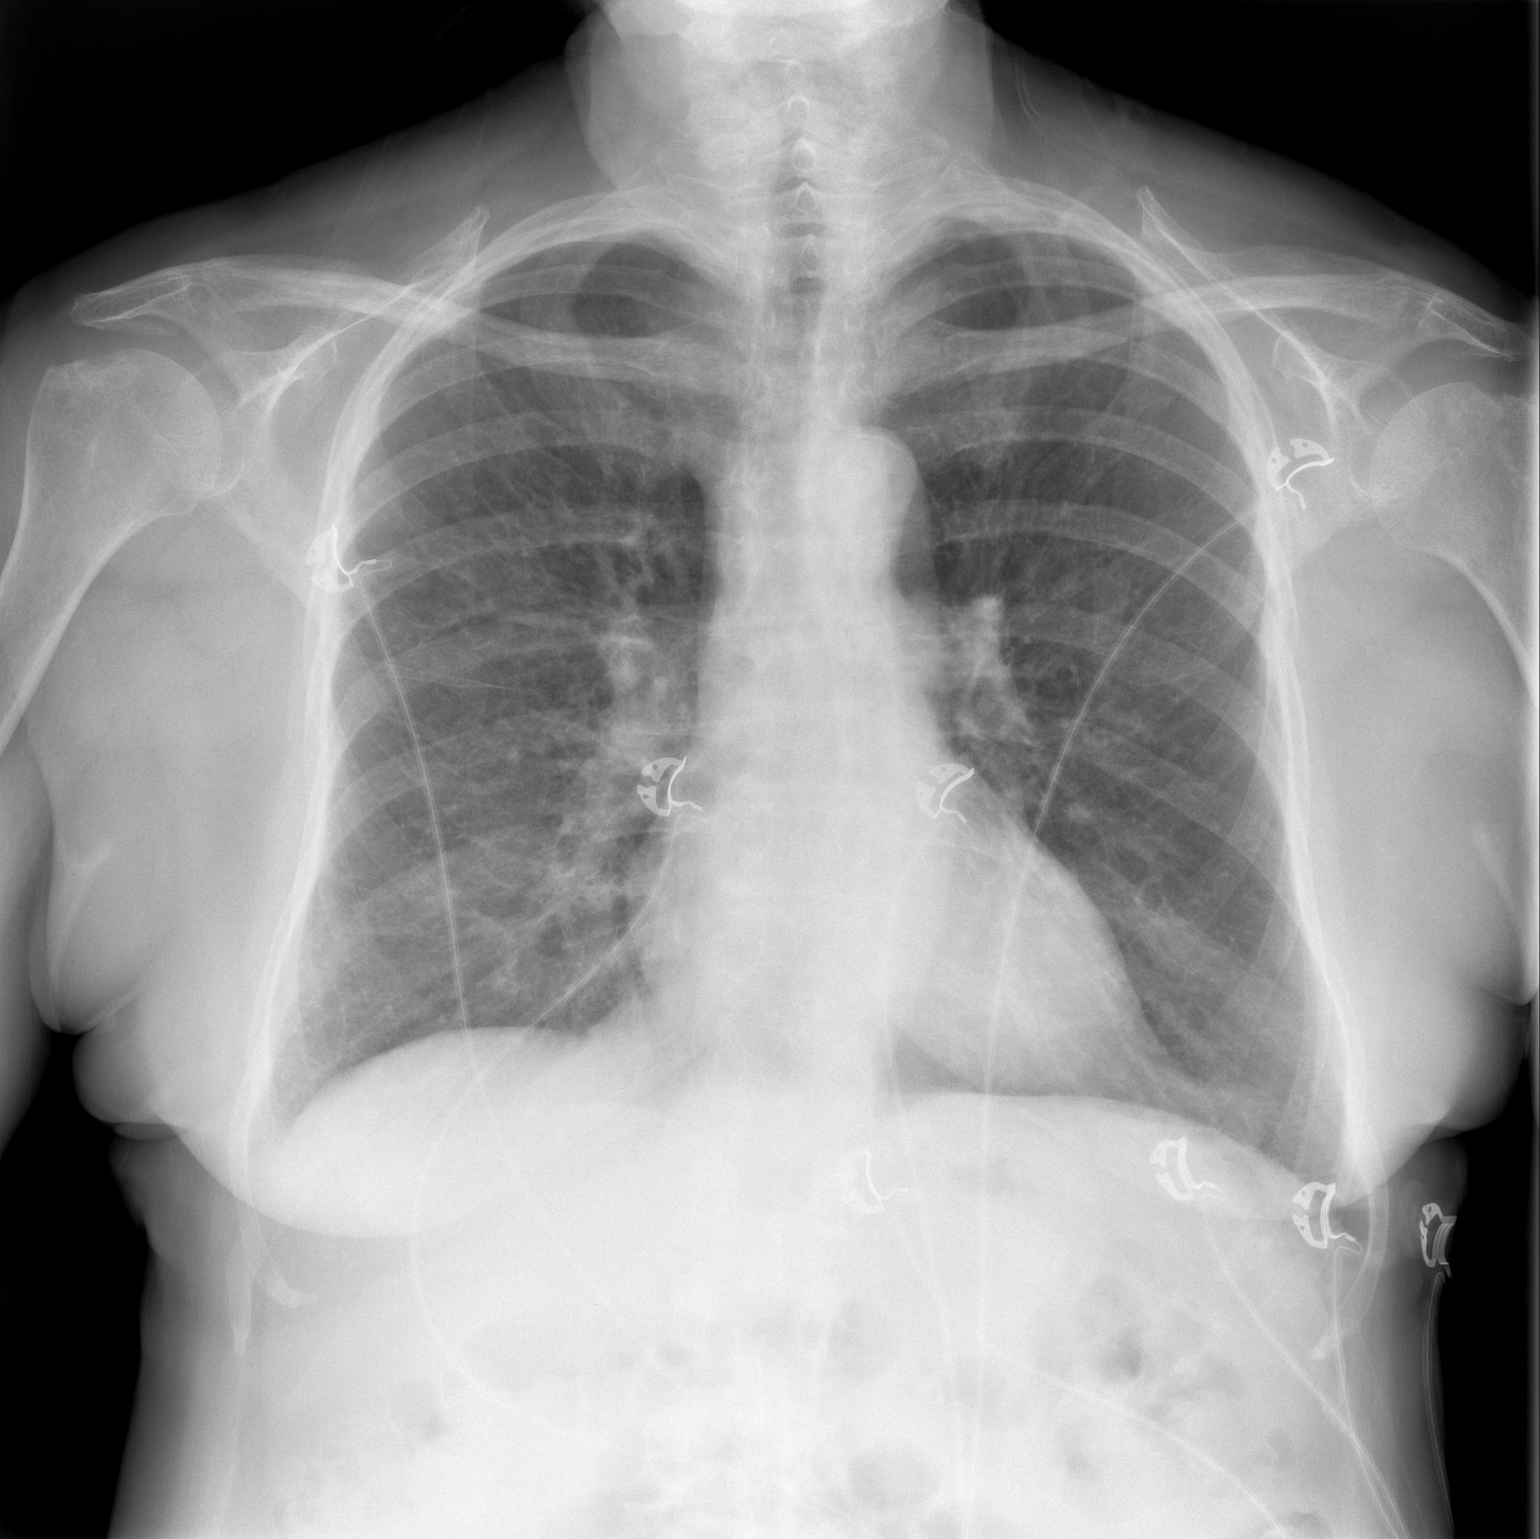

[w chest lat]
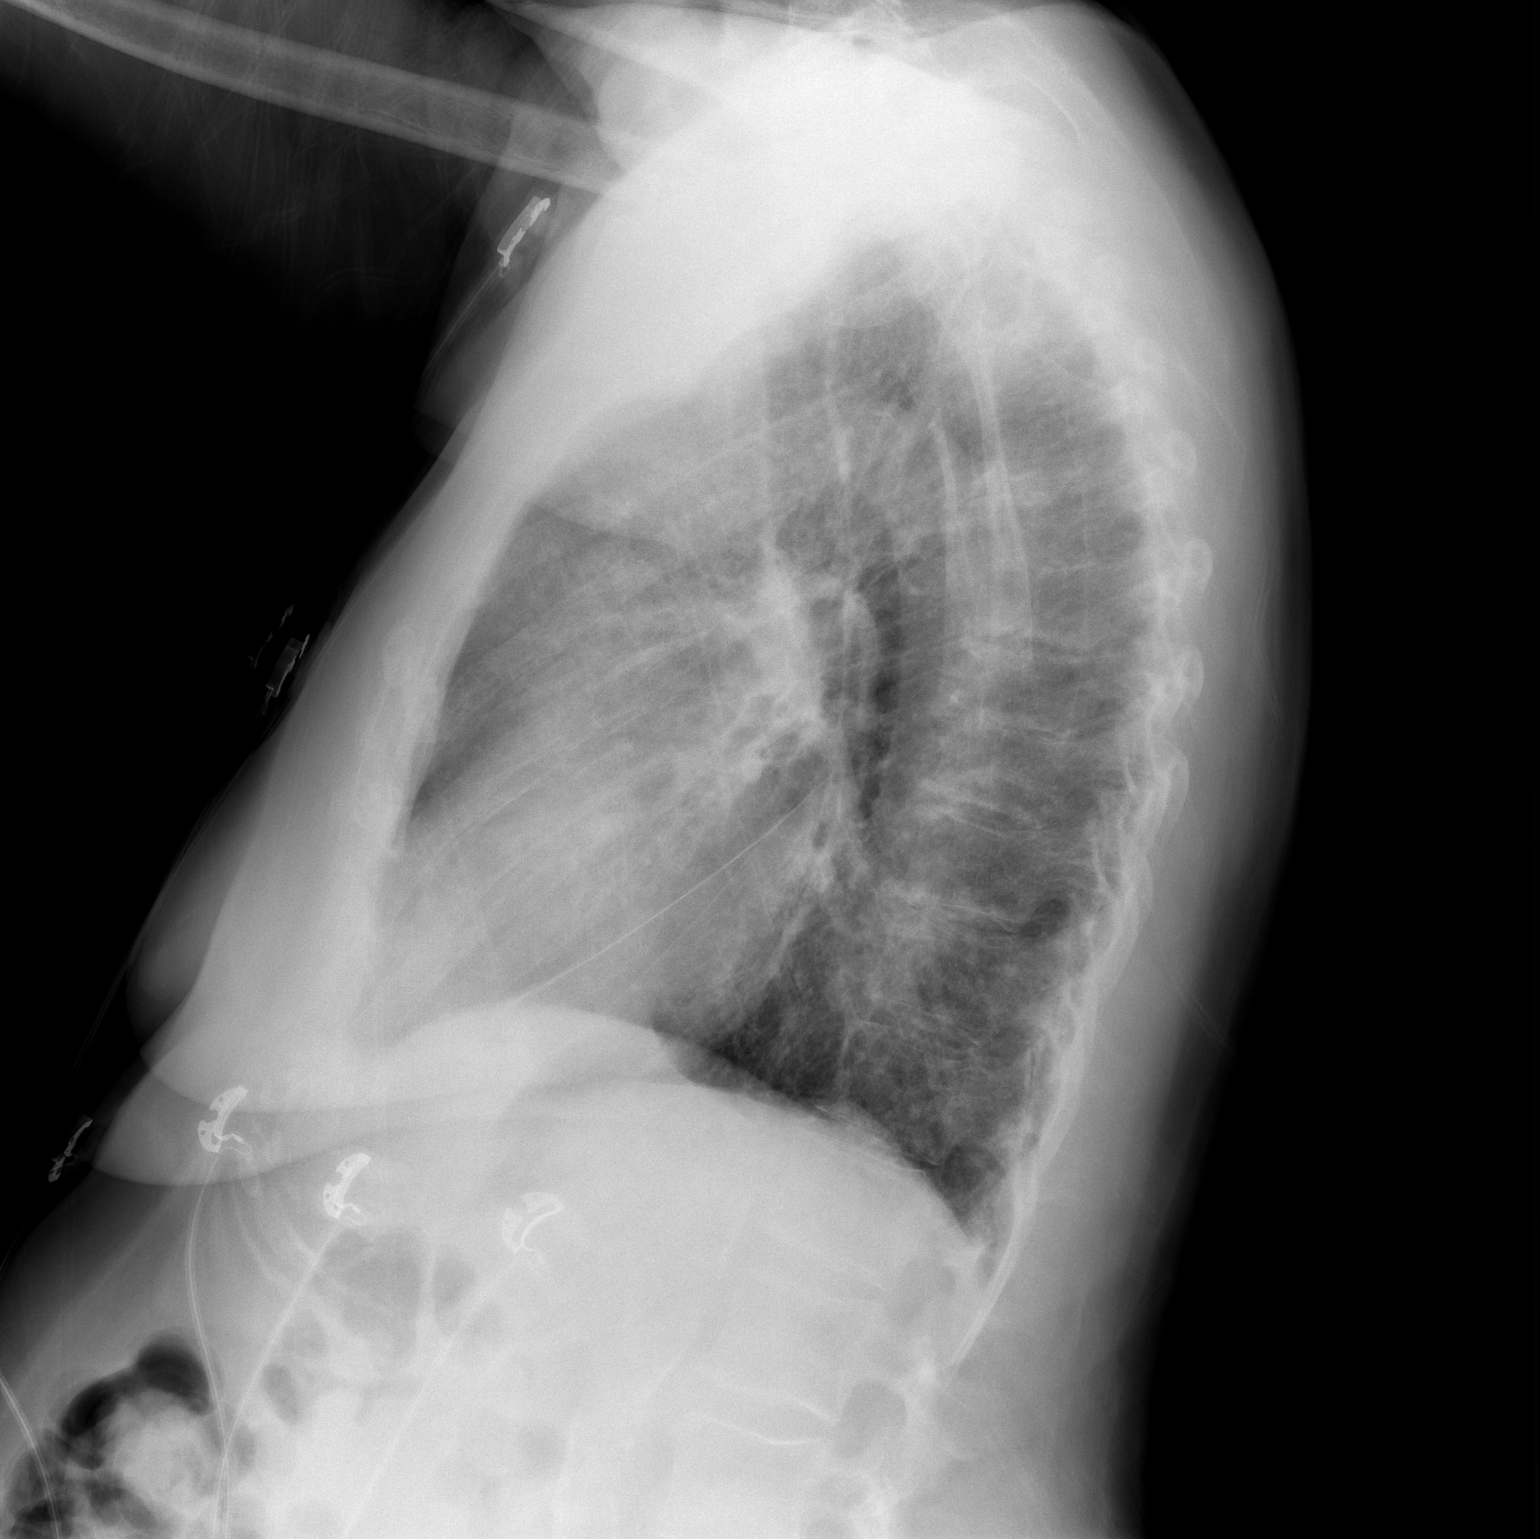

[2 of 2 positions shown; findings below may reference images not displayed]

FINDINGS: Diffuse interstitial coarsening and chronic bronchitic changes.
Faint areas of density primarily involving the lower lung fields may
represent atelectasis or infiltrate. Clinical correlation is
recommended. No lobar consolidation, pleural effusion, or
pneumothorax. The cardiac silhouette is within normal limits.
Atherosclerotic calcification of the aorta. Degenerative changes of
the spine. No acute osseous pathology.
IMPRESSION: Bibasilar atelectasis versus developing infiltrate.
